# Patient Record
Sex: Female | Born: 1950
Health system: Southern US, Community
[De-identification: ages and names within clinical notes are randomized; demographics above are authoritative.]

## PROBLEM LIST (undated history)

## (undated) DIAGNOSIS — I2510 Atherosclerotic heart disease of native coronary artery without angina pectoris: Secondary | ICD-10-CM

## (undated) DIAGNOSIS — E785 Hyperlipidemia, unspecified: Secondary | ICD-10-CM

## (undated) DIAGNOSIS — Z720 Tobacco use: Secondary | ICD-10-CM

## (undated) DIAGNOSIS — I10 Essential (primary) hypertension: Secondary | ICD-10-CM

## (undated) DIAGNOSIS — I2119 ST elevation (STEMI) myocardial infarction involving other coronary artery of inferior wall: Secondary | ICD-10-CM

## (undated) DIAGNOSIS — Z951 Presence of aortocoronary bypass graft: Secondary | ICD-10-CM

## (undated) DIAGNOSIS — I5032 Chronic diastolic (congestive) heart failure: Secondary | ICD-10-CM

## (undated) DIAGNOSIS — K635 Polyp of colon: Secondary | ICD-10-CM

## (undated) DIAGNOSIS — E119 Type 2 diabetes mellitus without complications: Secondary | ICD-10-CM

## (undated) HISTORY — DX: ST elevation (STEMI) myocardial infarction involving other coronary artery of inferior wall: I21.19

## (undated) HISTORY — DX: Tobacco use: Z72.0

## (undated) HISTORY — DX: Presence of aortocoronary bypass graft: Z95.1

## (undated) HISTORY — DX: Hyperlipidemia, unspecified: E78.5

## (undated) HISTORY — DX: Chronic diastolic (congestive) heart failure: I50.32

## (undated) HISTORY — PX: ABDOMINAL HYSTERECTOMY: SHX81

## (undated) HISTORY — DX: Atherosclerotic heart disease of native coronary artery without angina pectoris: I25.10

## (undated) HISTORY — DX: Essential (primary) hypertension: I10

---

## 2002-10-24 ENCOUNTER — Other Ambulatory Visit: Admission: RE | Admit: 2002-10-24 | Discharge: 2002-10-24 | Payer: Self-pay | Admitting: Family Medicine

## 2004-09-05 ENCOUNTER — Encounter: Admission: RE | Admit: 2004-09-05 | Discharge: 2004-09-05 | Payer: Self-pay | Admitting: Occupational Medicine

## 2006-10-02 ENCOUNTER — Encounter: Admission: RE | Admit: 2006-10-02 | Discharge: 2006-10-02 | Payer: Self-pay | Admitting: Family Medicine

## 2016-11-27 ENCOUNTER — Encounter (HOSPITAL_COMMUNITY)
Admission: EM | Disposition: A | Discharge status: Home Health | Payer: Self-pay | Source: Home / Self Care | Attending: Cardiology

## 2016-11-27 ENCOUNTER — Inpatient Hospital Stay (HOSPITAL_COMMUNITY)
Admission: EM | Admit: 2016-11-27 | Discharge: 2016-12-08 | DRG: 233 | Disposition: A | Discharge status: Home Health | Payer: Medicaid Other | Attending: Thoracic Surgery (Cardiothoracic Vascular Surgery) | Admitting: Thoracic Surgery (Cardiothoracic Vascular Surgery)

## 2016-11-27 ENCOUNTER — Encounter (HOSPITAL_COMMUNITY): Payer: Self-pay

## 2016-11-27 ENCOUNTER — Emergency Department (HOSPITAL_COMMUNITY): Payer: Medicaid Other

## 2016-11-27 DIAGNOSIS — Z90710 Acquired absence of both cervix and uterus: Secondary | ICD-10-CM

## 2016-11-27 DIAGNOSIS — N951 Menopausal and female climacteric states: Secondary | ICD-10-CM | POA: Diagnosis present

## 2016-11-27 DIAGNOSIS — J9811 Atelectasis: Secondary | ICD-10-CM | POA: Diagnosis not present

## 2016-11-27 DIAGNOSIS — E785 Hyperlipidemia, unspecified: Secondary | ICD-10-CM

## 2016-11-27 DIAGNOSIS — Z8249 Family history of ischemic heart disease and other diseases of the circulatory system: Secondary | ICD-10-CM

## 2016-11-27 DIAGNOSIS — Z791 Long term (current) use of non-steroidal anti-inflammatories (NSAID): Secondary | ICD-10-CM | POA: Diagnosis not present

## 2016-11-27 DIAGNOSIS — Z86010 Personal history of colonic polyps: Secondary | ICD-10-CM

## 2016-11-27 DIAGNOSIS — K219 Gastro-esophageal reflux disease without esophagitis: Secondary | ICD-10-CM | POA: Diagnosis present

## 2016-11-27 DIAGNOSIS — I472 Ventricular tachycardia: Secondary | ICD-10-CM | POA: Diagnosis not present

## 2016-11-27 DIAGNOSIS — F17210 Nicotine dependence, cigarettes, uncomplicated: Secondary | ICD-10-CM | POA: Diagnosis not present

## 2016-11-27 DIAGNOSIS — I5032 Chronic diastolic (congestive) heart failure: Secondary | ICD-10-CM

## 2016-11-27 DIAGNOSIS — I25110 Atherosclerotic heart disease of native coronary artery with unstable angina pectoris: Secondary | ICD-10-CM | POA: Diagnosis not present

## 2016-11-27 DIAGNOSIS — E7800 Pure hypercholesterolemia, unspecified: Secondary | ICD-10-CM | POA: Diagnosis present

## 2016-11-27 DIAGNOSIS — J90 Pleural effusion, not elsewhere classified: Secondary | ICD-10-CM | POA: Diagnosis present

## 2016-11-27 DIAGNOSIS — N390 Urinary tract infection, site not specified: Secondary | ICD-10-CM | POA: Diagnosis not present

## 2016-11-27 DIAGNOSIS — Z720 Tobacco use: Secondary | ICD-10-CM

## 2016-11-27 DIAGNOSIS — E669 Obesity, unspecified: Secondary | ICD-10-CM | POA: Diagnosis present

## 2016-11-27 DIAGNOSIS — E876 Hypokalemia: Secondary | ICD-10-CM | POA: Diagnosis not present

## 2016-11-27 DIAGNOSIS — Z951 Presence of aortocoronary bypass graft: Secondary | ICD-10-CM

## 2016-11-27 DIAGNOSIS — I5031 Acute diastolic (congestive) heart failure: Secondary | ICD-10-CM | POA: Diagnosis not present

## 2016-11-27 DIAGNOSIS — Z7982 Long term (current) use of aspirin: Secondary | ICD-10-CM

## 2016-11-27 DIAGNOSIS — J449 Chronic obstructive pulmonary disease, unspecified: Secondary | ICD-10-CM | POA: Diagnosis present

## 2016-11-27 DIAGNOSIS — M25569 Pain in unspecified knee: Secondary | ICD-10-CM | POA: Diagnosis present

## 2016-11-27 DIAGNOSIS — Z716 Tobacco abuse counseling: Secondary | ICD-10-CM | POA: Diagnosis not present

## 2016-11-27 DIAGNOSIS — M810 Age-related osteoporosis without current pathological fracture: Secondary | ICD-10-CM | POA: Diagnosis present

## 2016-11-27 DIAGNOSIS — Z6832 Body mass index (BMI) 32.0-32.9, adult: Secondary | ICD-10-CM

## 2016-11-27 DIAGNOSIS — I110 Hypertensive heart disease with heart failure: Secondary | ICD-10-CM | POA: Diagnosis not present

## 2016-11-27 DIAGNOSIS — I4510 Unspecified right bundle-branch block: Secondary | ICD-10-CM | POA: Diagnosis present

## 2016-11-27 DIAGNOSIS — I2510 Atherosclerotic heart disease of native coronary artery without angina pectoris: Secondary | ICD-10-CM

## 2016-11-27 DIAGNOSIS — I213 ST elevation (STEMI) myocardial infarction of unspecified site: Secondary | ICD-10-CM

## 2016-11-27 DIAGNOSIS — I509 Heart failure, unspecified: Secondary | ICD-10-CM

## 2016-11-27 DIAGNOSIS — I2119 ST elevation (STEMI) myocardial infarction involving other coronary artery of inferior wall: Secondary | ICD-10-CM | POA: Diagnosis not present

## 2016-11-27 DIAGNOSIS — D62 Acute posthemorrhagic anemia: Secondary | ICD-10-CM | POA: Diagnosis not present

## 2016-11-27 DIAGNOSIS — I10 Essential (primary) hypertension: Secondary | ICD-10-CM | POA: Clinically undetermined

## 2016-11-27 DIAGNOSIS — I080 Rheumatic disorders of both mitral and aortic valves: Secondary | ICD-10-CM | POA: Diagnosis present

## 2016-11-27 DIAGNOSIS — I219 Acute myocardial infarction, unspecified: Secondary | ICD-10-CM

## 2016-11-27 HISTORY — DX: ST elevation (STEMI) myocardial infarction involving other coronary artery of inferior wall: I21.19

## 2016-11-27 HISTORY — DX: Atherosclerotic heart disease of native coronary artery without angina pectoris: I25.10

## 2016-11-27 HISTORY — PX: TRANSTHORACIC ECHOCARDIOGRAM: SHX275

## 2016-11-27 HISTORY — DX: Essential (primary) hypertension: I10

## 2016-11-27 HISTORY — DX: Polyp of colon: K63.5

## 2016-11-27 HISTORY — PX: LEFT HEART CATH AND CORONARY ANGIOGRAPHY: CATH118249

## 2016-11-27 HISTORY — DX: Chronic diastolic (congestive) heart failure: I50.32

## 2016-11-27 HISTORY — DX: Hyperlipidemia, unspecified: E78.5

## 2016-11-27 LAB — COMPREHENSIVE METABOLIC PANEL
ALBUMIN: 3.5 g/dL (ref 3.5–5.0)
ALT: 12 U/L — ABNORMAL LOW (ref 14–54)
AST: 23 U/L (ref 15–41)
Alkaline Phosphatase: 98 U/L (ref 38–126)
Anion gap: 6 (ref 5–15)
BUN: 12 mg/dL (ref 6–20)
CALCIUM: 8.8 mg/dL — AB (ref 8.9–10.3)
CHLORIDE: 105 mmol/L (ref 101–111)
CO2: 24 mmol/L (ref 22–32)
Creatinine, Ser: 0.73 mg/dL (ref 0.44–1.00)
GFR calc Af Amer: >60 mL/min (ref >60)
GFR calc non Af Amer: >60 mL/min (ref >60)
GLUCOSE: 175 mg/dL — AB (ref 65–99)
POTASSIUM: 3.3 mmol/L — AB (ref 3.5–5.1)
SODIUM: 135 mmol/L (ref 135–145)
TOTAL PROTEIN: 7.1 g/dL (ref 6.5–8.1)
Total Bilirubin: 0.2 mg/dL — ABNORMAL LOW (ref 0.3–1.2)

## 2016-11-27 LAB — POCT I-STAT, CHEM 8
BUN: 12 mg/dL (ref 6–20)
CALCIUM ION: 1.22 mmol/L (ref 1.15–1.40)
CHLORIDE: 103 mmol/L (ref 101–111)
Creatinine, Ser: 0.6 mg/dL (ref 0.44–1.00)
GLUCOSE: 168 mg/dL — AB (ref 65–99)
HCT: 37 % (ref 36.0–46.0)
Hemoglobin: 12.6 g/dL (ref 12.0–15.0)
Potassium: 3.3 mmol/L — ABNORMAL LOW (ref 3.5–5.1)
Sodium: 141 mmol/L (ref 135–145)
TCO2: 25 mmol/L (ref 22–32)

## 2016-11-27 LAB — LIPID PANEL
Cholesterol: 173 mg/dL (ref 0–200)
HDL: 46 mg/dL (ref >40)
LDL CALC: 107 mg/dL — AB (ref 0–99)
TRIGLYCERIDES: 100 mg/dL (ref <150)
Total CHOL/HDL Ratio: 3.8 RATIO
VLDL: 20 mg/dL (ref 0–40)

## 2016-11-27 LAB — CBC WITH DIFFERENTIAL/PLATELET
Basophils Absolute: 0 10*3/uL (ref 0.0–0.1)
Basophils Relative: 0 %
Eosinophils Absolute: 0.3 10*3/uL (ref 0.0–0.7)
Eosinophils Relative: 2 %
HEMATOCRIT: 39.5 % (ref 36.0–46.0)
HEMOGLOBIN: 13 g/dL (ref 12.0–15.0)
LYMPHS ABS: 4.9 10*3/uL — AB (ref 0.7–4.0)
Lymphocytes Relative: 33 %
MCH: 27.8 pg (ref 26.0–34.0)
MCHC: 32.9 g/dL (ref 30.0–36.0)
MCV: 84.6 fL (ref 78.0–100.0)
MONOS PCT: 6 %
Monocytes Absolute: 0.9 10*3/uL (ref 0.1–1.0)
NEUTROS ABS: 8.8 10*3/uL — AB (ref 1.7–7.7)
NEUTROS PCT: 59 %
Platelets: 295 10*3/uL (ref 150–400)
RBC: 4.67 MIL/uL (ref 3.87–5.11)
RDW: 14.6 % (ref 11.5–15.5)
WBC: 14.9 10*3/uL — ABNORMAL HIGH (ref 4.0–10.5)

## 2016-11-27 LAB — APTT: APTT: 29 s (ref 24–36)

## 2016-11-27 LAB — TROPONIN I

## 2016-11-27 LAB — PROTIME-INR
INR: 1.02
Prothrombin Time: 13.3 seconds (ref 11.4–15.2)

## 2016-11-27 SURGERY — LEFT HEART CATH AND CORONARY ANGIOGRAPHY
Anesthesia: LOCAL

## 2016-11-27 MED ORDER — NITROGLYCERIN IN D5W 200-5 MCG/ML-% IV SOLN
INTRAVENOUS | Status: AC | PRN
  Administered 2016-11-27: 5 ug/min via INTRAVENOUS

## 2016-11-27 MED ORDER — POTASSIUM CHLORIDE 20 MEQ/15ML (10%) PO SOLN
40.0000 meq | Freq: Once | ORAL | Status: AC
  Administered 2016-11-28: 40 meq via ORAL
  Filled 2016-11-27 (×2): qty 30

## 2016-11-27 MED ORDER — BIVALIRUDIN BOLUS VIA INFUSION - CUPID: INTRAVENOUS | Status: DC | PRN

## 2016-11-27 MED ORDER — SODIUM CHLORIDE 0.9 % IV SOLN: INTRAVENOUS | Status: AC

## 2016-11-27 MED ORDER — TIROFIBAN (AGGRASTAT) BOLUS VIA INFUSION
INTRAVENOUS | Status: DC | PRN
  Administered 2016-11-27: 2087.5 ug via INTRAVENOUS

## 2016-11-27 MED ORDER — SODIUM CHLORIDE 0.9 % IV SOLN
INTRAVENOUS | Status: DC
  Administered 2016-11-27: 21:00:00 via INTRAVENOUS

## 2016-11-27 MED ORDER — MIDAZOLAM HCL 2 MG/2ML IJ SOLN
INTRAMUSCULAR | Status: AC
  Filled 2016-11-27: qty 2

## 2016-11-27 MED ORDER — SODIUM CHLORIDE 0.9% FLUSH
3.0000 mL | Freq: Two times a day (BID) | INTRAVENOUS | Status: DC
  Administered 2016-11-28 – 2016-11-30 (×5): 3 mL via INTRAVENOUS

## 2016-11-27 MED ORDER — HEPARIN (PORCINE) IN NACL 100-0.45 UNIT/ML-% IJ SOLN
1350.0000 [IU]/h | INTRAMUSCULAR | Status: DC
  Administered 2016-11-28: 1200 [IU]/h via INTRAVENOUS
  Administered 2016-11-28: 1350 [IU]/h via INTRAVENOUS
  Filled 2016-11-27 (×4): qty 250

## 2016-11-27 MED ORDER — HEPARIN SODIUM (PORCINE) 5000 UNIT/ML IJ SOLN
INTRAMUSCULAR | Status: AC
  Filled 2016-11-27: qty 1

## 2016-11-27 MED ORDER — ONDANSETRON HCL 4 MG/2ML IJ SOLN
4.0000 mg | Freq: Once | INTRAMUSCULAR | Status: AC
  Administered 2016-11-27: 4 mg via INTRAVENOUS

## 2016-11-27 MED ORDER — ONDANSETRON HCL 4 MG/2ML IJ SOLN
INTRAMUSCULAR | Status: AC
  Filled 2016-11-27: qty 2

## 2016-11-27 MED ORDER — TIROFIBAN HCL IN NACL 5-0.9 MG/100ML-% IV SOLN
INTRAVENOUS | Status: AC
  Filled 2016-11-27: qty 100

## 2016-11-27 MED ORDER — LIDOCAINE HCL (PF) 1 % IJ SOLN
INTRAMUSCULAR | Status: DC | PRN
  Administered 2016-11-27: 2 mL

## 2016-11-27 MED ORDER — ASPIRIN 81 MG PO CHEW
81.0000 mg | CHEWABLE_TABLET | Freq: Every day | ORAL | Status: DC
  Administered 2016-11-28 – 2016-12-01 (×4): 81 mg via ORAL
  Filled 2016-11-27 (×4): qty 1

## 2016-11-27 MED ORDER — ATORVASTATIN CALCIUM 80 MG PO TABS
80.0000 mg | ORAL_TABLET | Freq: Every day | ORAL | Status: DC
  Administered 2016-11-28 – 2016-12-07 (×9): 80 mg via ORAL
  Filled 2016-11-27 (×9): qty 1

## 2016-11-27 MED ORDER — VERAPAMIL HCL 2.5 MG/ML IV SOLN
INTRAVENOUS | Status: DC | PRN
  Administered 2016-11-27: 10 mL via INTRA_ARTERIAL

## 2016-11-27 MED ORDER — FENTANYL CITRATE (PF) 100 MCG/2ML IJ SOLN
INTRAMUSCULAR | Status: AC
  Filled 2016-11-27: qty 2

## 2016-11-27 MED ORDER — SODIUM CHLORIDE 0.9 % IV SOLN: 250.0000 mL | INTRAVENOUS | Status: DC | PRN

## 2016-11-27 MED ORDER — SODIUM CHLORIDE 0.9% FLUSH: 3.0000 mL | INTRAVENOUS | Status: DC | PRN

## 2016-11-27 MED ORDER — IOPAMIDOL (ISOVUE-370) INJECTION 76%
INTRAVENOUS | Status: AC
  Filled 2016-11-27: qty 100

## 2016-11-27 MED ORDER — NITROGLYCERIN IN D5W 200-5 MCG/ML-% IV SOLN
0.0000 ug/min | INTRAVENOUS | Status: DC
  Administered 2016-11-28: 90 ug/min via INTRAVENOUS
  Administered 2016-11-28: 100 ug/min via INTRAVENOUS
  Administered 2016-11-29: 80 ug/min via INTRAVENOUS
  Administered 2016-11-29: 70 ug/min via INTRAVENOUS
  Administered 2016-11-30 (×2): 80 ug/min via INTRAVENOUS
  Filled 2016-11-27 (×6): qty 250

## 2016-11-27 MED ORDER — HEPARIN (PORCINE) IN NACL 2-0.9 UNIT/ML-% IJ SOLN
INTRAMUSCULAR | Status: AC
  Filled 2016-11-27: qty 1000

## 2016-11-27 MED ORDER — NITROGLYCERIN IN D5W 200-5 MCG/ML-% IV SOLN
INTRAVENOUS | Status: AC
  Filled 2016-11-27: qty 250

## 2016-11-27 MED ORDER — FENTANYL CITRATE (PF) 100 MCG/2ML IJ SOLN
INTRAMUSCULAR | Status: DC | PRN
  Administered 2016-11-27: 50 ug via INTRAVENOUS

## 2016-11-27 MED ORDER — IOPAMIDOL (ISOVUE-370) INJECTION 76%
INTRAVENOUS | Status: AC
  Filled 2016-11-27: qty 125

## 2016-11-27 MED ORDER — ACETAMINOPHEN 325 MG PO TABS
650.0000 mg | ORAL_TABLET | ORAL | Status: DC | PRN
  Administered 2016-11-29 – 2016-12-01 (×4): 650 mg via ORAL
  Filled 2016-11-27 (×4): qty 2

## 2016-11-27 MED ORDER — CARVEDILOL 3.125 MG PO TABS
3.1250 mg | ORAL_TABLET | Freq: Two times a day (BID) | ORAL | Status: DC
  Administered 2016-11-28: 3.125 mg via ORAL
  Filled 2016-11-27: qty 1

## 2016-11-27 MED ORDER — FENTANYL CITRATE (PF) 100 MCG/2ML IJ SOLN
50.0000 ug | Freq: Once | INTRAMUSCULAR | Status: AC
  Administered 2016-11-27: 50 ug via INTRAVENOUS

## 2016-11-27 MED ORDER — VERAPAMIL HCL 2.5 MG/ML IV SOLN
INTRAVENOUS | Status: AC
  Filled 2016-11-27: qty 2

## 2016-11-27 MED ORDER — IOPAMIDOL (ISOVUE-370) INJECTION 76%
INTRAVENOUS | Status: DC | PRN
  Administered 2016-11-27: 75 mL via INTRAVENOUS

## 2016-11-27 MED ORDER — HEPARIN (PORCINE) IN NACL 2-0.9 UNIT/ML-% IJ SOLN
INTRAMUSCULAR | Status: AC | PRN
Start: 2016-11-27 — End: 2016-11-27
  Administered 2016-11-27: 1500 mL

## 2016-11-27 MED ORDER — TIROFIBAN HCL IN NACL 5-0.9 MG/100ML-% IV SOLN
INTRAVENOUS | Status: AC | PRN
  Administered 2016-11-27: 0.15 ug/kg/min via INTRAVENOUS

## 2016-11-27 MED ORDER — HEPARIN SODIUM (PORCINE) 5000 UNIT/ML IJ SOLN
60.0000 [IU]/kg | Freq: Once | INTRAMUSCULAR | Status: AC
  Administered 2016-11-27: 4000 [IU] via INTRAVENOUS

## 2016-11-27 MED ORDER — TIROFIBAN HCL IN NACL 5-0.9 MG/100ML-% IV SOLN
0.1500 ug/kg/min | INTRAVENOUS | Status: AC
  Administered 2016-11-28 (×3): 0.15 ug/kg/min via INTRAVENOUS
  Filled 2016-11-27 (×3): qty 100

## 2016-11-27 MED ORDER — HEPARIN (PORCINE) IN NACL 2-0.9 UNIT/ML-% IJ SOLN
INTRAMUSCULAR | Status: AC
  Filled 2016-11-27: qty 500

## 2016-11-27 MED ORDER — FUROSEMIDE 10 MG/ML IJ SOLN
40.0000 mg | Freq: Once | INTRAMUSCULAR | Status: AC
  Administered 2016-11-28: 40 mg via INTRAVENOUS
  Filled 2016-11-27: qty 4

## 2016-11-27 MED ORDER — LIDOCAINE HCL 2 % IJ SOLN
INTRAMUSCULAR | Status: AC
  Filled 2016-11-27: qty 10

## 2016-11-27 MED ORDER — NITROGLYCERIN 1 MG/10 ML FOR IR/CATH LAB
INTRA_ARTERIAL | Status: AC
  Filled 2016-11-27: qty 10

## 2016-11-27 MED ORDER — ONDANSETRON HCL 4 MG/2ML IJ SOLN
4.0000 mg | Freq: Four times a day (QID) | INTRAMUSCULAR | Status: DC | PRN
  Administered 2016-11-28 – 2016-11-30 (×2): 4 mg via INTRAVENOUS
  Filled 2016-11-27 (×2): qty 2

## 2016-11-27 MED ORDER — MIDAZOLAM HCL 2 MG/2ML IJ SOLN
INTRAMUSCULAR | Status: DC | PRN
  Administered 2016-11-27: 1 mg via INTRAVENOUS

## 2016-11-27 SURGICAL SUPPLY — 12 items
CATH EXPO 5F FL3.5 (CATHETERS) ×2 IMPLANT
CATH INFINITI 5FR ANG PIGTAIL (CATHETERS) ×2 IMPLANT
CATH OPTITORQUE TIG 4.0 5F (CATHETERS) ×2 IMPLANT
DEVICE RAD COMP TR BAND LRG (VASCULAR PRODUCTS) ×2 IMPLANT
GLIDESHEATH SLEND A-KIT 6F 22G (SHEATH) ×2 IMPLANT
GUIDEWIRE INQWIRE 1.5J.035X260 (WIRE) ×1 IMPLANT
INQWIRE 1.5J .035X260CM (WIRE) ×2
KIT ENCORE 26 ADVANTAGE (KITS) ×2 IMPLANT
KIT HEART LEFT (KITS) ×2 IMPLANT
PACK CARDIAC CATHETERIZATION (CUSTOM PROCEDURE TRAY) ×2 IMPLANT
TRANSDUCER W/STOPCOCK (MISCELLANEOUS) ×2 IMPLANT
TUBING CIL FLEX 10 FLL-RA (TUBING) ×2 IMPLANT

## 2016-11-27 NOTE — ED Notes (Signed)
EKG in going from elevation to no elevation while cardiology speaking with pt.

## 2016-11-27 NOTE — ED Provider Notes (Signed)
[facility] DEPT Provider Note   CSN: [id number] Arrival date & time: 11/27/16  2109     History   Chief Complaint Chief Complaint  Patient presents with  . Chest Pain    HPI [name] is a 66 y.o. female.  This is a 66 year old female who denies any PMH and does not see a doctor who presents with sudden onset of precordial chest pain which began during her drive home from work today approximately 3-4 hours earlier.  EMS was called and they gave 324 aspirin and one nitroglycerin which made her more nauseous.  She endorses shortness of breath, nonradiating chest pain, diaphoresis.  Initial EKG apparently showed nonspecific repolarization abnormalities however upon arrival to the ED her EKG showed ST segment elevation in inferior leads concerning for STEMI. Patient endorses 1 pack per day of smoking, denies illicit drug use, denies recent travel, leg swelling, neurological deficits.   The history is provided by the patient and the EMS personnel. The history is limited by the condition of the patient.    History reviewed. No pertinent past medical history.  Patient Active Problem List   Diagnosis Date Noted  . Acute ST elevation myocardial infarction (STEMI) of inferior wall ([place]) 11/27/2016    History reviewed. No pertinent surgical history.  OB History    No data available       Home Medications    Prior to Admission medications   Not on File    Family History History reviewed. No pertinent family history.  Social History Social History  Substance Use Topics  . Smoking status: Not on file  . Smokeless tobacco: Not on file  . Alcohol use Not on file     Allergies   Patient has no known allergies.   Review of Systems Review of Systems  Unable to perform ROS: Acuity of condition     Physical Exam Updated Vital Signs BP 122/76   Pulse 83   Temp (!) 96.1 F (35.6 C) (Temporal)   Resp 15   Wt 83.5 kg (184 lb)   SpO2 93%   Physical Exam   Constitutional: She appears well-developed and well-nourished. She has a sickly appearance. She appears distressed. Nasal cannula in place.  HENT:  Head: Normocephalic and atraumatic.  Eyes: Conjunctivae are normal.  Neck: Neck supple.  Cardiovascular: Normal rate, regular rhythm, normal heart sounds and intact distal pulses.   No murmur heard. Pulmonary/Chest: Effort normal and breath sounds normal. No respiratory distress.  Abdominal: Soft. There is no tenderness.  Musculoskeletal: She exhibits no edema.  Neurological: She is alert. She has normal strength. No sensory deficit.  Skin: Skin is warm. She is diaphoretic.  Psychiatric: Her mood appears anxious.  Nursing note and vitals reviewed.    ED Treatments / Results  Labs (all labs ordered are listed, but only abnormal results are displayed) Labs Reviewed  CBC WITH DIFFERENTIAL/PLATELET  PROTIME-INR  APTT  COMPREHENSIVE METABOLIC PANEL  TROPONIN I  LIPID PANEL    EKG  EKG Interpretation None       Radiology No results found.  Procedures Procedures (including critical care time)  Medications Ordered in ED Medications  0.9 %  sodium chloride infusion ( Intravenous New Bag/Given 11/27/16 2116)  heparin 5000 UNIT/ML injection (not administered)  fentaNYL (SUBLIMAZE) 100 MCG/2ML injection (not administered)  ondansetron (ZOFRAN) 4 MG/2ML injection (not administered)  heparin injection 60 Units/kg (4,000 Units Intravenous Given 11/27/16 2117)  ondansetron (ZOFRAN) injection 4 mg (4 mg Intravenous Given 11/27/16  2118)  fentaNYL (SUBLIMAZE) injection 50 mcg (50 mcg Intravenous Given 11/27/16 2118)     Initial Impression / Assessment and Plan / ED Course  I have reviewed the triage vital signs and the nursing notes.  Pertinent labs & imaging results that were available during my care of the patient were reviewed by me and considered in my medical decision making (see chart for details).     This is a 66 year old  female who denies any PMH and does not see a doctor who presents with sudden onset of precordial chest pain which began during her drive home from work today approximately 3-4 hours earlier.    Repeat 12-lead EKG reviewed upon arrival in the ED which concerning for acute STEMI with elevation in leads II, 3, aVF with reciprocal depression in aVL, V2. Mild prolonging the PR interval, no evidence of third degree heart block.  Heparin bolus started, 1 L saline started.  Cardiology consulted and patient was emergently taken to Cath lab.   Spoke with son on arrival and all questions answered.  Final Clinical Impressions(s) / ED Diagnoses   Final diagnoses:  None    New Prescriptions New Prescriptions   No medications on file     Aldona Lento, MD 11/27/16 2236    Little, Wenda Overland, MD 11/28/16 [phone]

## 2016-11-27 NOTE — H&P (Signed)
Patient ID: [name] MRN: [id number], DOB/AGE: [birth year]   Admit date: 11/27/2016   Primary Physician: No primary care provider on file. Primary Cardiologist: None  Pt. Profile: 66 yo female w/ no significant medical history who presents with inferior STEMI, found to have 2 vessel disease.  Problem List  Past Medical History:  Diagnosis Date  . Colon polyps     Past Surgical History:  Procedure Laterality Date  . ABDOMINAL HYSTERECTOMY       Allergies  No Known Allergies  HPI [name] was in her usual state of health when she was returning from work in her car. She suddenly felt nauseous, weak with sweating and some chest pain. EMS was called and they gave her an ASA and SLN. The EKG strips showed inferior ST elevation, that resolved when she arrived to the hospital.  She reports being able to walk up a flight of stairs, only limited by her knee pain. No PND or orthopnea. No prior history of MI, cardiac surgery. Smokes 1ppd x 42 years   Home Medications  Prior to Admission medications   Not on File    Family History  Family History  Problem Relation Age of Onset  . Heart disease Brother     Social History  Social History   Social History  . Marital status: Unknown    Spouse name: N/A  . Number of children: N/A  . Years of education: N/A   Occupational History  . Not on file.   Social History Main Topics  . Smoking status: Current Every Day Smoker    Packs/day: 1.00    Years: 42.00    Types: Cigarettes  . Smokeless tobacco: Never Used  . Alcohol use No  . Drug use: No  . Sexual activity: Not on file   Other Topics Concern  . Not on file   Social History Narrative  . No narrative on file     Review of Systems General:  No chills, fever, night sweats or weight changes.  Cardiovascular:  No chest pain, dyspnea on exertion, edema, orthopnea, palpitations, paroxysmal nocturnal dyspnea. Dermatological: No rash,  lesions/masses Respiratory: No cough, dyspnea Urologic: No hematuria, dysuria Abdominal:   No nausea, vomiting, diarrhea, bright red blood per rectum, melena, or hematemesis Neurologic:  No visual changes, wkns, changes in mental status. All other systems reviewed and are otherwise negative except as noted above.  Physical Exam  Blood pressure (!) 153/99, pulse 85, temperature (!) 96.1 F (35.6 C), temperature source Temporal, resp. rate 19, height 5\' 7"  (1.702 m), weight 93.7 kg (206 lb 9.1 oz), SpO2 95 %.  General: Pleasant, NAD Psych: Normal affect. Neuro: Alert and oriented X 3. Moves all extremities spontaneously. HEENT: Normal  Neck: Supple without bruits or JVD. Lungs:  Resp regular and unlabored, CTA. Heart: RRR no s3, s4, II/VI systolic murmur at RUSB Abdomen: Soft, non-tender, non-distended, BS + x 4.  Extremities: No clubbing, cyanosis or edema. DP/PT/Radials 2+ and equal bilaterally.  Labs  Troponin (Point of Care Test) No results for input(s): TROPIPOC in the last 72 hours.  Recent Labs  11/27/16 2115  TROPONINI <0.03   Lab Results  Component Value Date   WBC 14.9 (H) 11/27/2016   HGB 12.6 11/27/2016   HCT 37.0 11/27/2016   MCV 84.6 11/27/2016   PLT 295 11/27/2016    Recent Labs Lab 11/27/16 2115 11/27/16 2202  NA 135 141  K 3.3* 3.3*  CL 105 103  CO2  24  --   BUN 12 12  CREATININE 0.73 0.60  CALCIUM 8.8*  --   PROT 7.1  --   BILITOT 0.2*  --   ALKPHOS 98  --   ALT 12*  --   AST 23  --   GLUCOSE 175* 168*   Lab Results  Component Value Date   CHOL 173 11/27/2016   HDL 46 11/27/2016   LDLCALC 107 (H) 11/27/2016   TRIG 100 11/27/2016   No results found for: DDIMER   Radiology/Studies  Dg Chest Port 1 View  Result Date: 11/27/2016 CLINICAL DATA:  STEMI; chest pain, N/V EXAM: PORTABLE CHEST 1 VIEW COMPARISON:  None. FINDINGS: Cardiomegaly. Lungs are clear. No pleural effusion or pneumothorax seen. Osseous structures about the chest are  unremarkable. IMPRESSION: 1. Cardiomegaly. 2. Lungs are clear.  No evidence of CHF. Electronically Signed   By: Franki Cabot M.D.   On: 11/27/2016 21:29    ECG Inferior STEMI, resolved on repeat  Echocardiogram  Pending    LHC:   Prox RCA-2 lesion, 60 %stenosed. Prox-mid RCA-1 lesion, 55 %stenosed.   Dist RCA-2 lesion, 95 %stenosed. Dist RCA-1 lesion, 90 %stenosed. - This combination is likely culprit lesion, however with very tortuous RCA in a stable patient did not feel it was a good idea to attempt PTCA at this time.   Ost LAD lesion, 65 %stenosed. Mid LAD lesion, 90 %stenosed.   Ost 1st Diag to 1st Diag lesion, 50 %stenosed. 1st Diag lesion, 60 %stenosed.   Dist LAD lesion, 50 %stenosed. - This is beyond the 2nd Diag branch.   2nd Mrg lesion, 60 %stenosed.   The left ventricular systolic function is normal. The left ventricular ejection fraction is 55-65% by visual estimate.   LV end diastolic pressure is severely elevated. 32-35 mmhg   There is moderate (3+) mitral regurgitation.  ASSESSMENT AND PLAN 66 yo female w/ history of tobacco use who presented with a inferior STEMI with resolution of ST segment on repeat  # Inferior ST: on presentation, found to have at least 2 vessel disease not immediately amendable to PCI. Risk factors include smoking and family history (reports heart disease in relatively young family members) - aggrastat x 18 hours - restart heparin gtt after 8hrs from sheath removal - lasix 40mg  IV x 1 for LVEDP >30 - Nitro gtt  - Coreg 3.125mg  - Atorva 80mg  - start ACE-I tomorrow - consult CT surgery for evaluation - f/u lipid + A1c - TTE  # Tobacco use - will need counseling   Signed, Charlies Silvers, MD

## 2016-11-27 NOTE — ED Triage Notes (Signed)
Pt on way from work and feeling bad, pulled over and called 911, sob and cp, non radiating, diaphoretic, speaking in complete sentences, initial 12 lead bundle branch, given 324 asa and 1 ntg with ems. Emesis x 1 pta by ems and 1 when pulling in to ed. 12 lead on arrival to ed shows elevation. 129/79, 78, 99% 2 l, rr16

## 2016-11-27 NOTE — Progress Notes (Signed)
   11/27/16 2200  Clinical Encounter Type  Visited With Patient  Visit Type Code  Referral From Nurse  Consult/Referral To Chaplain  Spiritual Encounters  Spiritual Needs Other (Comment) (initial visit with code Stemi)  Stress Factors  Patient Stress Factors Health changes

## 2016-11-27 NOTE — ED Notes (Signed)
1 bag of clothing and pocket book with pt, pt glasses placed in pocket book

## 2016-11-27 NOTE — Progress Notes (Signed)
ANTICOAGULATION CONSULT NOTE - Initial Consult  Pharmacy Consult for Heparin Indication: post-cath  No Known Allergies  Patient Measurements: Weight: 184 lb (83.5 kg) Heparin Dosing Weight:   Vital Signs: Temp: 96.1 F (35.6 C) (09/20 2121) Temp Source: Temporal (09/20 2121) BP: 135/70 (09/20 2239) Pulse Rate: 84 (09/20 2239)  Labs:  Recent Labs  11/27/16 2115  HGB 13.0  HCT 39.5  PLT 295  APTT 29  LABPROT 13.3  INR 1.02  CREATININE 0.73  TROPONINI <0.03    CrCl cannot be calculated (Unknown ideal weight.).   Medical History: History reviewed. No pertinent past medical history.   Assessment:  CC/HPI: CP>STEMI  PMH: tobacco,  Anticoag: STEMI. Heparin post-cath. Radial sheath removed 2232. Baseline CBC and INR WNL.  Goal of Therapy:  Heparin level 0.3-0.7 units/ml Monitor platelets by anticoagulation protocol: Yes   Plan:  Obtain height /weight Start IV heparin in AM (0630) at 1200 units/hr-no bolus post-cath Check heparin level 6 hrs after heparin starts Daily HL and CBC  Jahzaria Vary S. Alford Highland, PharmD, BCPS Clinical Staff Pharmacist Pager [phone]  Eilene Ghazi Stillinger 11/27/2016,10:54 PM

## 2016-11-28 ENCOUNTER — Inpatient Hospital Stay (HOSPITAL_COMMUNITY): Payer: Medicaid Other

## 2016-11-28 ENCOUNTER — Encounter (HOSPITAL_COMMUNITY): Payer: Self-pay | Admitting: Cardiology

## 2016-11-28 ENCOUNTER — Other Ambulatory Visit: Payer: Self-pay | Admitting: *Deleted

## 2016-11-28 DIAGNOSIS — I5031 Acute diastolic (congestive) heart failure: Secondary | ICD-10-CM

## 2016-11-28 DIAGNOSIS — I350 Nonrheumatic aortic (valve) stenosis: Secondary | ICD-10-CM

## 2016-11-28 DIAGNOSIS — E7800 Pure hypercholesterolemia, unspecified: Secondary | ICD-10-CM

## 2016-11-28 DIAGNOSIS — I25110 Atherosclerotic heart disease of native coronary artery with unstable angina pectoris: Secondary | ICD-10-CM

## 2016-11-28 DIAGNOSIS — Z720 Tobacco use: Secondary | ICD-10-CM

## 2016-11-28 DIAGNOSIS — I2510 Atherosclerotic heart disease of native coronary artery without angina pectoris: Secondary | ICD-10-CM

## 2016-11-28 DIAGNOSIS — I10 Essential (primary) hypertension: Secondary | ICD-10-CM

## 2016-11-28 DIAGNOSIS — I213 ST elevation (STEMI) myocardial infarction of unspecified site: Secondary | ICD-10-CM

## 2016-11-28 LAB — SPIROMETRY WITH GRAPH
FEF 25-75 PRE: 0.77 L/s
FEF2575-%Pred-Pre: 36 %
FEV1-%Pred-Pre: 35 %
FEV1-PRE: 0.81 L
FEV1FVC-%Pred-Pre: 101 %
FEV6-%Pred-Pre: 36 %
FEV6-Pre: 1.01 L
FEV6FVC-%PRED-PRE: 103 %
FVC-%PRED-PRE: 34 %
FVC-Pre: 1.01 L
Pre FEV1/FVC ratio: 80 %
Pre FEV6/FVC Ratio: 100 %

## 2016-11-28 LAB — CBC
HEMATOCRIT: 37.5 % (ref 36.0–46.0)
HEMOGLOBIN: 12.1 g/dL (ref 12.0–15.0)
MCH: 27.2 pg (ref 26.0–34.0)
MCHC: 32.3 g/dL (ref 30.0–36.0)
MCV: 84.3 fL (ref 78.0–100.0)
Platelets: 297 10*3/uL (ref 150–400)
RBC: 4.45 MIL/uL (ref 3.87–5.11)
RDW: 14.6 % (ref 11.5–15.5)
WBC: 16.2 10*3/uL — AB (ref 4.0–10.5)

## 2016-11-28 LAB — ECHOCARDIOGRAM COMPLETE
AVPHT: 279 ms
Ao-asc: 40 cm
E decel time: 151 msec
FS: 33 % (ref 28–44)
Height: 67 in
IVS/LV PW RATIO, ED: 0.85
LA diam end sys: 32 mm
LA diam index: 1.56 cm/m2
LA vol A4C: 42.8 ml
LA vol index: 19.1 mL/m2
LA vol: 39.1 mL
LASIZE: 32 mm
LDCA: 3.8 cm2
LV TDI E'LATERAL: 7.29
LV TDI E'MEDIAL: 7.51
LV e' LATERAL: 7.29 cm/s
LVOT SV: 85 mL
LVOT VTI: 22.4 cm
LVOT peak grad rest: 7 mmHg
LVOTD: 22 mm
LVOTPV: 131 cm/s
MV Dec: 151
MV pk E vel: 0.9 m/s
PW: 13 mm — AB (ref 0.6–1.1)
RV LATERAL S' VELOCITY: 10.9 cm/s
RV TAPSE: 20.4 mm
Weight: 3305.14 oz

## 2016-11-28 LAB — BASIC METABOLIC PANEL
ANION GAP: 6 (ref 5–15)
BUN: 11 mg/dL (ref 6–20)
CO2: 26 mmol/L (ref 22–32)
Calcium: 8.9 mg/dL (ref 8.9–10.3)
Chloride: 106 mmol/L (ref 101–111)
Creatinine, Ser: 0.66 mg/dL (ref 0.44–1.00)
GFR calc Af Amer: >60 mL/min (ref >60)
GLUCOSE: 143 mg/dL — AB (ref 65–99)
POTASSIUM: 3.5 mmol/L (ref 3.5–5.1)
Sodium: 138 mmol/L (ref 135–145)

## 2016-11-28 LAB — HEMOGLOBIN A1C
HEMOGLOBIN A1C: 6 % — AB (ref 4.8–5.6)
MEAN PLASMA GLUCOSE: 125.5 mg/dL

## 2016-11-28 LAB — MRSA PCR SCREENING: MRSA BY PCR: NEGATIVE

## 2016-11-28 LAB — HEPARIN LEVEL (UNFRACTIONATED)
HEPARIN UNFRACTIONATED: 0.34 [IU]/mL (ref 0.30–0.70)
Heparin Unfractionated: 0.25 IU/mL — ABNORMAL LOW (ref 0.30–0.70)

## 2016-11-28 LAB — TROPONIN I
TROPONIN I: 0.11 ng/mL — AB (ref <0.03)
Troponin I: 1 ng/mL (ref <0.03)

## 2016-11-28 MED ORDER — HYDRALAZINE HCL 20 MG/ML IJ SOLN
10.0000 mg | Freq: Four times a day (QID) | INTRAMUSCULAR | Status: DC | PRN
  Administered 2016-11-28 – 2016-11-30 (×2): 10 mg via INTRAVENOUS
  Filled 2016-11-28 (×2): qty 1

## 2016-11-28 MED ORDER — LOSARTAN POTASSIUM 50 MG PO TABS
50.0000 mg | ORAL_TABLET | Freq: Every day | ORAL | Status: DC
  Administered 2016-11-28 – 2016-11-29 (×2): 50 mg via ORAL
  Filled 2016-11-28 (×2): qty 1

## 2016-11-28 MED ORDER — CARVEDILOL 6.25 MG PO TABS
6.2500 mg | ORAL_TABLET | Freq: Two times a day (BID) | ORAL | Status: DC
Start: 2016-11-28 — End: 2016-11-28

## 2016-11-28 MED ORDER — HEPARIN (PORCINE) IN NACL 100-0.45 UNIT/ML-% IJ SOLN
1650.0000 [IU]/h | INTRAMUSCULAR | Status: DC
  Administered 2016-11-29 (×2): 1400 [IU]/h via INTRAVENOUS
  Filled 2016-11-28 (×2): qty 250

## 2016-11-28 MED ORDER — CARVEDILOL 12.5 MG PO TABS
12.5000 mg | ORAL_TABLET | Freq: Two times a day (BID) | ORAL | Status: DC
  Administered 2016-11-28 – 2016-11-29 (×3): 12.5 mg via ORAL
  Filled 2016-11-28 (×4): qty 1

## 2016-11-28 NOTE — Consult Note (Signed)
[address]       [place] [address]             [phone]        [name] [facility] Medical Record #[id number] Date of Birth: 05/02/1950  Referring: Dr. Ellyn Hack Primary Care: No primary care provider on file.  Chief Complaint:    Chief Complaint  Patient presents with  . Chest Pain    History of Present Illness:     [name] is a 66 year old female with a past medical history of essential hypertension and hyperlipidemia who was returning to her car after work when she suddenly felt nauseous, weak, diaphoretic, and had some chest pain. EMS was called and they gave her aspirin and sublingual nitroglycerin. They obtained an EKG which showed inferior ST elevation which resolved when she arrived at the hospital. She did not report any orthopnea or PND. She has no prior cardiac history. She does smoke 1 pack a day 42 years. She does not have any other medical issues that she is aware of. She is on heparin gtt, nitro gtt, and aggrastat gtt. Patient's history is limited due to the patient going in and out of sleep. No current chest pain. We were consulted for possible surgical revascularization.    Current Activity/ Functional Status: Patient was independent with mobility/ambulation, transfers, ADL's, IADL's.   Zubrod Score: At the time of surgery this patient's most appropriate activity status/level should be described as: []     0    Normal activity, no symptoms [x]     1    Restricted in physical strenuous activity but ambulatory, able to do out light work []     2    Ambulatory and capable of self care, unable to do work activities, up and about                 more than 50%  Of the time                            []     3    Only limited self care, in bed greater than 50% of waking hours []     4    Completely disabled, no self care, confined to bed or chair []     5    Moribund  Past Medical History:  Diagnosis Date  . Colon polyps     Past  Surgical History:  Procedure Laterality Date  . ABDOMINAL HYSTERECTOMY    . LEFT HEART CATH AND CORONARY ANGIOGRAPHY N/A 11/27/2016   Procedure: LEFT HEART CATH AND CORONARY ANGIOGRAPHY;  Surgeon: Leonie Man, MD;  Location: [facility];  Service: Cardiovascular;  Laterality: N/A;    History  Smoking Status  . Current Every Day Smoker  . Packs/day: 1.00  . Years: 42.00  . Types: Cigarettes  Smokeless Tobacco  . Never Used    History  Alcohol Use No    Social History   Social History  . Marital status: Unknown    Spouse name: N/A  . Number of children: N/A  . Years of education: N/A   Occupational History  . Not on file.   Social History Main Topics  . Smoking status: Current Every Day Smoker    Packs/day: 1.00    Years: 42.00    Types: Cigarettes  . Smokeless tobacco: Never Used  . Alcohol use No  . Drug use:  No  . Sexual activity: Not on file   Other Topics Concern  . Not on file   Social History Narrative  . No narrative on file    No Known Allergies  Current Facility-Administered Medications  Medication Dose Route Frequency Provider Last Rate Last Dose  . 0.9 %  sodium chloride infusion   Intravenous Continuous Little, Wenda Overland, MD 10 mL/hr at 11/28/16 0700    . 0.9 %  sodium chloride infusion  250 mL Intravenous PRN Leonie Man, MD      . acetaminophen (TYLENOL) tablet 650 mg  650 mg Oral Q4H PRN Leonie Man, MD      . aspirin chewable tablet 81 mg  81 mg Oral Daily Leonie Man, MD   81 mg at 11/28/16 0630  . atorvastatin (LIPITOR) tablet 80 mg  80 mg Oral q1800 Leonie Man, MD      . carvedilol (COREG) tablet 12.5 mg  12.5 mg Oral BID WC Martinique, Peter M, MD   12.5 mg at 11/28/16 1601  . heparin ADULT infusion 100 units/mL (25000 units/225mL sodium chloride 0.45%)  1,200 Units/hr Intravenous Continuous Karren Cobble, RPH 12 mL/hr at 11/28/16 1000 1,200 Units/hr at 11/28/16 1000  . hydrALAZINE (APRESOLINE)  injection 10 mg  10 mg Intravenous Q6H PRN Martinique, Peter M, MD   10 mg at 11/28/16 1250  . losartan (COZAAR) tablet 50 mg  50 mg Oral Daily Martinique, Peter M, MD   50 mg at 11/28/16 1250  . nitroGLYCERIN 50 mg in dextrose 5 % 250 mL (0.2 mg/mL) infusion  0-200 mcg/min Intravenous Titrated Leonie Man, MD 22.5 mL/hr at 11/28/16 1400 75 mcg/min at 11/28/16 1400  . ondansetron (ZOFRAN) injection 4 mg  4 mg Intravenous Q6H PRN Leonie Man, MD   4 mg at 11/28/16 1119  . sodium chloride flush (NS) 0.9 % injection 3 mL  3 mL Intravenous Q12H Leonie Man, MD   3 mL at 11/28/16 0958  . sodium chloride flush (NS) 0.9 % injection 3 mL  3 mL Intravenous PRN Leonie Man, MD      . tirofiban (AGGRASTAT) infusion 50 mcg/mL 100 mL  0.15 mcg/kg/min Intravenous Continuous Laren Everts, RPH 16.9 mL/hr at 11/28/16 1249 0.15 mcg/kg/min at 11/28/16 1249    Prescriptions Prior to Admission  Medication Sig Dispense Refill Last Dose  . aspirin EC 81 MG tablet Take 81 mg by mouth daily as needed for moderate pain.   Past Month at Unknown time  . ibuprofen (ADVIL,MOTRIN) 200 MG tablet Take 200 mg by mouth every 6 (six) hours as needed for headache or moderate pain.   11/27/2016 at Unknown time    Family History  Problem Relation Age of Onset  . Heart disease Brother      Review of Systems:  Pertinent items are noted in HPI.     Cardiac Review of Systems: Y or N  Chest Pain [  Y  ]  Resting SOB [ N  ] Exertional SOB  [ N ]  Orthopnea Aqua.Slicker  ]   Pedal Edema [ N  ]    Palpitations [  ] Syncope  [ N ]   Presyncope [   ]  General Review of Systems: [Y] = yes [  ]=no Constitional: recent weight change [  ]; anorexia [  ]; fatigue [ Y ]; nausea [ Y ]; night sweats [  ]; fever Aqua.Slicker  ]; or chills [  ]  Dental: poor dentition[  ]; Last Dentist visit:   Eye : blurred vision [  ]; diplopia [   ]; vision changes [  ];  Amaurosis fugax[  ]; Resp:  cough [ N ];  wheezing[ N ];  hemoptysis[  ]; shortness of breath[ N ]; paroxysmal nocturnal dyspnea[  ]; dyspnea on exertion[  ]; or orthopnea[  ];  GI:  gallstones[  ], vomiting[ Y ];  dysphagia[ N ]; melena[  ];  hematochezia [  ]; heartburn[ N ];   Hx of  Colonoscopy[  ]; GU: kidney stones [  ]; hematuria[ N ];   dysuria [  ];  nocturia[  ];  history of     obstruction [N  ]; urinary frequency [  ]             Skin: rash, swelling[  ];, hair loss[  ];  peripheral edema[ N ];  or itching[  ]; Musculosketetal: myalgias[  ];  joint swelling[  ];  joint erythema[  ];  joint pain[  ];  back pain[  ];  Heme/Lymph: bruising[  ];  bleeding[  ];  anemia[  ];  Neuro: TIA[  ];  headaches[  ];  stroke[ N ];  vertigo[  ];  seizures[ N ];   paresthesias[  ];  difficulty walking[  ];  Psych:depression[N  ]; anxiety[ N ];  Endocrine: diabetes[ N ];  thyroid dysfunction[N  ];  Immunizations: Flu [  ]; Pneumococcal[  ];  Other:  Physical Exam: BP (!) 151/74   Pulse 90   Temp 98.8 F (37.1 C) (Oral)   Resp (!) 21   Ht 5\' 7"  (1.702 m)   Wt 93.7 kg (206 lb 9.1 oz)   SpO2 97%   BMI 32.35 kg/m    General appearance: cooperative and no distress Resp: clear to auscultation bilaterally Cardio: regular rate and rhythm, S1, S2 normal, no murmur, click, rub or gallop GI: soft, non-tender; bowel sounds normal; no masses,  no organomegaly Extremities: extremities normal, atraumatic, no cyanosis or edema Neurologic: Grossly normal, responsive but sleepy  Diagnostic Studies & Laboratory data: Troponin: <0.03, 0.11, 1.00 Hemoglobin A1C 6.00  Echocardiography  Patient:    [name], [name] MR #:       [id number] Study Date: 11/28/2016 Gender:     F Age:        56 Height:     170.2 cm Weight:     93.7 kg BSA:        2.14 m^2 Pt. Status: Room:       2H12C   ADMITTING    Glenetta Hew, MD  ORDERING     Glenetta Hew, MD  REFERRING    Glenetta Hew, MD  SONOGRAPHER  Johny Chess, RDCS, CCT   PERFORMING   [facility], Inpatient  ATTENDING    Little, Wenda Overland  cc:  ------------------------------------------------------------------- LV EF: 60% -   65%  ------------------------------------------------------------------- Indications:      CAD of native vessels 414.01.  ------------------------------------------------------------------- History:   Risk factors:  Hypertension. Dyslipidemia.  ------------------------------------------------------------------- Study Conclusions  - Left ventricle: The cavity size was normal. Systolic function was   normal. The estimated ejection fraction was in the range of 60%   to 65%. Wall motion was normal; there were no regional wall   motion abnormalities. Doppler parameters are consistent with both   elevated ventricular end-diastolic filling pressure and elevated   left atrial filling pressure. - Aortic valve: There was mild regurgitation. -  Mitral valve: There was mild regurgitation. - Right ventricle: The cavity size was mildly dilated. Wall   thickness was normal. - Right atrium: The atrium was mildly dilated. - Atrial septum: No defect or patent foramen ovale was identified.      Recent Radiology Findings:   Dg Chest Port 1 View  Result Date: 11/27/2016 CLINICAL DATA:  STEMI; chest pain, N/V EXAM: PORTABLE CHEST 1 VIEW COMPARISON:  None. FINDINGS: Cardiomegaly. Lungs are clear. No pleural effusion or pneumothorax seen. Osseous structures about the chest are unremarkable. IMPRESSION: 1. Cardiomegaly. 2. Lungs are clear.  No evidence of CHF. Electronically Signed   By: Franki Cabot M.D.   On: 11/27/2016 21:29     I have independently reviewed the above radiologic studies.  Recent Lab Findings: Lab Results  Component Value Date   WBC 16.2 (H) 11/28/2016   HGB 12.1 11/28/2016   HCT 37.5 11/28/2016   PLT 297 11/28/2016   GLUCOSE 143 (H) 11/28/2016   CHOL 173 11/27/2016   TRIG 100 11/27/2016   HDL 46 11/27/2016    LDLCALC 107 (H) 11/27/2016   ALT 12 (L) 11/27/2016   AST 23 11/27/2016   NA 138 11/28/2016   K 3.5 11/28/2016   CL 106 11/28/2016   CREATININE 0.66 11/28/2016   BUN 11 11/28/2016   CO2 26 11/28/2016   INR 1.02 11/27/2016   HGBA1C 6.0 (H) 11/27/2016      Assessment / Plan:   The procedure was explained in great detail. All questions were answered to the patient's satisfaction. Continue medical therapy per Cardiololgy. Possible CABG with Dr. Roxan Hockey when there is OR availability.      I  spent 30 minutes counseling the patient face to face and 50% or more the  time was spent in counseling and coordination of care. The total time spent in the appointment was 60 minutes.   Nicholes Rough, PA-C 11/28/2016 2:25 PM   Patient seen and examined. 66 yo woman with a history of hypertension, not currently on meds. Presented last night with STEMI, resolved before getting to cath lab. Has 3 vessel CAD with complicated RCA. EF normal by cath and echo. Mild MR by echo. Currently pain free and has been since admission.   CABG indicated for survival benefit and relief of symptoms.   I discussed the general nature of the procedure, the need for general anesthesia, the use of cardiopulmonary bypass, and the incisions to be used with [name]. We discussed the expected hospital stay, overall recovery and short and long term outcomes. I informed her of the indications, risks, benefits and alternatives. She understands the risks include, but are not limited to death, stroke, MI, DVT/PE, bleeding, possible need for transfusion, infections, cardiac arrhythmias, as well as other organ system dysfunction including respiratory, renal, or GI complications.   She accepts the risks and agrees to proceed.  Plan CABG on Monday 9/24

## 2016-11-28 NOTE — Progress Notes (Signed)
Son arrived and chaplain was paged to the ED.  Sat with son while mother was in surgery.  Presence of the chaplain symbolized bad news to the family of PT.  After lengthy conversations son revealed his concerns about mom.  Escorted son to room with mom after procedure.

## 2016-11-28 NOTE — Care Management Note (Addendum)
Case Management Note  Patient Details  Name: [name] MRN: [id number] Date of Birth: Jan 19, 1951  Subjective/Objective:   From home alone, presents with STEMI, s/p cath shows 3 vessel dz, for CT surgery eval.    No PCP or insurance listed, NCM scheduled a hospital folluw up apt at the [facility] [place] for Oct 12 at  1 pm, she will be able to utilize the [facility] also for medications.    9/25 [place], BSN- s/p CABG yestereday, on vent , now 4 liters, chest tubes to suction, prededex, neo.   9/26 1118 Tomi Bamberger RN, BSN - patient son works, she has a cousin, Jacques Navy, who lives in [facility], [place] she is coming in on Thursday but will not be staying with patient.  NCM spoke with Constance Holster and she states for NCM to contact Rosebud Poles at [phone] to help with dispo .  Patient gave NCM permission to speak with him also . NCM left message for him to return call.  Patient will need pt/ot eval.  NCM informed floor RN, she will make sure this is ordered. NCM left [place] agency list in patient room in case recs are for [facility] services.  she             Action/Plan: NCM will follow for dc needs.   Expected Discharge Date:                  Expected Discharge Plan:     In-House Referral:     Discharge planning Services  CM Consult  Post Acute Care Choice:    Choice offered to:     DME Arranged:    DME Agency:     HH Arranged:    HH Agency:     Status of Service:  In process, will continue to follow  If discussed at Long Length of Stay Meetings, dates discussed:    Additional Comments:  Zenon Mayo, RN 11/28/2016, 2:35 PM

## 2016-11-28 NOTE — Progress Notes (Signed)
Chaplain following up with patient for Advanced Directives paperwork request.  Patient said she is really sleepy and stomach is hurting and doesn't want to fill out paperwork right this minute but would like to read it over and possibly complete it.  Son is in room with patient, his mother and she states she will be naming him as her HCPOA.  Chaplain left paperwork with patient.  Please page Chaplain or let [place] know once paperwork is ready for completion.      11/28/16 1116  Clinical Encounter Type  Visited With Patient;Family  Visit Type Initial;Psychological support;Spiritual support;Social support  Referral From Physician;Nurse  Consult/Referral To Chaplain

## 2016-11-28 NOTE — Progress Notes (Signed)
TR band removed at 04:45. Level 0. Gauze and tegaderm applied.  Will continue to monitor.

## 2016-11-28 NOTE — Progress Notes (Signed)
ANTICOAGULATION CONSULT NOTE - Follow Up Consult  Pharmacy Consult for Heparin Indication: post-cath  No Known Allergies  Patient Measurements: Height: 5\' 7"  (170.2 cm) Weight: 206 lb 9.1 oz (93.7 kg) IBW/kg (Calculated) : 61.6 Heparin Dosing Weight:   Vital Signs: Temp: 98.8 F (37.1 C) (09/21 1139) Temp Source: Oral (09/21 1139) BP: 151/74 (09/21 1300) Pulse Rate: 90 (09/21 1300)  Labs:  Recent Labs  11/27/16 2115 11/27/16 2202 11/27/16 2331 11/28/16 1241  HGB 13.0 12.6  --  12.1  HCT 39.5 37.0  --  37.5  PLT 295  --   --  297  APTT 29  --   --   --   LABPROT 13.3  --   --   --   INR 1.02  --   --   --   HEPARINUNFRC  --   --   --  0.25*  CREATININE 0.73 0.60  --  0.66  TROPONINI <0.03  --  0.11* 1.00*    Estimated Creatinine Clearance: 83.4 mL/min (by C-G formula based on SCr of 0.66 mg/dL).   Medical History: Past Medical History:  Diagnosis Date  . Colon polyps      Assessment:  CC/HPI: CP>STEMI  PMH: tobacco,  Anticoag: s/p STEMI no intervention TCTS consult. Restart Heparin post-cath drip rate 1200 uts/hr HL 0.25 < goal.  CBC and INR WNL.  No bleeding noted  Goal of Therapy:  Heparin level 0.3-0.7 units/ml Monitor platelets by anticoagulation protocol: Yes   Plan:  Increase  IV heparin 1350 units/hr Check heparin level 6 hrs after rate increase Daily HL and CBC  Bonnita Nasuti Pharm.D. CPP, BCPS Clinical Pharmacist [phone] 11/28/2016 3:06 PM

## 2016-11-28 NOTE — Progress Notes (Addendum)
ANTICOAGULATION CONSULT NOTE - FOLLOW UP    HL = 0.34 (goal 0.3 - 0.7 units/mL) Heparin dosing weight = 82 kg   Assessment: 4 YOF s/p cath to remain on IV heparin and heparin level is therapeutic.  No bleeding nor issue per RN.  Aggrastat is charted as off.   Plan: Increase heparin gtt slightly to 1400 units/hr F/U AM labs   Jakita Dutkiewicz D. Mina Marble, PharmD, BCPS 11/28/2016, 10:41 PM

## 2016-11-28 NOTE — Progress Notes (Signed)
  Echocardiogram 2D Echocardiogram has been performed.  [name] 11/28/2016, 10:02 AM

## 2016-11-28 NOTE — Progress Notes (Addendum)
Progress Note  Patient Name: [name] Date of Encounter: 11/28/2016  Primary Cardiologist: new- Dr. Ellyn Hack  Subjective   Feels much better this am. No chest pain or SOB.   Inpatient Medications    Scheduled Meds: . aspirin  81 mg Oral Daily  . atorvastatin  80 mg Oral q1800  . carvedilol  3.125 mg Oral BID WC  . sodium chloride flush  3 mL Intravenous Q12H   Continuous Infusions: . sodium chloride 10 mL/hr at 11/28/16 0700  . sodium chloride    . heparin 1,200 Units/hr (11/28/16 0704)  . nitroGLYCERIN 40 mcg/min (11/28/16 0700)  . tirofiban 0.15 mcg/kg/min (11/28/16 0703)   PRN Meds: sodium chloride, acetaminophen, ondansetron (ZOFRAN) IV, sodium chloride flush   Vital Signs    Vitals:   11/28/16 0630 11/28/16 0645 11/28/16 0700 11/28/16 0751  BP: (!) 154/86 (!) 148/87    Pulse: 83 85 90   Resp: _0 Temp:    98.6 F (37 C)  TempSrc:    Oral  SpO2: 97% 96% 96%   Weight:      Height:        Intake/Output Summary (Last 24 hours) at 11/28/16 0754 Last data filed at 11/28/16 0700  Gross per 24 hour  Intake           567.84 ml  Output              975 ml  Net          -407.16 ml   Filed Weights   11/27/16 2112 11/27/16 2315  Weight: 184 lb (83.5 kg) 206 lb 9.1 oz (93.7 kg)    Telemetry    NSR, one PVC triplet - Personally Reviewed  ECG    NSR, RBBB. ST elevation inferiorly resolved. - Personally Reviewed  Physical Exam   GEN: WDBF, No acute distress.   Neck: No JVD or bruits Cardiac: RRR, no murmurs, rubs, or gallops. Right radial cath site without hematoma. Respiratory: Clear to auscultation bilaterally. GI: Soft, nontender, non-distended  MS: No edema; No deformity. Neuro:  Nonfocal  Psych: Normal affect   Labs    Chemistry Recent Labs Lab 11/27/16 2115 11/27/16 2202  NA 135 141  K 3.3* 3.3*  CL 105 103  CO2 24  --   GLUCOSE 175* 168*  BUN 12 12  CREATININE 0.73 0.60  CALCIUM 8.8*  --   PROT 7.1  --   ALBUMIN  3.5  --   AST 23  --   ALT 12*  --   ALKPHOS 98  --   BILITOT 0.2*  --   GFRNONAA >60  --   GFRAA >60  --   ANIONGAP 6  --      Hematology Recent Labs Lab 11/27/16 2115 11/27/16 2202  WBC 14.9*  --   RBC 4.67  --   HGB 13.0 12.6  HCT 39.5 37.0  MCV 84.6  --   MCH 27.8  --   MCHC 32.9  --   RDW 14.6  --   PLT 295  --     Cardiac Enzymes Recent Labs Lab 11/27/16 2115 11/27/16 2331  TROPONINI <0.03 0.11*   No results for input(s): TROPIPOC in the last 168 hours.   BNPNo results for input(s): BNP, PROBNP in the last 168 hours.   DDimer No results for input(s): DDIMER in the last 168 hours.   Radiology    Dg Chest [facility]  Result Date: 11/27/2016 CLINICAL DATA:  STEMI; chest pain, N/V EXAM: PORTABLE CHEST 1 VIEW COMPARISON:  None. FINDINGS: Cardiomegaly. Lungs are clear. No pleural effusion or pneumothorax seen. Osseous structures about the chest are unremarkable. IMPRESSION: 1. Cardiomegaly. 2. Lungs are clear.  No evidence of CHF. Electronically Signed   By: Franki Cabot M.D.   On: 11/27/2016 21:29    Cardiac Studies   Procedures   LEFT HEART CATH AND CORONARY ANGIOGRAPHY  Conclusion     Prox RCA-2 lesion, 60 %stenosed. Prox-mid RCA-1 lesion, 55 %stenosed.  Dist RCA-2 lesion, 95 %stenosed. Dist RCA-1 lesion, 90 %stenosed. - This combination is likely culprit lesion, however with very tortuous RCA in a stable patient did not feel it was a good idea to attempt PTCA at this time.  Ost LAD lesion, 65 %stenosed. Mid LAD lesion, 90 %stenosed.  Ost 1st Diag to 1st Diag lesion, 50 %stenosed. 1st Diag lesion, 60 %stenosed.  Dist LAD lesion, 50 %stenosed. - This is beyond the 2nd Diag branch.  2nd Mrg lesion, 60 %stenosed.  The left ventricular systolic function is normal. The left ventricular ejection fraction is 55-65% by visual estimate.  LV end diastolic pressure is severely elevated. 32-35 mmhg  There is moderate (3+) mitral regurgitation.   Very  difficult situation with the patient has severe 2-3 vessel disease involving extensive disease in the distal RCA as well as ostial proximal and mid LAD. As she is now chest pain-free with resolution of her ST elevations, I did not feel like emergent attempted PCI on the RCA was a prudent choice of action. The tortuosity of the vessel and the eccentric nature of the lesions themselves make it a somewhat unfavorable vessel for PCI albeit possible if necessary.  The combination of the RCA in the extensive disease in the LAD begins a question of whether she would potentially benefit from bypass surgery. I have contacted Dr. Roxan Hockey from [facility] and asked him to see the patient. I was informed that since the patient was not having active chest pain he can the patient will be seen tomorrow. There would be no OR date for tomorrow, so therefore can consider "cooling down over the weekend "with plan for next week.   With elevated LVEDP, concern for acute diastolic heart failure, I decided against balloon pump for afterload reduction in light of her likely not going for urgent surgery anytime soon.   Plan:   Admit to CCU. TR band removal.   Afterload/preload reduction with nitroglycerin drip   Will initiate Aggrastat infusion followed by heparin -- plan Aggrastat for at least 18 hours  Reassess in the morning.   Check an echocardiogram based on concern for murmur on exam and MR on LV gram. Cannot exclude that this was catheter related however.  I will start carvedilol and high-dose atorvastatin  Check lipid panel and A1c  Provided she stays stable overnight, I think we can avoid any further invasive procedures on her today.  Pending CT consultation and review of films by interventional colleagues would determine whether we will proceed with multivessel PCI versus bypass surgery.    Glenetta Hew, M.D., M.S. Interventional Cardiologist   Pager # [phone] Phone # [phone] [place], [place] [id number]    Procedural Details/Technique   Technical Details PCP: No primary care provider on file. CARDIOLOGIST: New to [facility] Ellyn [facility])  66 year old woman without documented medical history but likely hypertension who is also a smoker. She does not recall any  family history of CAD. She was in her usual state of health until she started driving home from work at roughly 7:30 PM on 11/27/2016. While driving home she started noticing a tight pressure sensation in her chest and started feeling nauseated and short of breath. She contacted a friend and asked them to call 911. She been pulled over into a parking lot. Upon EMS arrival, her EKG was relatively benign and she was feeling better. However her chest discomfort and nausea returned shortly upon starting in the truck. Upon arrival to [facility] 6 months later, her EKG now showed extensive inferior ST elevations of roughly 4-5 mm. She was nauseated with scant emesis. Showed STEMI was called by the ER doctor upon evaluation of the EKG and the patient was brought in. She was given 4000 heparin and 324 of aspirin along with fentanyl for pain and Zofran for nausea.  Upon my evaluation emergency room, the patient was more comfortable with her tightness now in the 2-3/10 range when it hadn't previously up to 8-9/10 at worst. While I was examining her, her EKG ST elevation on the monitor resolved. She clearly became more comfortable. Based on the fact that she's had dynamic ST elevations I was concerned for possible unstable thrombotic process and in the right coronary artery and felt it prudent to take the patient directly to cardiac catheterization lab for emergent catheterization.  Time Out: Verified patient identification, verified procedure, site/side was marked, verified correct patient position, special equipment/implants available, medications/allergies/relevent history reviewed, required imaging and test  results available. Performed. Consent Signed.   Access:  * RIGHT Radial Artery: 6 Fr sheath -- Seldinger technique using Angiocath Micropuncture Kit -- 10 mL radial cocktail IA;  Left Heart Catheterization: 5 Fr Catheters advanced or exchanged over a J-wire under direct fluoroscopic guidance into the ascending aorta; TIG 4.0 catheter advanced first.  * Left Coronary Artery Cineangiography: JL3.5 Catheter  * Right Coronary Artery Cineangiography: TIG 4.0 Catheter  * LV Hemodynamics (LV Gram): TIG 4.0 Catheter -- due to extensive tortuosity in the innominate artery, I was not able to advance the pigtail catheter to the aortic valve. I therefore had to exchange the catheter back for the TIG catheter advanced across the valve.  NOTE: For future Catheterization procedures, would not recommend Right Radial approach - difficulty advancing catheters.  Initial angiography revealed that her RCA was patent however with tandem 90-95% lesions in the distal RCA preceded by 1560% proximal and mid lesions. The RCA is very tortuous in a Sigma confirmation with lesions being very distal did not appear to be a favorable option for attempted PCI this point. She was hemodynamically stable and pain-free upon sedation in the Cath Lab. This in conjunction with the fact that she had them 60 and 90% lesions in the ostial proximal and mid LAD that would require extensive intervention, I felt that is more prudent to treat the patient medically tonight to stabilize her and decide whether she would benefit from difficult RCA PCI plus LAD PCI versus bypass surgery.  - After completion of angiography, the catheter was removed completely out of the body over wire without complication.  Radial Sheath(s) removed in the Cath Lab with TR band placed for hemostasis.   TR Band: 2230 Hours; 12 mL air  MEDICATIONS * SQ Lidocaine 47m * Radial Cocktail: 3 mg Verapamil in 10 mL NS * Isovue Contrast: 75 mL * IC NTG 200 mg x 1 *  Nitroglycerin infusion started in the Cath Lab *  Aggrastat bolus and drip started and Cath Lab -plan will be to run for 18 hours  Fluoro time: 6.9 minutes. Dose Area Product: [phone] mGycm2. Cumulative Air Kerma: 459 mGy.   Estimated blood loss <50 mL.  During this procedure the patient was administered the following to achieve and maintain moderate conscious sedation: Versed 1 mg, Fentanyl 50 mcg, while the patient's heart rate, blood pressure, and oxygen saturation were continuously monitored. The period of conscious sedation was 28 minutes, of which I was present face-to-face 100% of this time.    Complications   Complications documented before study signed (11/27/2016 11:21 PM EDT)    No complications were associated with this study.  Documented by Leonie Man, MD - 11/27/2016 11:13 PM EDT    Coronary Findings   Dominance: Right  Left Main  Vessel is large.  Left Anterior Descending  Ost LAD lesion, 65% stenosed.  Mid LAD lesion, 90% stenosed. The lesion is segmental, concentric and irregular.  Dist LAD lesion, 50% stenosed. The lesion is tubular and eccentric.  First Diagonal Branch  Vessel is moderate in size.  Ost 1st Diag to 1st Diag lesion, 50% stenosed. The lesion is focal.  1st Diag lesion, 60% stenosed. The lesion is focal, tubular, eccentric and smooth.  Lateral First Diagonal Branch  Vessel is small in size.  First Septal Branch  Vessel is moderate in size.  Second Diagonal Branch  Vessel is small in size.  Second Septal Branch  Vessel is small in size.  Third Septal Branch  Vessel is small in size.  Left Circumflex  First Obtuse Marginal Branch  The vessel exhibits minimal luminal irregularities. Bifurcates distally  Lateral First Obtuse Marginal Branch  Vessel is small in size.  Second Obtuse Marginal Branch  Vessel is moderate in size.  2nd Mrg lesion, 60% stenosed. The lesion is tubular and concentric.  Lateral Second Obtuse Marginal Branch  Vessel  is small in size. Vessel is angiographically normal.  Right Coronary Artery  Vessel is large. The vessel is severely tortuous.  Prox RCA-1 lesion, 55% stenosed. The lesion is located at the bend, tubular and concentric.  Prox RCA-2 lesion, 60% stenosed. The lesion is located at the bend, focal and concentric.  Dist RCA-1 lesion, 90% stenosed. The lesion is irregular and ulcerative.  Dist RCA-2 lesion, 95% stenosed. Culprit lesion. Most likely The lesion is type C, located at the bend, eccentric, irregular and ulcerative.  Acute Marginal Branch  Vessel is small in size.  Right Posterior Descending Artery  Vessel is moderate in size.  Inferior Septal  Vessel is small in size.  Right Posterior Atrioventricular Branch  Vessel is moderate in size.  First Right Posterolateral  Vessel is small in size.  Wall Motion              Left Heart   Left Ventricle The left ventricular size is normal. The left ventricular systolic function is normal. LV end diastolic pressure is severely elevated. The left ventricular ejection fraction is 55-65% by visual estimate. No regional wall motion abnormalities. There is moderate (3+) mitral regurgitation.    Aortic Valve There is no aortic valve stenosis.    Coronary Diagrams   Diagnostic Diagram          Patient Profile     66 y.o. female with history of tobacco abuse presents with acute inferior STEMI  Assessment & Plan    1. Acute inferior STEMI. Spontaneous reperfusion of RCA with critical disease. ST elevation  resolved. Minimal troponin elevation 0.11 so far. Chest pain resolved. Feeling OK this am. Cardiac cath shows severe 3 vesssel disease. The RCA is very tortuous with multiple lesions and critical distal RCA stenosis as culprit. There is diffuse proximal LAD disease and 90% mid LAD stenosis. The LCx has moderate stenosis at the bifurcation and in OM2. LV function looked good. Recommend CABG. Dr. Ellyn Hack discussed with Dr. Roxan Hockey  last night. Plan surgical consult today. Her disease is poorly suited for PCI. Completing IV Aggrastat. IV heparin resumed. On IV Ntg and beta blocker. Echo pending. 2. Tobacco abuse. Counseled on smoking cessation 3. Hypercholesterolemia. On high dose statin 4. HTN. Previously untreated. On IV Ntg. Will titrate beta blocker.  5. Hypokalemia. Repleted.  6. Elevated LVEDP 25 mm Hg. Received IV lasix x 1 last night.  For questions or updates, please contact [place] Please consult [email or web address] for contact info under Cardiology/STEMI.      Signed, Ceylin Dreibelbis [place], MD  11/28/2016, 7:54 AM

## 2016-11-29 DIAGNOSIS — E785 Hyperlipidemia, unspecified: Secondary | ICD-10-CM

## 2016-11-29 DIAGNOSIS — I219 Acute myocardial infarction, unspecified: Secondary | ICD-10-CM

## 2016-11-29 LAB — URINALYSIS, COMPLETE (UACMP) WITH MICROSCOPIC
BILIRUBIN URINE: NEGATIVE
Glucose, UA: NEGATIVE mg/dL
KETONES UR: NEGATIVE mg/dL
Nitrite: POSITIVE — AB
PROTEIN: NEGATIVE mg/dL
Specific Gravity, Urine: 1.019 (ref 1.005–1.030)
pH: 6 (ref 5.0–8.0)

## 2016-11-29 LAB — HEPARIN LEVEL (UNFRACTIONATED): HEPARIN UNFRACTIONATED: 0.42 [IU]/mL (ref 0.30–0.70)

## 2016-11-29 LAB — CBC
HCT: 34.8 % — ABNORMAL LOW (ref 36.0–46.0)
HEMOGLOBIN: 11.5 g/dL — AB (ref 12.0–15.0)
MCH: 27.7 pg (ref 26.0–34.0)
MCHC: 33 g/dL (ref 30.0–36.0)
MCV: 83.9 fL (ref 78.0–100.0)
PLATELETS: 286 10*3/uL (ref 150–400)
RBC: 4.15 MIL/uL (ref 3.87–5.11)
RDW: 14.7 % (ref 11.5–15.5)
WBC: 16.8 10*3/uL — ABNORMAL HIGH (ref 4.0–10.5)

## 2016-11-29 LAB — BASIC METABOLIC PANEL
ANION GAP: 7 (ref 5–15)
BUN: 11 mg/dL (ref 6–20)
CALCIUM: 9 mg/dL (ref 8.9–10.3)
CO2: 26 mmol/L (ref 22–32)
Chloride: 104 mmol/L (ref 101–111)
Creatinine, Ser: 0.63 mg/dL (ref 0.44–1.00)
GFR calc Af Amer: >60 mL/min (ref >60)
GLUCOSE: 138 mg/dL — AB (ref 65–99)
POTASSIUM: 3.2 mmol/L — AB (ref 3.5–5.1)
SODIUM: 137 mmol/L (ref 135–145)

## 2016-11-29 MED ORDER — LOSARTAN POTASSIUM 50 MG PO TABS
50.0000 mg | ORAL_TABLET | Freq: Once | ORAL | Status: AC
  Administered 2016-11-29: 50 mg via ORAL
  Filled 2016-11-29: qty 1

## 2016-11-29 MED ORDER — CARVEDILOL 25 MG PO TABS
25.0000 mg | ORAL_TABLET | Freq: Two times a day (BID) | ORAL | Status: DC
  Administered 2016-11-29 – 2016-12-01 (×4): 25 mg via ORAL
  Filled 2016-11-29 (×4): qty 1

## 2016-11-29 MED ORDER — CARVEDILOL 12.5 MG PO TABS
12.5000 mg | ORAL_TABLET | Freq: Once | ORAL | Status: AC
  Administered 2016-11-29: 12.5 mg via ORAL

## 2016-11-29 MED ORDER — LOSARTAN POTASSIUM 50 MG PO TABS
100.0000 mg | ORAL_TABLET | Freq: Every day | ORAL | Status: DC
  Administered 2016-11-30 – 2016-12-01 (×2): 100 mg via ORAL
  Filled 2016-11-29 (×2): qty 2

## 2016-11-29 MED ORDER — POTASSIUM CHLORIDE CRYS ER 20 MEQ PO TBCR
40.0000 meq | EXTENDED_RELEASE_TABLET | Freq: Once | ORAL | Status: AC
  Administered 2016-11-29: 40 meq via ORAL
  Filled 2016-11-29: qty 2

## 2016-11-29 NOTE — Progress Notes (Signed)
Progress Note  Patient Name: [name] Date of Encounter: 11/29/2016  Primary Cardiologist: Dr. Ellyn Hack  Subjective   Feeling well. Denies chest pain or shortness of breath. Reports headache.  Inpatient Medications    Scheduled Meds: . aspirin  81 mg Oral Daily  . atorvastatin  80 mg Oral q1800  . carvedilol  25 mg Oral BID WC  . losartan  50 mg Oral Daily  . sodium chloride flush  3 mL Intravenous Q12H   Continuous Infusions: . sodium chloride Stopped (11/28/16 0703)  . sodium chloride    . heparin 1,400 Units/hr (11/29/16 0700)  . nitroGLYCERIN 80 mcg/min (11/29/16 0758)   PRN Meds: sodium chloride, acetaminophen, hydrALAZINE, ondansetron (ZOFRAN) IV, sodium chloride flush   Vital Signs    Vitals:   11/29/16 0700 11/29/16 0733 11/29/16 0800 11/29/16 0900  BP: (!) 175/105  (!) 163/85 (!) 143/115  Pulse: 86  85 86  Resp: 16  14 15   Temp:  98.7 F (37.1 C)    TempSrc:  Oral    SpO2: 98%  100% 97%  Weight:      Height:        Intake/Output Summary (Last 24 hours) at 11/29/16 0934 Last data filed at 11/29/16 0800  Gross per 24 hour  Intake          1189.41 ml  Output              600 ml  Net           589.41 ml   Filed Weights   11/27/16 2112 11/27/16 2315  Weight: 83.5 kg (184 lb) 93.7 kg (206 lb 9.1 oz)    Telemetry    Sinus rhythm. Repeat run of NSVT. - Personally Reviewed  ECG    Sinus rhythm.  Rate 94 bpm.  RBBB. Inferior infarct, age indeterminate - Personally Reviewed  Physical Exam   GEN: Well-appearing.  No acute distress.   Neck: No JVD Cardiac: RRR, no murmurs, rubs, or gallops.  Respiratory: Clear to auscultation bilaterally. GI: Soft, nontender, non-distended  MS: No edema; No deformity. Neuro:  Nonfocal  Psych: Normal affect   Labs    Chemistry Recent Labs Lab 11/27/16 2115 11/27/16 2202 11/28/16 1241 11/29/16 0431  NA 135 141 138 137  K 3.3* 3.3* 3.5 3.2*  CL 105 103 106 104  CO2 24  --  26 26  GLUCOSE 175*  168* 143* 138*  BUN 12 12 11 11   CREATININE 0.73 0.60 0.66 0.63  CALCIUM 8.8*  --  8.9 9.0  PROT 7.1  --   --   --   ALBUMIN 3.5  --   --   --   AST 23  --   --   --   ALT 12*  --   --   --   ALKPHOS 98  --   --   --   BILITOT 0.2*  --   --   --   GFRNONAA >60  --  >60 >60  GFRAA >60  --  >60 >60  ANIONGAP 6  --  6 7     Hematology Recent Labs Lab 11/27/16 2115 11/27/16 2202 11/28/16 1241 11/29/16 0431  WBC 14.9*  --  16.2* 16.8*  RBC 4.67  --  4.45 4.15  HGB 13.0 12.6 12.1 11.5*  HCT 39.5 37.0 37.5 34.8*  MCV 84.6  --  84.3 83.9  MCH 27.8  --  27.2 27.7  MCHC 32.9  --  32.3 33.0  RDW 14.6  --  14.6 14.7  PLT 295  --  297 286    Cardiac Enzymes Recent Labs Lab 11/27/16 2115 11/27/16 2331 11/28/16 1241  TROPONINI <0.03 0.11* 1.00*   No results for input(s): TROPIPOC in the last 168 hours.   BNPNo results for input(s): BNP, PROBNP in the last 168 hours.   DDimer No results for input(s): DDIMER in the last 168 hours.   Radiology    Dg Chest Port 1 View  Result Date: 11/27/2016 CLINICAL DATA:  STEMI; chest pain, N/V EXAM: PORTABLE CHEST 1 VIEW COMPARISON:  None. FINDINGS: Cardiomegaly. Lungs are clear. No pleural effusion or pneumothorax seen. Osseous structures about the chest are unremarkable. IMPRESSION: 1. Cardiomegaly. 2. Lungs are clear.  No evidence of CHF. Electronically Signed   By: Franki Cabot M.D.   On: 11/27/2016 21:29    Cardiac Studies   Echo 11/28/16: Study Conclusions  - Left ventricle: The cavity size was normal. Systolic function was   normal. The estimated ejection fraction was in the range of 60%   to 65%. Wall motion was normal; there were no regional wall   motion abnormalities. Doppler parameters are consistent with both   elevated ventricular end-diastolic filling pressure and elevated   left atrial filling pressure. - Aortic valve: There was mild regurgitation. - Mitral valve: There was mild regurgitation. - Right ventricle: The  cavity size was mildly dilated. Wall   thickness was normal. - Right atrium: The atrium was mildly dilated. - Atrial septum: No defect or patent foramen ovale was identified.  LHC 11/27/16:  Prox RCA-2 lesion, 60 %stenosed. Prox-mid RCA-1 lesion, 55 %stenosed.  Dist RCA-2 lesion, 95 %stenosed. Dist RCA-1 lesion, 90 %stenosed. - This combination is likely culprit lesion, however with very tortuous RCA in a stable patient did not feel it was a good idea to attempt PTCA at this time.  Ost LAD lesion, 65 %stenosed. Mid LAD lesion, 90 %stenosed.  Ost 1st Diag to 1st Diag lesion, 50 %stenosed. 1st Diag lesion, 60 %stenosed.  Dist LAD lesion, 50 %stenosed. - This is beyond the 2nd Diag branch.  2nd Mrg lesion, 60 %stenosed.  The left ventricular systolic function is normal. The left ventricular ejection fraction is 55-65% by visual estimate.  LV end diastolic pressure is severely elevated. 32-35 mmhg  There is moderate (3+) mitral regurgitation.  Patient Profile     66 y.o. female with hypertension admitted with inferior STEMI.  She was found to have severe 3 vessel CAD.  Assessment & Plan    # Inferior STEMI: # 3 Vessel CAD: Planning for CABG 9/24.  She is currently asymptomatic.  Last troponin was 1.0.  Continue aspirin and heparin. Blood pressure is poorly-controlled. Increase carvedilol to 25 mg daily.  Atorvastatin was started this admission. She will need lipids and CMP in 6 weeks.  Continue nitroglycerin.  LVEF was 60-65% on echo.    # Hypertension: BP above goal. Increase carvedilol as above.  Continue losartan 50 mg daily.  # Leukocytosis: WBC remains elevated.  U/A has been ordered.  CXR was unremarkable.  She reports "hot flashes" that are unchanged from her baseline. She is otherwise asymptomatic. Low threshold for starting antibiotics given her upcoming surgery.  # Hypokalemia: Supplement K.  For questions or updates, please contact [place] Please consult  [email or web address] for contact info under Cardiology/STEMI.      Signed, Skeet Latch, MD  11/29/2016, 9:34 AM

## 2016-11-29 NOTE — Progress Notes (Signed)
ANTICOAGULATION CONSULT NOTE - Follow Up Consult  Pharmacy Consult for Heparin Indication: post-cath  No Known Allergies  Patient Measurements: Height: 5\' 7"  (170.2 cm) Weight: 206 lb 9.1 oz (93.7 kg) IBW/kg (Calculated) : 61.6 Heparin Dosing Weight:   Vital Signs: Temp: 98.7 F (37.1 C) (09/22 0733) Temp Source: Oral (09/22 0733) BP: 175/105 (09/22 0700) Pulse Rate: 86 (09/22 0700)  Labs:  Recent Labs  11/27/16 2115 11/27/16 2202 11/27/16 2331 11/28/16 1241 11/28/16 2136 11/29/16 0431  HGB 13.0 12.6  --  12.1  --  11.5*  HCT 39.5 37.0  --  37.5  --  34.8*  PLT 295  --   --  297  --  286  APTT 29  --   --   --   --   --   LABPROT 13.3  --   --   --   --   --   INR 1.02  --   --   --   --   --   HEPARINUNFRC  --   --   --  0.25* 0.34 0.42  CREATININE 0.73 0.60  --  0.66  --  0.63  TROPONINI <0.03  --  0.11* 1.00*  --   --     Estimated Creatinine Clearance: 83.4 mL/min (by C-G formula based on SCr of 0.63 mg/dL).   Medical History: Past Medical History:  Diagnosis Date  . Colon polyps      Assessment:  CC/HPI: CP>STEMI  PMH: tobacco,  Anticoag: s/p STEMI no intervention TCTS consult plan CABG on Monday 9/24. Restarted Heparin post-cath drip rate 1400 uts/hr HL 0.4 at goal.  CBC and INR WNL.  No bleeding noted.  Tirofiban started in cath lab for clot burden currently off.    Goal of Therapy:  Heparin level 0.3-0.7 units/ml Monitor platelets by anticoagulation protocol: Yes   Plan:  Continue  IV heparin 1400 units/hr Daily HL and CBC  Bonnita Nasuti Pharm.D. CPP, BCPS Clinical Pharmacist [phone] 11/29/2016 9:12 AM

## 2016-11-29 NOTE — Progress Notes (Signed)
Pt scheduled for Heart surgery on Monday 9/24.  Pt not up to ambulating at this time due to headache from the NTG and feeling nauseated.  Pt with cold compress over her eyes and forehead. Pre op teaching completed with pt and son.  Pt able to demonstrate appropriately how to use pillow and incentive spirometer.  Pt instructed to rise from seated position using legs and avoid pressing down on arms. Pt and son given expectation timeline and reviewed key components in the preparing for heart surgery booklet.  Pt son was unaware he needed to be available on Monday for pt surgery.  Pt does not have a clear plan on where she will go after discharge.  Pt lives alone and states her son can not stay with her because he has to work to pay bills. Pt thinks maybe going to snf/rehab for short period of time may be best.  However pt is uncertain since she does not have insurance what her options will be.  Pt stated she has been seen by Development worker, community. Turned on video for pt and son to watch.  Encourged pt and son to view again on tomorrow due to the low volume on the TV. Pt and son verbalized agreement. [place], BSN Cardiac and Pulmonary Rehab Nurse Navigator

## 2016-11-30 ENCOUNTER — Inpatient Hospital Stay (HOSPITAL_COMMUNITY): Payer: Medicaid Other

## 2016-11-30 DIAGNOSIS — Z01810 Encounter for preprocedural cardiovascular examination: Secondary | ICD-10-CM

## 2016-11-30 DIAGNOSIS — N3000 Acute cystitis without hematuria: Secondary | ICD-10-CM

## 2016-11-30 LAB — CBC
HCT: 35.2 % — ABNORMAL LOW (ref 36.0–46.0)
Hemoglobin: 11.6 g/dL — ABNORMAL LOW (ref 12.0–15.0)
MCH: 27.5 pg (ref 26.0–34.0)
MCHC: 33 g/dL (ref 30.0–36.0)
MCV: 83.4 fL (ref 78.0–100.0)
Platelets: 264 10*3/uL (ref 150–400)
RBC: 4.22 MIL/uL (ref 3.87–5.11)
RDW: 14.5 % (ref 11.5–15.5)
WBC: 15.8 10*3/uL — AB (ref 4.0–10.5)

## 2016-11-30 LAB — TYPE AND SCREEN
ABO/RH(D): B POS
ANTIBODY SCREEN: NEGATIVE

## 2016-11-30 LAB — ABO/RH: ABO/RH(D): B POS

## 2016-11-30 LAB — BLOOD GAS, ARTERIAL
ACID-BASE EXCESS: 4.4 mmol/L — AB (ref 0.0–2.0)
Bicarbonate: 28.3 mmol/L — ABNORMAL HIGH (ref 20.0–28.0)
Drawn by: 418751
O2 CONTENT: 21 L/min
O2 SAT: 92.4 %
Patient temperature: 98.6
pCO2 arterial: 41.5 mmHg (ref 32.0–48.0)
pH, Arterial: 7.449 (ref 7.350–7.450)
pO2, Arterial: 66.3 mmHg — ABNORMAL LOW (ref 83.0–108.0)

## 2016-11-30 LAB — VAS US DOPPLER PRE CABG
LCCADDIAS: -12 cm/s
LCCADSYS: -96 cm/s
LCCAPDIAS: 11 cm/s
LCCAPSYS: 86 cm/s
LEFT ECA DIAS: -12 cm/s
LEFT VERTEBRAL DIAS: -12 cm/s
LICADSYS: -48 cm/s
Left ICA dist dias: -19 cm/s
Left ICA prox dias: -10 cm/s
Left ICA prox sys: -36 cm/s
RCCADSYS: -69 cm/s
RCCAPSYS: -112 cm/s
RIGHT ECA DIAS: -13 cm/s
RIGHT VERTEBRAL DIAS: -26 cm/s
Right CCA prox dias: -23 cm/s

## 2016-11-30 LAB — BASIC METABOLIC PANEL
ANION GAP: 7 (ref 5–15)
BUN: 11 mg/dL (ref 6–20)
CHLORIDE: 104 mmol/L (ref 101–111)
CO2: 27 mmol/L (ref 22–32)
Calcium: 8.8 mg/dL — ABNORMAL LOW (ref 8.9–10.3)
Creatinine, Ser: 0.68 mg/dL (ref 0.44–1.00)
GFR calc non Af Amer: >60 mL/min (ref >60)
Glucose, Bld: 124 mg/dL — ABNORMAL HIGH (ref 65–99)
Potassium: 3.3 mmol/L — ABNORMAL LOW (ref 3.5–5.1)
Sodium: 138 mmol/L (ref 135–145)

## 2016-11-30 LAB — HEPARIN LEVEL (UNFRACTIONATED): Heparin Unfractionated: 0.21 IU/mL — ABNORMAL LOW (ref 0.30–0.70)

## 2016-11-30 LAB — GLUCOSE, CAPILLARY: Glucose-Capillary: 134 mg/dL — ABNORMAL HIGH (ref 65–99)

## 2016-11-30 MED ORDER — MAGNESIUM SULFATE 50 % IJ SOLN
40.0000 meq | INTRAMUSCULAR | Status: DC
  Filled 2016-11-30: qty 10

## 2016-11-30 MED ORDER — CHLORHEXIDINE GLUCONATE 0.12 % MT SOLN: 15.0000 mL | OROMUCOSAL | Status: DC

## 2016-11-30 MED ORDER — METOPROLOL TARTRATE 12.5 MG HALF TABLET: 12.5000 mg | ORAL_TABLET | ORAL | Status: DC

## 2016-11-30 MED ORDER — DIAZEPAM 2 MG PO TABS: 2.0000 mg | ORAL_TABLET | ORAL | Status: DC

## 2016-11-30 MED ORDER — TRANEXAMIC ACID 1000 MG/10ML IV SOLN
1.5000 mg/kg/h | INTRAVENOUS | Status: AC
  Administered 2016-12-01: 1.5 mg/kg/h via INTRAVENOUS
  Filled 2016-11-30: qty 25

## 2016-11-30 MED ORDER — TRANEXAMIC ACID (OHS) PUMP PRIME SOLUTION
2.0000 mg/kg | INTRAVENOUS | Status: DC
  Filled 2016-11-30: qty 1.87

## 2016-11-30 MED ORDER — SODIUM CHLORIDE 0.9 % IV SOLN
INTRAVENOUS | Status: DC
  Filled 2016-11-30: qty 30

## 2016-11-30 MED ORDER — DOPAMINE-DEXTROSE 3.2-5 MG/ML-% IV SOLN
0.0000 ug/kg/min | INTRAVENOUS | Status: DC
  Filled 2016-11-30: qty 250

## 2016-11-30 MED ORDER — POTASSIUM CHLORIDE CRYS ER 20 MEQ PO TBCR
40.0000 meq | EXTENDED_RELEASE_TABLET | Freq: Every day | ORAL | Status: DC
  Administered 2016-11-30 – 2016-12-01 (×2): 40 meq via ORAL
  Filled 2016-11-30 (×2): qty 2

## 2016-11-30 MED ORDER — PLASMA-LYTE 148 IV SOLN
INTRAVENOUS | Status: AC
  Administered 2016-12-01: 500 mL
  Filled 2016-11-30: qty 2.5

## 2016-11-30 MED ORDER — TEMAZEPAM 15 MG PO CAPS
15.0000 mg | ORAL_CAPSULE | Freq: Once | ORAL | Status: AC | PRN
  Administered 2016-11-30: 15 mg via ORAL
  Filled 2016-11-30: qty 1

## 2016-11-30 MED ORDER — VANCOMYCIN HCL 10 G IV SOLR
1500.0000 mg | INTRAVENOUS | Status: AC
  Administered 2016-12-01: 1500 mg via INTRAVENOUS
  Filled 2016-11-30: qty 1500

## 2016-11-30 MED ORDER — CHLORHEXIDINE GLUCONATE CLOTH 2 % EX PADS
6.0000 | MEDICATED_PAD | Freq: Once | CUTANEOUS | Status: AC
  Administered 2016-11-30: 6 via TOPICAL

## 2016-11-30 MED ORDER — TRANEXAMIC ACID (OHS) BOLUS VIA INFUSION
15.0000 mg/kg | INTRAVENOUS | Status: AC
  Administered 2016-12-01: 1405.5 mg via INTRAVENOUS
  Filled 2016-11-30: qty 1406

## 2016-11-30 MED ORDER — DEXTROSE 5 % IV SOLN
1.5000 g | INTRAVENOUS | Status: AC
  Administered 2016-12-01: .75 g via INTRAVENOUS
  Administered 2016-12-01: 1.5 g via INTRAVENOUS
  Filled 2016-11-30: qty 1.5

## 2016-11-30 MED ORDER — DEXMEDETOMIDINE HCL IN NACL 400 MCG/100ML IV SOLN
0.1000 ug/kg/h | INTRAVENOUS | Status: AC
  Administered 2016-12-01: 0.7 ug/kg/h via INTRAVENOUS
  Filled 2016-11-30: qty 100

## 2016-11-30 MED ORDER — DEXTROSE 5 % IV SOLN
750.0000 mg | INTRAVENOUS | Status: DC
  Filled 2016-11-30: qty 750

## 2016-11-30 MED ORDER — BISACODYL 5 MG PO TBEC
5.0000 mg | DELAYED_RELEASE_TABLET | Freq: Once | ORAL | Status: AC
  Administered 2016-11-30: 5 mg via ORAL
  Filled 2016-11-30: qty 1

## 2016-11-30 MED ORDER — DEXTROSE 5 % IV SOLN
1.0000 g | INTRAVENOUS | Status: AC
  Administered 2016-11-30 – 2016-12-02 (×3): 1 g via INTRAVENOUS
  Filled 2016-11-30 (×3): qty 10

## 2016-11-30 MED ORDER — AMLODIPINE BESYLATE 2.5 MG PO TABS
2.5000 mg | ORAL_TABLET | Freq: Every day | ORAL | Status: DC
  Administered 2016-11-30 – 2016-12-01 (×2): 2.5 mg via ORAL
  Filled 2016-11-30 (×2): qty 1

## 2016-11-30 MED ORDER — SODIUM CHLORIDE 0.9 % IV SOLN
30.0000 ug/min | INTRAVENOUS | Status: AC
  Administered 2016-12-01: 25 ug/min via INTRAVENOUS
  Filled 2016-11-30: qty 2

## 2016-11-30 MED ORDER — POTASSIUM CHLORIDE 2 MEQ/ML IV SOLN
80.0000 meq | INTRAVENOUS | Status: DC
  Filled 2016-11-30: qty 40

## 2016-11-30 MED ORDER — EPINEPHRINE PF 1 MG/ML IJ SOLN
0.0000 ug/min | INTRAVENOUS | Status: DC
  Filled 2016-11-30: qty 4

## 2016-11-30 MED ORDER — CHLORHEXIDINE GLUCONATE CLOTH 2 % EX PADS
6.0000 | MEDICATED_PAD | Freq: Once | CUTANEOUS | Status: AC
Start: 2016-11-30 — End: 2016-11-30
  Administered 2016-11-30: 6 via TOPICAL

## 2016-11-30 MED ORDER — PROMETHAZINE HCL 25 MG PO TABS
25.0000 mg | ORAL_TABLET | Freq: Four times a day (QID) | ORAL | Status: DC | PRN
  Administered 2016-12-01: 25 mg via ORAL
  Filled 2016-11-30: qty 1

## 2016-11-30 MED ORDER — AMLODIPINE BESYLATE 5 MG PO TABS: 5.0000 mg | ORAL_TABLET | Freq: Every day | ORAL | Status: DC

## 2016-11-30 MED ORDER — SODIUM CHLORIDE 0.9 % IV SOLN
INTRAVENOUS | Status: AC
  Administered 2016-12-01: .9 [IU]/h via INTRAVENOUS
  Filled 2016-11-30: qty 1

## 2016-11-30 MED ORDER — NITROGLYCERIN IN D5W 200-5 MCG/ML-% IV SOLN
2.0000 ug/min | INTRAVENOUS | Status: AC
  Administered 2016-12-01: 140 ug/min via INTRAVENOUS
  Filled 2016-11-30 (×3): qty 250

## 2016-11-30 NOTE — Progress Notes (Signed)
Pre-op Cardiac Surgery  Carotid Findings:  1-395 ICA plaquing. Vertebral artery flow is antegrade.   Upper Extremity Right Left  Brachial Pressures 140T 130T  Radial Waveforms T T  Ulnar Waveforms T T  Palmar Arch (Allen's Test) Doppler signal remains normal with radial compression and diminishes 50% with ulnar compression WNL   Findings:      Lower  Extremity Right Left  Dorsalis Pedis    Anterior Tibial T T  Posterior Tibial T T  Ankle/Brachial Indices      Findings:

## 2016-11-30 NOTE — Progress Notes (Signed)
Progress Note  Patient Name: [name] Date of Encounter: 11/30/2016  Primary Cardiologist: Dr. Ellyn Hack  Subjective   Reports nausea.  Headache is better but still flares at times.   Inpatient Medications    Scheduled Meds: . amLODipine  5 mg Oral Daily  . aspirin  81 mg Oral Daily  . atorvastatin  80 mg Oral q1800  . carvedilol  25 mg Oral BID WC  . losartan  100 mg Oral Daily  . sodium chloride flush  3 mL Intravenous Q12H   Continuous Infusions: . sodium chloride Stopped (11/28/16 0703)  . sodium chloride    . heparin 1,400 Units/hr (11/30/16 0700)  . nitroGLYCERIN 70 mcg/min (11/30/16 0809)   PRN Meds: sodium chloride, acetaminophen, hydrALAZINE, ondansetron (ZOFRAN) IV, sodium chloride flush   Vital Signs    Vitals:   11/30/16 0555 11/30/16 0600 11/30/16 0630 11/30/16 0700  BP: (!) 155/94 (!) 146/101 (!) 167/103 (!) 145/87  Pulse: 83 69 74 95  Resp: 15 14 13  (!) 24  Temp:      TempSrc:      SpO2: 97% 97% 97% 97%  Weight:      Height:        Intake/Output Summary (Last 24 hours) at 11/30/16 0819 Last data filed at 11/30/16 0700  Gross per 24 hour  Intake           823.88 ml  Output             1900 ml  Net         -1076.12 ml   Filed Weights   11/27/16 2112 11/27/16 2315  Weight: 83.5 kg (184 lb) 93.7 kg (206 lb 9.1 oz)    Telemetry    Sinus rhythm. Repeat run of NSVT. - Personally Reviewed  ECG    Sinus rhythm.  Rate 94 bpm.  RBBB. Inferior infarct, age indeterminate - Personally Reviewed  Physical Exam  VS:  BP (!) 145/87   Pulse 95   Temp 99 F (37.2 C) (Oral)   Resp (!) 24   Ht 5\' 7"  (1.702 m)   Wt 93.7 kg (206 lb 9.1 oz)   SpO2 97%   BMI 32.35 kg/m  , BMI Body mass index is 32.35 kg/m. GENERAL:  Well appearing.  No acute distress. HEENT: Pupils equal round and reactive, fundi not visualized, oral mucosa unremarkable.  Poor dentition NECK:  No jugular venous distention, waveform within normal limits, carotid upstroke  brisk and symmetric, no bruits LUNGS:  Clear to auscultation bilaterally HEART:  RRR.  PMI not displaced or sustained,S1 and S2 within normal limits, no S3, no S4, no clicks, no rubs, no murmurs ABD:  Flat, positive bowel sounds normal in frequency in pitch, no bruits, no rebound, no guarding, no midline pulsatile mass, no hepatomegaly, no splenomegaly EXT:  2 plus pulses throughout, no edema, no cyanosis no clubbing SKIN:  No rashes no nodules NEURO:  Cranial nerves II through XII grossly intact, motor grossly intact throughout [facility]:  Cognitively intact, oriented to person place and time  Labs    Chemistry Recent Labs Lab 11/27/16 2115  11/28/16 1241 11/29/16 0431 11/30/16 0442  NA 135  < > 138 137 138  K 3.3*  < > 3.5 3.2* 3.3*  CL 105  < > 106 104 104  CO2 24  --  26 26 27   GLUCOSE 175*  < > 143* 138* 124*  BUN 12  < > 11 11 11   CREATININE  0.73  < > 0.66 0.63 0.68  CALCIUM 8.8*  --  8.9 9.0 8.8*  PROT 7.1  --   --   --   --   ALBUMIN 3.5  --   --   --   --   AST 23  --   --   --   --   ALT 12*  --   --   --   --   ALKPHOS 98  --   --   --   --   BILITOT 0.2*  --   --   --   --   GFRNONAA >60  --  >60 >60 >60  GFRAA >60  --  >60 >60 >60  ANIONGAP 6  --  6 7 7   < > = values in this interval not displayed.   Hematology  Recent Labs Lab 11/28/16 1241 11/29/16 0431 11/30/16 0442  WBC 16.2* 16.8* 15.8*  RBC 4.45 4.15 4.22  HGB 12.1 11.5* 11.6*  HCT 37.5 34.8* 35.2*  MCV 84.3 83.9 83.4  MCH 27.2 27.7 27.5  MCHC 32.3 33.0 33.0  RDW 14.6 14.7 14.5  PLT 297 286 264    Cardiac Enzymes  Recent Labs Lab 11/27/16 2115 11/27/16 2331 11/28/16 1241  TROPONINI <0.03 0.11* 1.00*   No results for input(s): TROPIPOC in the last 168 hours.   BNPNo results for input(s): BNP, PROBNP in the last 168 hours.   DDimer No results for input(s): DDIMER in the last 168 hours.   Radiology    No results found.  Cardiac Studies   Echo 11/28/16: Study Conclusions  - Left  ventricle: The cavity size was normal. Systolic function was   normal. The estimated ejection fraction was in the range of 60%   to 65%. Wall motion was normal; there were no regional wall   motion abnormalities. Doppler parameters are consistent with both   elevated ventricular end-diastolic filling pressure and elevated   left atrial filling pressure. - Aortic valve: There was mild regurgitation. - Mitral valve: There was mild regurgitation. - Right ventricle: The cavity size was mildly dilated. Wall   thickness was normal. - Right atrium: The atrium was mildly dilated. - Atrial septum: No defect or patent foramen ovale was identified.  LHC 11/27/16:  Prox RCA-2 lesion, 60 %stenosed. Prox-mid RCA-1 lesion, 55 %stenosed.  Dist RCA-2 lesion, 95 %stenosed. Dist RCA-1 lesion, 90 %stenosed. - This combination is likely culprit lesion, however with very tortuous RCA in a stable patient did not feel it was a good idea to attempt PTCA at this time.  Ost LAD lesion, 65 %stenosed. Mid LAD lesion, 90 %stenosed.  Ost 1st Diag to 1st Diag lesion, 50 %stenosed. 1st Diag lesion, 60 %stenosed.  Dist LAD lesion, 50 %stenosed. - This is beyond the 2nd Diag branch.  2nd Mrg lesion, 60 %stenosed.  The left ventricular systolic function is normal. The left ventricular ejection fraction is 55-65% by visual estimate.  LV end diastolic pressure is severely elevated. 32-35 mmhg  There is moderate (3+) mitral regurgitation.  Patient Profile     66 y.o. female with hypertension admitted with inferior STEMI.  She was found to have severe 3 vessel CAD.  Assessment & Plan    # Inferior STEMI: # 3 Vessel CAD: # Hypertension:  Going for CABG 9/24.  She is currently asymptomatic.  Last troponin was 1.0.  Continue aspirin and heparin. Blood pressure is much better controlled today.  Carvedilol was increased to 25mg   and losartan was increased to 100mg  on 9/22.  Will add amlodipine 2.5mg  daily.  Atorvastatin  was started this admission. She will need lipids and CMP in 6 weeks.  Continue nitroglycerin.  LVEF was 60-65% on echo.    # UTI: WBC remains elevated.  U/A was positive.  Will given Ceftriaxone x3 days given her upcoming surgery.  # Hypokalemia: Supplement K.  For questions or updates, please contact [place] Please consult [email or web address] for contact info under Cardiology/STEMI.      Signed, Skeet Latch, MD  11/30/2016, 8:19 AM

## 2016-11-30 NOTE — Progress Notes (Signed)
ANTICOAGULATION CONSULT NOTE - Follow Up Consult  Pharmacy Consult for Heparin Indication: post-cath  No Known Allergies  Patient Measurements: Height: 5\' 7"  (170.2 cm) Weight: 206 lb 9.1 oz (93.7 kg) IBW/kg (Calculated) : 61.6 Heparin Dosing Weight:   Vital Signs: Temp: 98.6 F (37 C) (09/23 0836) Temp Source: Oral (09/23 0836) BP: 145/87 (09/23 0700) Pulse Rate: 96 (09/23 0800)  Labs:  Recent Labs  11/27/16 2115  11/27/16 2331  11/28/16 1241 11/28/16 2136 11/29/16 0431 11/30/16 0442  HGB 13.0  < >  --   --  12.1  --  11.5* 11.6*  HCT 39.5  < >  --   --  37.5  --  34.8* 35.2*  PLT 295  --   --   --  297  --  286 264  APTT 29  --   --   --   --   --   --   --   LABPROT 13.3  --   --   --   --   --   --   --   INR 1.02  --   --   --   --   --   --   --   HEPARINUNFRC  --   --   --   < > 0.25* 0.34 0.42 0.21*  CREATININE 0.73  < >  --   --  0.66  --  0.63 0.68  TROPONINI <0.03  --  0.11*  --  1.00*  --   --   --   < > = values in this interval not displayed.  Estimated Creatinine Clearance: 83.4 mL/min (by C-G formula based on SCr of 0.68 mg/dL).   Medical History: Past Medical History:  Diagnosis Date  . Colon polyps      Assessment:  CC/HPI: CP>STEMI  PMH: tobacco,  Anticoag: s/p STEMI no intervention> TCTS consult plan CABG on Monday 9/24. Restarted Heparin post-cath drip rate 1400 uts/hr HL fell 0.2 now <  goal.  CBC and INR WNL.  No bleeding noted.  Tirofiban started in cath lab for clot burden currently off.   WBC slightly elevated, afebrile UA + > rocephin x3 days  Goal of Therapy:  Heparin level 0.3-0.7 units/ml Monitor platelets by anticoagulation protocol: Yes   Plan:  Increase  IV heparin 1500 units/hr Daily HL and CBC Rocephin 1gm IV q24h x3 doses  Bonnita Nasuti Pharm.D. CPP, BCPS Clinical Pharmacist [phone] 11/30/2016 9:31 AM

## 2016-12-01 ENCOUNTER — Inpatient Hospital Stay (HOSPITAL_COMMUNITY): Payer: Medicaid Other

## 2016-12-01 ENCOUNTER — Inpatient Hospital Stay (HOSPITAL_COMMUNITY): Payer: Medicaid Other | Admitting: Anesthesiology

## 2016-12-01 ENCOUNTER — Inpatient Hospital Stay (HOSPITAL_COMMUNITY)
Admission: EM | Disposition: A | Discharge status: Home Health | Payer: Self-pay | Source: Home / Self Care | Attending: Cardiology

## 2016-12-01 DIAGNOSIS — Z951 Presence of aortocoronary bypass graft: Secondary | ICD-10-CM

## 2016-12-01 HISTORY — DX: Presence of aortocoronary bypass graft: Z95.1

## 2016-12-01 HISTORY — PX: INTRAOPERATIVE TRANSESOPHAGEAL ECHOCARDIOGRAM: SHX5062

## 2016-12-01 HISTORY — PX: CORONARY ARTERY BYPASS GRAFT: SHX141

## 2016-12-01 LAB — CBC
HCT: 36.3 % (ref 36.0–46.0)
HCT: 31.4 % — ABNORMAL LOW (ref 36.0–46.0)
HEMOGLOBIN: 10.2 g/dL — AB (ref 12.0–15.0)
Hemoglobin: 11.8 g/dL — ABNORMAL LOW (ref 12.0–15.0)
MCH: 27.3 pg (ref 26.0–34.0)
MCH: 27.4 pg (ref 26.0–34.0)
MCHC: 32.5 g/dL (ref 30.0–36.0)
MCHC: 32.5 g/dL (ref 30.0–36.0)
MCV: 84 fL (ref 78.0–100.0)
MCV: 84.4 fL (ref 78.0–100.0)
PLATELETS: 252 10*3/uL (ref 150–400)
PLATELETS: 176 10*3/uL (ref 150–400)
RBC: 4.32 MIL/uL (ref 3.87–5.11)
RBC: 3.72 MIL/uL — AB (ref 3.87–5.11)
RDW: 14.4 % (ref 11.5–15.5)
RDW: 14.4 % (ref 11.5–15.5)
WBC: 16.7 10*3/uL — AB (ref 4.0–10.5)
WBC: 22.5 10*3/uL — AB (ref 4.0–10.5)

## 2016-12-01 LAB — POCT I-STAT, CHEM 8
BUN: 10 mg/dL (ref 6–20)
BUN: 10 mg/dL (ref 6–20)
BUN: 10 mg/dL (ref 6–20)
BUN: 10 mg/dL (ref 6–20)
BUN: 14 mg/dL (ref 6–20)
BUN: 9 mg/dL (ref 6–20)
CALCIUM ION: 1.18 mmol/L (ref 1.15–1.40)
CALCIUM ION: 1.11 mmol/L — AB (ref 1.15–1.40)
CHLORIDE: 100 mmol/L — AB (ref 101–111)
CHLORIDE: 104 mmol/L (ref 101–111)
CHLORIDE: 104 mmol/L (ref 101–111)
CHLORIDE: 106 mmol/L (ref 101–111)
CREATININE: 0.5 mg/dL (ref 0.44–1.00)
CREATININE: 0.5 mg/dL (ref 0.44–1.00)
CREATININE: 0.5 mg/dL (ref 0.44–1.00)
Calcium, Ion: 1.03 mmol/L — ABNORMAL LOW (ref 1.15–1.40)
Calcium, Ion: 1.08 mmol/L — ABNORMAL LOW (ref 1.15–1.40)
Calcium, Ion: 1.2 mmol/L (ref 1.15–1.40)
Calcium, Ion: 1.2 mmol/L (ref 1.15–1.40)
Chloride: 105 mmol/L (ref 101–111)
Chloride: 103 mmol/L (ref 101–111)
Creatinine, Ser: 0.5 mg/dL (ref 0.44–1.00)
Creatinine, Ser: 0.6 mg/dL (ref 0.44–1.00)
Creatinine, Ser: 0.7 mg/dL (ref 0.44–1.00)
GLUCOSE: 108 mg/dL — AB (ref 65–99)
GLUCOSE: 122 mg/dL — AB (ref 65–99)
GLUCOSE: 127 mg/dL — AB (ref 65–99)
GLUCOSE: 134 mg/dL — AB (ref 65–99)
Glucose, Bld: 103 mg/dL — ABNORMAL HIGH (ref 65–99)
Glucose, Bld: 112 mg/dL — ABNORMAL HIGH (ref 65–99)
HCT: 24 % — ABNORMAL LOW (ref 36.0–46.0)
HCT: 24 % — ABNORMAL LOW (ref 36.0–46.0)
HCT: 24 % — ABNORMAL LOW (ref 36.0–46.0)
HCT: 27 % — ABNORMAL LOW (ref 36.0–46.0)
HEMATOCRIT: 30 % — AB (ref 36.0–46.0)
HEMATOCRIT: 28 % — AB (ref 36.0–46.0)
HEMOGLOBIN: 8.2 g/dL — AB (ref 12.0–15.0)
Hemoglobin: 10.2 g/dL — ABNORMAL LOW (ref 12.0–15.0)
Hemoglobin: 8.2 g/dL — ABNORMAL LOW (ref 12.0–15.0)
Hemoglobin: 8.2 g/dL — ABNORMAL LOW (ref 12.0–15.0)
Hemoglobin: 9.2 g/dL — ABNORMAL LOW (ref 12.0–15.0)
Hemoglobin: 9.5 g/dL — ABNORMAL LOW (ref 12.0–15.0)
POTASSIUM: 3.2 mmol/L — AB (ref 3.5–5.1)
POTASSIUM: 3.3 mmol/L — AB (ref 3.5–5.1)
POTASSIUM: 4.4 mmol/L (ref 3.5–5.1)
POTASSIUM: 5 mmol/L (ref 3.5–5.1)
Potassium: 3.3 mmol/L — ABNORMAL LOW (ref 3.5–5.1)
Potassium: 4.3 mmol/L (ref 3.5–5.1)
SODIUM: 141 mmol/L (ref 135–145)
SODIUM: 141 mmol/L (ref 135–145)
Sodium: 141 mmol/L (ref 135–145)
Sodium: 139 mmol/L (ref 135–145)
Sodium: 140 mmol/L (ref 135–145)
Sodium: 140 mmol/L (ref 135–145)
TCO2: 26 mmol/L (ref 22–32)
TCO2: 27 mmol/L (ref 22–32)
TCO2: 29 mmol/L (ref 22–32)
TCO2: 29 mmol/L (ref 22–32)
TCO2: 30 mmol/L (ref 22–32)
TCO2: 30 mmol/L (ref 22–32)

## 2016-12-01 LAB — POCT I-STAT 3, ART BLOOD GAS (G3+)
ACID-BASE EXCESS: 5 mmol/L — AB (ref 0.0–2.0)
ACID-BASE EXCESS: 1 mmol/L (ref 0.0–2.0)
Acid-Base Excess: 5 mmol/L — ABNORMAL HIGH (ref 0.0–2.0)
BICARBONATE: 26.6 mmol/L (ref 20.0–28.0)
BICARBONATE: 29.2 mmol/L — AB (ref 20.0–28.0)
BICARBONATE: 29.8 mmol/L — AB (ref 20.0–28.0)
Bicarbonate: 27.5 mmol/L (ref 20.0–28.0)
Bicarbonate: 26.4 mmol/L (ref 20.0–28.0)
O2 SAT: 100 %
O2 SAT: 99 %
O2 Saturation: 100 %
O2 Saturation: 95 %
O2 Saturation: 96 %
PCO2 ART: 50.2 mmHg — AB (ref 32.0–48.0)
PCO2 ART: 52.5 mmHg — AB (ref 32.0–48.0)
PH ART: 7.314 — AB (ref 7.350–7.450)
PH ART: 7.332 — AB (ref 7.350–7.450)
PH ART: 7.36 (ref 7.350–7.450)
PH ART: 7.453 — AB (ref 7.350–7.450)
PH ART: 7.493 — AB (ref 7.350–7.450)
PO2 ART: 311 mmHg — AB (ref 83.0–108.0)
Patient temperature: 37.2
Patient temperature: 37.6
TCO2: 28 mmol/L (ref 22–32)
TCO2: 28 mmol/L (ref 22–32)
TCO2: 29 mmol/L (ref 22–32)
TCO2: 30 mmol/L (ref 22–32)
TCO2: 31 mmol/L (ref 22–32)
pCO2 arterial: 38 mmHg (ref 32.0–48.0)
pCO2 arterial: 42.6 mmHg (ref 32.0–48.0)
pCO2 arterial: 48.5 mmHg — ABNORMAL HIGH (ref 32.0–48.0)
pO2, Arterial: 150 mmHg — ABNORMAL HIGH (ref 83.0–108.0)
pO2, Arterial: 308 mmHg — ABNORMAL HIGH (ref 83.0–108.0)
pO2, Arterial: 82 mmHg — ABNORMAL LOW (ref 83.0–108.0)
pO2, Arterial: 94 mmHg (ref 83.0–108.0)

## 2016-12-01 LAB — BASIC METABOLIC PANEL
Anion gap: 9 (ref 5–15)
BUN: 10 mg/dL (ref 6–20)
CALCIUM: 9 mg/dL (ref 8.9–10.3)
CO2: 26 mmol/L (ref 22–32)
CREATININE: 0.7 mg/dL (ref 0.44–1.00)
Chloride: 103 mmol/L (ref 101–111)
GFR calc Af Amer: >60 mL/min (ref >60)
GLUCOSE: 116 mg/dL — AB (ref 65–99)
Potassium: 3.2 mmol/L — ABNORMAL LOW (ref 3.5–5.1)
Sodium: 138 mmol/L (ref 135–145)

## 2016-12-01 LAB — APTT: APTT: 32 s (ref 24–36)

## 2016-12-01 LAB — HEMOGLOBIN AND HEMATOCRIT, BLOOD
HCT: 25 % — ABNORMAL LOW (ref 36.0–46.0)
Hemoglobin: 8.5 g/dL — ABNORMAL LOW (ref 12.0–15.0)

## 2016-12-01 LAB — POCT I-STAT 4, (NA,K, GLUC, HGB,HCT)
GLUCOSE: 139 mg/dL — AB (ref 65–99)
HEMATOCRIT: 30 % — AB (ref 36.0–46.0)
HEMOGLOBIN: 10.2 g/dL — AB (ref 12.0–15.0)
POTASSIUM: 4.1 mmol/L (ref 3.5–5.1)
Sodium: 140 mmol/L (ref 135–145)

## 2016-12-01 LAB — GLUCOSE, CAPILLARY
GLUCOSE-CAPILLARY: 138 mg/dL — AB (ref 65–99)
GLUCOSE-CAPILLARY: 125 mg/dL — AB (ref 65–99)
Glucose-Capillary: 114 mg/dL — ABNORMAL HIGH (ref 65–99)
Glucose-Capillary: 111 mg/dL — ABNORMAL HIGH (ref 65–99)

## 2016-12-01 LAB — HEPARIN LEVEL (UNFRACTIONATED): Heparin Unfractionated: 0.26 IU/mL — ABNORMAL LOW (ref 0.30–0.70)

## 2016-12-01 LAB — PROTIME-INR
INR: 1.47
PROTHROMBIN TIME: 17.7 s — AB (ref 11.4–15.2)

## 2016-12-01 LAB — MAGNESIUM: Magnesium: 2 mg/dL (ref 1.7–2.4)

## 2016-12-01 LAB — PLATELET COUNT: Platelets: 165 10*3/uL (ref 150–400)

## 2016-12-01 SURGERY — CORONARY ARTERY BYPASS GRAFTING (CABG)
Anesthesia: General | Site: Chest

## 2016-12-01 MED ORDER — LACTATED RINGERS IV SOLN
INTRAVENOUS | Status: DC | PRN
  Administered 2016-12-01 (×4): via INTRAVENOUS

## 2016-12-01 MED ORDER — POTASSIUM CHLORIDE 10 MEQ/50ML IV SOLN: 10.0000 meq | INTRAVENOUS | Status: AC

## 2016-12-01 MED ORDER — ORAL CARE MOUTH RINSE
15.0000 mL | Freq: Four times a day (QID) | OROMUCOSAL | Status: DC
  Administered 2016-12-01: 15 mL via OROMUCOSAL

## 2016-12-01 MED ORDER — METOPROLOL TARTRATE 25 MG/10 ML ORAL SUSPENSION: 12.5000 mg | Freq: Two times a day (BID) | ORAL | Status: DC

## 2016-12-01 MED ORDER — INSULIN ASPART 100 UNIT/ML ~~LOC~~ SOLN
0.0000 [IU] | SUBCUTANEOUS | Status: DC
  Administered 2016-12-02: 2 [IU] via SUBCUTANEOUS

## 2016-12-01 MED ORDER — 0.9 % SODIUM CHLORIDE (POUR BTL) OPTIME
TOPICAL | Status: DC | PRN
  Administered 2016-12-01: 6000 mL

## 2016-12-01 MED ORDER — VANCOMYCIN HCL IN DEXTROSE 1-5 GM/200ML-% IV SOLN
1000.0000 mg | Freq: Once | INTRAVENOUS | Status: AC
  Administered 2016-12-02: 1000 mg via INTRAVENOUS
  Filled 2016-12-01: qty 200

## 2016-12-01 MED ORDER — INSULIN REGULAR BOLUS VIA INFUSION
0.0000 [IU] | Freq: Three times a day (TID) | INTRAVENOUS | Status: DC
  Filled 2016-12-01: qty 10

## 2016-12-01 MED ORDER — ALBUMIN HUMAN 5 % IV SOLN
250.0000 mL | INTRAVENOUS | Status: AC | PRN
  Administered 2016-12-01 (×2): 250 mL via INTRAVENOUS
  Filled 2016-12-01: qty 250

## 2016-12-01 MED ORDER — MORPHINE SULFATE (PF) 4 MG/ML IV SOLN
1.0000 mg | INTRAVENOUS | Status: AC | PRN
  Administered 2016-12-01 (×2): 4 mg via INTRAVENOUS
  Filled 2016-12-01: qty 1

## 2016-12-01 MED ORDER — NITROGLYCERIN IN D5W 200-5 MCG/ML-% IV SOLN
0.0000 ug/min | INTRAVENOUS | Status: DC
Start: 2016-12-01 — End: 2016-12-03
  Administered 2016-12-02: 100 ug/min via INTRAVENOUS
  Filled 2016-12-01: qty 250

## 2016-12-01 MED ORDER — MIDAZOLAM HCL 2 MG/2ML IJ SOLN
2.0000 mg | INTRAMUSCULAR | Status: DC | PRN
  Administered 2016-12-01: 2 mg via INTRAVENOUS
  Filled 2016-12-01: qty 2

## 2016-12-01 MED ORDER — ASPIRIN EC 325 MG PO TBEC
325.0000 mg | DELAYED_RELEASE_TABLET | Freq: Every day | ORAL | Status: DC
  Administered 2016-12-02 – 2016-12-08 (×7): 325 mg via ORAL
  Filled 2016-12-01 (×7): qty 1

## 2016-12-01 MED ORDER — METOPROLOL TARTRATE 12.5 MG HALF TABLET: 12.5000 mg | ORAL_TABLET | Freq: Two times a day (BID) | ORAL | Status: DC

## 2016-12-01 MED ORDER — TRAMADOL HCL 50 MG PO TABS
50.0000 mg | ORAL_TABLET | ORAL | Status: DC | PRN
  Administered 2016-12-02: 50 mg via ORAL
  Administered 2016-12-03 – 2016-12-07 (×2): 100 mg via ORAL
  Filled 2016-12-01: qty 2
  Filled 2016-12-01: qty 1
  Filled 2016-12-01: qty 2

## 2016-12-01 MED ORDER — DEXTROSE 5 % IV SOLN
1.5000 g | Freq: Two times a day (BID) | INTRAVENOUS | Status: AC
  Administered 2016-12-01 – 2016-12-03 (×4): 1.5 g via INTRAVENOUS
  Filled 2016-12-01 (×4): qty 1.5

## 2016-12-01 MED ORDER — OXYCODONE HCL 5 MG PO TABS
5.0000 mg | ORAL_TABLET | ORAL | Status: DC | PRN
  Administered 2016-12-02 (×2): 5 mg via ORAL
  Administered 2016-12-03 – 2016-12-08 (×5): 10 mg via ORAL
  Filled 2016-12-01: qty 2
  Filled 2016-12-01: qty 1
  Filled 2016-12-01 (×4): qty 2
  Filled 2016-12-01: qty 1
  Filled 2016-12-01: qty 2

## 2016-12-01 MED ORDER — PROPOFOL 10 MG/ML IV BOLUS
INTRAVENOUS | Status: AC
  Filled 2016-12-01: qty 20

## 2016-12-01 MED ORDER — SODIUM CHLORIDE 0.9 % IV SOLN: INTRAVENOUS | Status: DC

## 2016-12-01 MED ORDER — DOCUSATE SODIUM 100 MG PO CAPS
200.0000 mg | ORAL_CAPSULE | Freq: Every day | ORAL | Status: DC
  Administered 2016-12-02 – 2016-12-08 (×7): 200 mg via ORAL
  Filled 2016-12-01 (×7): qty 2

## 2016-12-01 MED ORDER — SODIUM CHLORIDE 0.9 % IV SOLN
INTRAVENOUS | Status: DC
  Filled 2016-12-01: qty 1

## 2016-12-01 MED ORDER — ACETAMINOPHEN 160 MG/5ML PO SOLN: 650.0000 mg | Freq: Once | ORAL | Status: AC

## 2016-12-01 MED ORDER — HEMOSTATIC AGENTS (NO CHARGE) OPTIME
TOPICAL | Status: DC | PRN
  Administered 2016-12-01: 1 via TOPICAL

## 2016-12-01 MED ORDER — SODIUM CHLORIDE 0.9% FLUSH: 3.0000 mL | INTRAVENOUS | Status: DC | PRN

## 2016-12-01 MED ORDER — ASPIRIN 81 MG PO CHEW
324.0000 mg | CHEWABLE_TABLET | Freq: Every day | ORAL | Status: DC
  Filled 2016-12-01 (×2): qty 4

## 2016-12-01 MED ORDER — BISACODYL 5 MG PO TBEC
10.0000 mg | DELAYED_RELEASE_TABLET | Freq: Every day | ORAL | Status: DC
  Administered 2016-12-02 – 2016-12-08 (×7): 10 mg via ORAL
  Filled 2016-12-01 (×7): qty 2

## 2016-12-01 MED ORDER — ACETAMINOPHEN 160 MG/5ML PO SOLN
1000.0000 mg | Freq: Four times a day (QID) | ORAL | Status: DC
  Filled 2016-12-01: qty 40.6

## 2016-12-01 MED ORDER — LACTATED RINGERS IV SOLN: 500.0000 mL | Freq: Once | INTRAVENOUS | Status: DC | PRN

## 2016-12-01 MED ORDER — CHLORHEXIDINE GLUCONATE 0.12% ORAL RINSE (MEDLINE KIT)
15.0000 mL | Freq: Two times a day (BID) | OROMUCOSAL | Status: DC
  Administered 2016-12-01: 15 mL via OROMUCOSAL

## 2016-12-01 MED ORDER — HEPARIN SODIUM (PORCINE) 1000 UNIT/ML IJ SOLN
INTRAMUSCULAR | Status: DC | PRN
  Administered 2016-12-01: 26000 [IU] via INTRAVENOUS
  Administered 2016-12-01: 2000 [IU] via INTRAVENOUS

## 2016-12-01 MED ORDER — ACETAMINOPHEN 650 MG RE SUPP
650.0000 mg | Freq: Once | RECTAL | Status: AC
  Administered 2016-12-01: 650 mg via RECTAL

## 2016-12-01 MED ORDER — MIDAZOLAM HCL 5 MG/5ML IJ SOLN
INTRAMUSCULAR | Status: DC | PRN
  Administered 2016-12-01: 1 mg via INTRAVENOUS
  Administered 2016-12-01 (×3): 2 mg via INTRAVENOUS
  Administered 2016-12-01: 1 mg via INTRAVENOUS
  Administered 2016-12-01: 2 mg via INTRAVENOUS

## 2016-12-01 MED ORDER — SODIUM CHLORIDE 0.9 % IV SOLN
0.0000 ug/min | INTRAVENOUS | Status: DC
  Filled 2016-12-01: qty 2

## 2016-12-01 MED ORDER — SODIUM CHLORIDE 0.9 % IV SOLN: 250.0000 mL | INTRAVENOUS | Status: DC

## 2016-12-01 MED ORDER — HEPARIN SODIUM (PORCINE) 1000 UNIT/ML IJ SOLN
INTRAMUSCULAR | Status: AC
  Filled 2016-12-01: qty 1

## 2016-12-01 MED ORDER — METOCLOPRAMIDE HCL 5 MG/ML IJ SOLN
10.0000 mg | Freq: Four times a day (QID) | INTRAMUSCULAR | Status: AC
  Administered 2016-12-01 – 2016-12-02 (×4): 10 mg via INTRAVENOUS
  Filled 2016-12-01 (×3): qty 2

## 2016-12-01 MED ORDER — SODIUM CHLORIDE 0.9 % IV SOLN
0.0000 ug/kg/h | INTRAVENOUS | Status: DC
  Filled 2016-12-01 (×2): qty 2

## 2016-12-01 MED ORDER — SODIUM CHLORIDE 0.9% FLUSH
3.0000 mL | Freq: Two times a day (BID) | INTRAVENOUS | Status: DC
  Administered 2016-12-02: 3 mL via INTRAVENOUS

## 2016-12-01 MED ORDER — FENTANYL CITRATE (PF) 250 MCG/5ML IJ SOLN
INTRAMUSCULAR | Status: AC
Start: 2016-12-01 — End: 2016-12-01
  Filled 2016-12-01: qty 20

## 2016-12-01 MED ORDER — MORPHINE SULFATE (PF) 4 MG/ML IV SOLN
2.0000 mg | INTRAVENOUS | Status: DC | PRN
  Administered 2016-12-01: 2 mg via INTRAVENOUS
  Administered 2016-12-02: 4 mg via INTRAVENOUS
  Administered 2016-12-02: 2 mg via INTRAVENOUS
  Filled 2016-12-01 (×4): qty 1

## 2016-12-01 MED ORDER — ONDANSETRON HCL 4 MG/2ML IJ SOLN
4.0000 mg | Freq: Four times a day (QID) | INTRAMUSCULAR | Status: DC | PRN
  Administered 2016-12-03 – 2016-12-07 (×2): 4 mg via INTRAVENOUS
  Filled 2016-12-01 (×2): qty 2

## 2016-12-01 MED ORDER — FENTANYL CITRATE (PF) 100 MCG/2ML IJ SOLN
INTRAMUSCULAR | Status: AC
  Filled 2016-12-01: qty 2

## 2016-12-01 MED ORDER — SODIUM CHLORIDE 0.45 % IV SOLN: INTRAVENOUS | Status: DC | PRN

## 2016-12-01 MED ORDER — METOPROLOL TARTRATE 5 MG/5ML IV SOLN
2.5000 mg | INTRAVENOUS | Status: DC | PRN
  Administered 2016-12-01 – 2016-12-02 (×2): 5 mg via INTRAVENOUS
  Filled 2016-12-01 (×3): qty 5

## 2016-12-01 MED ORDER — DOPAMINE-DEXTROSE 3.2-5 MG/ML-% IV SOLN
1.5000 ug/kg/min | INTRAVENOUS | Status: DC
  Administered 2016-12-01: 3 ug/kg/min via INTRAVENOUS

## 2016-12-01 MED ORDER — BISACODYL 10 MG RE SUPP: 10.0000 mg | Freq: Every day | RECTAL | Status: DC

## 2016-12-01 MED ORDER — MAGNESIUM SULFATE 4 GM/100ML IV SOLN
4.0000 g | Freq: Once | INTRAVENOUS | Status: AC
  Administered 2016-12-01: 4 g via INTRAVENOUS
  Filled 2016-12-01: qty 100

## 2016-12-01 MED ORDER — SODIUM CHLORIDE 0.9 % IJ SOLN
INTRAMUSCULAR | Status: DC | PRN
  Administered 2016-12-01 (×3): 4 mL via TOPICAL

## 2016-12-01 MED ORDER — LACTATED RINGERS IV SOLN: INTRAVENOUS | Status: DC

## 2016-12-01 MED ORDER — PROTAMINE SULFATE 10 MG/ML IV SOLN
INTRAVENOUS | Status: DC | PRN
  Administered 2016-12-01: 280 mg via INTRAVENOUS

## 2016-12-01 MED ORDER — PROPOFOL 10 MG/ML IV BOLUS
INTRAVENOUS | Status: DC | PRN
  Administered 2016-12-01: 50 mg via INTRAVENOUS

## 2016-12-01 MED ORDER — PROTAMINE SULFATE 10 MG/ML IV SOLN
INTRAVENOUS | Status: AC
  Filled 2016-12-01: qty 25

## 2016-12-01 MED ORDER — FAMOTIDINE IN NACL 20-0.9 MG/50ML-% IV SOLN
20.0000 mg | Freq: Two times a day (BID) | INTRAVENOUS | Status: AC
  Administered 2016-12-01: 20 mg via INTRAVENOUS

## 2016-12-01 MED ORDER — MIDAZOLAM HCL 10 MG/2ML IJ SOLN
INTRAMUSCULAR | Status: AC
  Filled 2016-12-01: qty 2

## 2016-12-01 MED ORDER — PANTOPRAZOLE SODIUM 40 MG PO TBEC
40.0000 mg | DELAYED_RELEASE_TABLET | Freq: Every day | ORAL | Status: DC
  Administered 2016-12-03 – 2016-12-08 (×6): 40 mg via ORAL
  Filled 2016-12-01 (×6): qty 1

## 2016-12-01 MED ORDER — FENTANYL CITRATE (PF) 250 MCG/5ML IJ SOLN
INTRAMUSCULAR | Status: DC | PRN
  Administered 2016-12-01: 200 ug via INTRAVENOUS
  Administered 2016-12-01: 50 ug via INTRAVENOUS
  Administered 2016-12-01: 100 ug via INTRAVENOUS
  Administered 2016-12-01: 150 ug via INTRAVENOUS
  Administered 2016-12-01: 100 ug via INTRAVENOUS
  Administered 2016-12-01: 400 ug via INTRAVENOUS

## 2016-12-01 MED ORDER — PHENYLEPHRINE HCL 10 MG/ML IJ SOLN
INTRAMUSCULAR | Status: DC | PRN
  Administered 2016-12-01: 25 ug/min via INTRAVENOUS

## 2016-12-01 MED ORDER — ROCURONIUM BROMIDE 10 MG/ML (PF) SYRINGE
PREFILLED_SYRINGE | INTRAVENOUS | Status: AC
  Filled 2016-12-01: qty 5

## 2016-12-01 MED ORDER — CHLORHEXIDINE GLUCONATE 0.12 % MT SOLN
15.0000 mL | OROMUCOSAL | Status: AC
  Administered 2016-12-01: 15 mL via OROMUCOSAL

## 2016-12-01 MED ORDER — ROCURONIUM BROMIDE 100 MG/10ML IV SOLN
INTRAVENOUS | Status: DC | PRN
  Administered 2016-12-01: 50 mg via INTRAVENOUS
  Administered 2016-12-01: 10 mg via INTRAVENOUS
  Administered 2016-12-01: 40 mg via INTRAVENOUS
  Administered 2016-12-01 (×2): 50 mg via INTRAVENOUS

## 2016-12-01 MED ORDER — MIDAZOLAM HCL 2 MG/2ML IJ SOLN
INTRAMUSCULAR | Status: AC
  Filled 2016-12-01: qty 2

## 2016-12-01 MED ORDER — ACETAMINOPHEN 500 MG PO TABS
1000.0000 mg | ORAL_TABLET | Freq: Four times a day (QID) | ORAL | Status: DC
  Administered 2016-12-02 – 2016-12-06 (×17): 1000 mg via ORAL
  Filled 2016-12-01 (×19): qty 2

## 2016-12-01 SURGICAL SUPPLY — 109 items
APPLICATOR COTTON TIP 6IN STRL (MISCELLANEOUS) ×3 IMPLANT
BAG DECANTER FOR FLEXI CONT (MISCELLANEOUS) ×3 IMPLANT
BANDAGE ACE 4X5 VEL STRL LF (GAUZE/BANDAGES/DRESSINGS) ×3 IMPLANT
BANDAGE ACE 6X5 VEL STRL LF (GAUZE/BANDAGES/DRESSINGS) ×3 IMPLANT
BASKET HEART  (ORDER IN 25'S) (MISCELLANEOUS) ×1
BASKET HEART (ORDER IN 25'S) (MISCELLANEOUS) ×1
BASKET HEART (ORDER IN 25S) (MISCELLANEOUS) ×1 IMPLANT
BLADE STERNUM SYSTEM 6 (BLADE) ×3 IMPLANT
BLADE SURG 11 STRL SS (BLADE) ×3 IMPLANT
BNDG GAUZE ELAST 4 BULKY (GAUZE/BANDAGES/DRESSINGS) ×3 IMPLANT
CANISTER SUCT 3000ML PPV (MISCELLANEOUS) ×3 IMPLANT
CANNULA EZ GLIDE AORTIC 21FR (CANNULA) ×3 IMPLANT
CATH CPB KIT HENDRICKSON (MISCELLANEOUS) ×3 IMPLANT
CATH ROBINSON RED A/P 18FR (CATHETERS) ×3 IMPLANT
CATH THORACIC 36FR (CATHETERS) ×3 IMPLANT
CATH THORACIC 36FR RT ANG (CATHETERS) ×3 IMPLANT
CLIP FOGARTY SPRING 6M (CLIP) ×3 IMPLANT
CLIP VESOCCLUDE MED 24/CT (CLIP) IMPLANT
CLIP VESOCCLUDE SM WIDE 24/CT (CLIP) ×9 IMPLANT
COVER MAYO STAND STRL (DRAPES) ×3 IMPLANT
CRADLE DONUT ADULT HEAD (MISCELLANEOUS) ×3 IMPLANT
DERMABOND ADVANCED (GAUZE/BANDAGES/DRESSINGS) ×2
DERMABOND ADVANCED .7 DNX12 (GAUZE/BANDAGES/DRESSINGS) ×1 IMPLANT
DRAPE CARDIOVASCULAR INCISE (DRAPES) ×2
DRAPE SLUSH/WARMER DISC (DRAPES) ×3 IMPLANT
DRAPE SRG 135X102X78XABS (DRAPES) ×1 IMPLANT
DRSG COVADERM 4X14 (GAUZE/BANDAGES/DRESSINGS) ×3 IMPLANT
ELECT REM PT RETURN 9FT ADLT (ELECTROSURGICAL) ×6
ELECTRODE REM PT RTRN 9FT ADLT (ELECTROSURGICAL) ×2 IMPLANT
FELT TEFLON 1X6 (MISCELLANEOUS) ×6 IMPLANT
GAUZE SPONGE 4X4 12PLY STRL (GAUZE/BANDAGES/DRESSINGS) ×3 IMPLANT
GAUZE SPONGE 4X4 12PLY STRL LF (GAUZE/BANDAGES/DRESSINGS) ×3 IMPLANT
GLOVE BIO SURGEON STRL SZ 6.5 (GLOVE) ×6 IMPLANT
GLOVE BIO SURGEON STRL SZ7 (GLOVE) ×12 IMPLANT
GLOVE BIO SURGEONS STRL SZ 6.5 (GLOVE) ×3
GLOVE BIOGEL PI IND STRL 6.5 (GLOVE) ×1 IMPLANT
GLOVE BIOGEL PI INDICATOR 6.5 (GLOVE) ×2
GLOVE SURG SIGNA 7.5 PF LTX (GLOVE) ×9 IMPLANT
GOWN STRL REUS W/ TWL LRG LVL3 (GOWN DISPOSABLE) ×8 IMPLANT
GOWN STRL REUS W/ TWL XL LVL3 (GOWN DISPOSABLE) ×2 IMPLANT
GOWN STRL REUS W/TWL LRG LVL3 (GOWN DISPOSABLE) ×16
GOWN STRL REUS W/TWL XL LVL3 (GOWN DISPOSABLE) ×4
HEMOSTAT POWDER SURGIFOAM 1G (HEMOSTASIS) ×9 IMPLANT
HEMOSTAT SURGICEL 2X14 (HEMOSTASIS) ×3 IMPLANT
INSERT FOGARTY XLG (MISCELLANEOUS) IMPLANT
KIT BASIN OR (CUSTOM PROCEDURE TRAY) ×3 IMPLANT
KIT ROOM TURNOVER OR (KITS) ×3 IMPLANT
KIT SUCTION CATH 14FR (SUCTIONS) ×6 IMPLANT
KIT VASOVIEW HEMOPRO VH 3000 (KITS) ×3 IMPLANT
MARKER GRAFT CORONARY BYPASS (MISCELLANEOUS) ×9 IMPLANT
NS IRRIG 1000ML POUR BTL (IV SOLUTION) ×18 IMPLANT
PACK OPEN HEART (CUSTOM PROCEDURE TRAY) ×3 IMPLANT
PAD ARMBOARD 7.5X6 YLW CONV (MISCELLANEOUS) ×6 IMPLANT
PAD ELECT DEFIB RADIOL ZOLL (MISCELLANEOUS) ×3 IMPLANT
PENCIL BUTTON HOLSTER BLD 10FT (ELECTRODE) ×3 IMPLANT
PUNCH AORTIC ROTATE 4.0MM (MISCELLANEOUS) IMPLANT
PUNCH AORTIC ROTATE 4.5MM 8IN (MISCELLANEOUS) ×3 IMPLANT
PUNCH AORTIC ROTATE 5MM 8IN (MISCELLANEOUS) IMPLANT
SET CARDIOPLEGIA MPS 5001102 (MISCELLANEOUS) ×3 IMPLANT
SLEEVE SURGEON STRL (DRAPES) ×3 IMPLANT
SOLUTION ANTI FOG 6CC (MISCELLANEOUS) ×3 IMPLANT
SPONGE LAP 4X18 X RAY DECT (DISPOSABLE) ×3 IMPLANT
SUT BONE WAX W31G (SUTURE) ×3 IMPLANT
SUT MNCRL AB 4-0 PS2 18 (SUTURE) ×3 IMPLANT
SUT PROLENE 3 0 SH DA (SUTURE) ×3 IMPLANT
SUT PROLENE 4 0 RB 1 (SUTURE) ×8
SUT PROLENE 4 0 SH DA (SUTURE) IMPLANT
SUT PROLENE 4-0 RB1 .5 CRCL 36 (SUTURE) ×4 IMPLANT
SUT PROLENE 5 0 C 1 36 (SUTURE) ×6 IMPLANT
SUT PROLENE 6 0 C 1 30 (SUTURE) ×18 IMPLANT
SUT PROLENE 7 0 BV 1 (SUTURE) ×3 IMPLANT
SUT PROLENE 7 0 BV1 MDA (SUTURE) ×9 IMPLANT
SUT PROLENE 8 0 BV175 6 (SUTURE) ×6 IMPLANT
SUT SILK  1 MH (SUTURE) ×6
SUT SILK 1 MH (SUTURE) ×3 IMPLANT
SUT SILK 1 TIES 10X30 (SUTURE) ×3 IMPLANT
SUT SILK 2 0 SH CR/8 (SUTURE) ×6 IMPLANT
SUT SILK 2 0 TIES 10X30 (SUTURE) ×3 IMPLANT
SUT SILK 2 0 TIES 17X18 (SUTURE) ×2
SUT SILK 2-0 18XBRD TIE BLK (SUTURE) ×1 IMPLANT
SUT SILK 3 0 SH CR/8 (SUTURE) ×3 IMPLANT
SUT SILK 4 0 TIE 10X30 (SUTURE) ×6 IMPLANT
SUT STEEL 6MS V (SUTURE) ×3 IMPLANT
SUT STEEL STERNAL CCS#1 18IN (SUTURE) IMPLANT
SUT STEEL SZ 6 DBL 3X14 BALL (SUTURE) ×3 IMPLANT
SUT TEM PAC WIRE 2 0 SH (SUTURE) ×12 IMPLANT
SUT VIC AB 1 CTX 36 (SUTURE) ×4
SUT VIC AB 1 CTX36XBRD ANBCTR (SUTURE) ×2 IMPLANT
SUT VIC AB 2-0 CT1 27 (SUTURE) ×2
SUT VIC AB 2-0 CT1 TAPERPNT 27 (SUTURE) ×1 IMPLANT
SUT VIC AB 2-0 CTX 27 (SUTURE) ×6 IMPLANT
SUT VIC AB 3-0 SH 27 (SUTURE)
SUT VIC AB 3-0 SH 27X BRD (SUTURE) IMPLANT
SUT VIC AB 3-0 X1 27 (SUTURE) ×6 IMPLANT
SUT VICRYL 4-0 PS2 18IN ABS (SUTURE) IMPLANT
SUTURE E-PAK OPEN HEART (SUTURE) ×3 IMPLANT
SYSTEM SAHARA CHEST DRAIN ATS (WOUND CARE) ×3 IMPLANT
TAPE CLOTH SURG 4X10 WHT LF (GAUZE/BANDAGES/DRESSINGS) ×3 IMPLANT
TAPE PAPER 2X10 WHT MICROPORE (GAUZE/BANDAGES/DRESSINGS) ×3 IMPLANT
TOWEL GREEN STERILE (TOWEL DISPOSABLE) ×3 IMPLANT
TOWEL GREEN STERILE FF (TOWEL DISPOSABLE) ×3 IMPLANT
TOWEL OR 17X24 6PK STRL BLUE (TOWEL DISPOSABLE) ×6 IMPLANT
TOWEL OR 17X26 10 PK STRL BLUE (TOWEL DISPOSABLE) ×6 IMPLANT
TRAY FOLEY SILVER 16FR TEMP (SET/KITS/TRAYS/PACK) ×3 IMPLANT
TUBE FEEDING 8FR 16IN STR KANG (MISCELLANEOUS) ×3 IMPLANT
TUBING INSUFFLATION (TUBING) ×3 IMPLANT
UNDERPAD 30X30 (UNDERPADS AND DIAPERS) ×3 IMPLANT
WATER STERILE IRR 1000ML POUR (IV SOLUTION) ×6 IMPLANT
YANKAUER SUCT BULB TIP NO VENT (SUCTIONS) ×3 IMPLANT

## 2016-12-01 NOTE — Procedures (Signed)
Extubation Procedure Note  Patient Details:   Name: [name] DOB: 1950/10/28 MRN: [id number]  Patient extubated to 4L [place]. Patient able to get NIF -30 and VC 900. Patient tolerated well. No stridor noted. RN at beside doing IS with patient.     Evaluation  O2 sats: stable throughout Complications: No apparent complications Patient did tolerate procedure well. Bilateral Breath Sounds: Rhonchi   Yes  Kelle Darting 12/01/2016, 2255

## 2016-12-01 NOTE — Brief Op Note (Addendum)
11/27/2016 - 12/01/2016  4:07 PM  PATIENT:  [name]  66 y.o. female  PRE-OPERATIVE DIAGNOSIS:  3 vessel CAD, s/p MI  POST-OPERATIVE DIAGNOSIS:  3 vessel CAD, s/p MI  PROCEDURE:  Procedure(s):  CORONARY ARTERY BYPASS GRAFTING x 4 -LIMA to LAD -SVG to DIAGONAL -SVG to OM2 -SVG to PDA  ENDOSCOPIC HARVEST GREATER SAPHENOUS VEIN -Right Leg  INTRAOPERATIVE TRANSESOPHAGEAL ECHOCARDIOGRAM (N/A)  SURGEON:  Surgeon(s) and Role:    * Melrose Nakayama, MD - Primary  PHYSICIAN ASSISTANT: Ellwood Handler PA-C  ANESTHESIA:   general  EBL:  Total I/O In: 192.5 [I.V.:142.5; IV Piggyback:50] Out: 900 [Urine:900]  BLOOD ADMINISTERED: CELLSAVER  DRAINS: Left Pleural Chest Tubes, Mediastinal Chest Drains   LOCAL MEDICATIONS USED:  NONE  SPECIMEN:  No Specimen  DISPOSITION OF SPECIMEN:  N/A  COUNTS:  YES  TOURNIQUET:  * No tourniquets in log *  DICTATION: .Dragon Dictation  PLAN OF CARE: Admit to inpatient   PATIENT DISPOSITION:  ICU - intubated and hemodynamically stable.   Delay start of Pharmacological VTE agent (>24hrs) due to surgical blood loss or risk of bleeding: yes  Good targets, good conduits. Preserved LV function by TEE

## 2016-12-01 NOTE — Interval H&P Note (Signed)
History and Physical Interval Note: Stable weekend. For CABG today All questions answered  12/01/2016 12:09 PM  [name]  has presented today for surgery, with the diagnosis of CAD  The various methods of treatment have been discussed with the patient and family. After consideration of risks, benefits and other options for treatment, the patient has consented to  Procedure(s): CORONARY ARTERY BYPASS GRAFTING (CABG) (N/A) INTRAOPERATIVE TRANSESOPHAGEAL ECHOCARDIOGRAM (N/A) as a surgical intervention .  The patient's history has been reviewed, patient examined, no change in status, stable for surgery.  I have reviewed the patient's chart and labs.  Questions were answered to the patient's satisfaction.     Melrose Nakayama

## 2016-12-01 NOTE — Progress Notes (Addendum)
ANTICOAGULATION CONSULT NOTE - Follow Up Consult  Pharmacy Consult for Heparin Indication: post-cath  No Known Allergies  Patient Measurements: Height: 5\' 7"  (170.2 cm) Weight: 206 lb 9.1 oz (93.7 kg) IBW/kg (Calculated) : 61.6  Vital Signs: Temp: 98.7 F (37.1 C) (09/23 2337) Temp Source: Oral (09/23 2337) BP: 161/92 (09/24 0200) Pulse Rate: 83 (09/24 0200)  Labs:  Recent Labs  11/28/16 1241  11/29/16 0431 11/30/16 0442 12/01/16 0239  HGB 12.1  --  11.5* 11.6* 11.8*  HCT 37.5  --  34.8* 35.2* 36.3  PLT 297  --  286 264 252  HEPARINUNFRC 0.25*  < > 0.42 0.21* 0.26*  CREATININE 0.66  --  0.63 0.68 0.70  TROPONINI 1.00*  --   --   --   --   < > = values in this interval not displayed.  Estimated Creatinine Clearance: 83.4 mL/min (by C-G formula based on SCr of 0.7 mg/dL).   Medical History: Past Medical History:  Diagnosis Date  . Colon polyps      Assessment: 66 yo female STEMI, s/p heart cath with no intervention. TCTS consulted and plan for CABG on Monday 9/24.   Restarted Heparin post-cath. Heparin level subtherapeutic at 0.26 and no infusion issues per RN. CBC is stable and no s/s bleed noted.   Tirofiban started in cath lab for clot burden and is currently off.    Goal of Therapy:  Heparin level 0.3-0.7 units/ml Monitor platelets by anticoagulation protocol: Yes   Plan:  Increase heparin gtt to 1650 units/hr Heparin level in 6 hrs Daily heparin level and CBC Monitor for s/s bleeding F/u surgery plans   Argie Ramming, PharmD Clinical Pharmacist 12/01/16 3:50 AM

## 2016-12-01 NOTE — Transfer of Care (Signed)
Immediate Anesthesia Transfer of Care Note  Patient: [name]  Procedure(s) Performed: Procedure(s): CORONARY ARTERY BYPASS GRAFTING (CABG) x four , using left internal mammary artery and right leg greater saphenous vein harvested endoscopically - LIMA to LAD, -SVG to DIAGONAL, SVG to OM2, SVG to PDA  (N/A) INTRAOPERATIVE TRANSESOPHAGEAL ECHOCARDIOGRAM (N/A)  Patient Location: ICU  Anesthesia Type:General  Level of Consciousness: sedated and Patient remains intubated per anesthesia plan  Airway & Oxygen Therapy: Patient remains intubated per anesthesia plan and Patient placed on Ventilator (see vital sign flow sheet for setting)  Post-op Assessment: Report given to RN and Post -op Vital signs reviewed and stable  Post vital signs: Reviewed and stable BP 110/70, apaced HR 80; spo2 100%  Last Vitals:  Vitals:   12/01/16 0900 12/01/16 1000  BP: (!) 145/94 (!) 139/118  Pulse: 77 87  Resp: 15 (!) 24  Temp:    SpO2: 98% 97%    Last Pain:  Vitals:   11/30/16 2337  TempSrc: Oral  PainSc:       Patients Stated Pain Goal: 0 (12/28/09 7356)  Complications: No apparent anesthesia complications

## 2016-12-01 NOTE — Progress Notes (Signed)
Called by Radiologist CXR indicates ETT in Right Mainstem Bronchus.  Will need to pull tube back 2-2.5 cm.  Dr. Servando Snare notified.  Order given and intiated.

## 2016-12-01 NOTE — OR Nursing (Signed)
17:20 - 20 minute call to SICU charge nurse

## 2016-12-01 NOTE — H&P (View-Only) (Signed)
[address]       [place] [address]             [id number]        Shalunda B [facility] Medical Record #[id number] Date of Birth: 1950/08/13  Referring: Dr. Ellyn Hack Primary Care: No primary care provider on file.  Chief Complaint:    Chief Complaint  Patient presents with  . Chest Pain    History of Present Illness:     Mrs. Marlowe is a 66 year old female with a past medical history of essential hypertension and hyperlipidemia who was returning to her car after work when she suddenly felt nauseous, weak, diaphoretic, and had some chest pain. EMS was called and they gave her aspirin and sublingual nitroglycerin. They obtained an EKG which showed inferior ST elevation which resolved when she arrived at the hospital. She did not report any orthopnea or PND. She has no prior cardiac history. She does smoke 1 pack a day 42 years. She does not have any other medical issues that she is aware of. She is on heparin gtt, nitro gtt, and aggrastat gtt. Patient's history is limited due to the patient going in and out of sleep. No current chest pain. We were consulted for possible surgical revascularization.    Current Activity/ Functional Status: Patient was independent with mobility/ambulation, transfers, ADL's, IADL's.   Zubrod Score: At the time of surgery this patient's most appropriate activity status/level should be described as: []     0    Normal activity, no symptoms [x]     1    Restricted in physical strenuous activity but ambulatory, able to do out light work []     2    Ambulatory and capable of self care, unable to do work activities, up and about                 more than 50%  Of the time                            []     3    Only limited self care, in bed greater than 50% of waking hours []     4    Completely disabled, no self care, confined to bed or chair []     5    Moribund  Past Medical History:  Diagnosis Date  . Colon polyps     Past  Surgical History:  Procedure Laterality Date  . ABDOMINAL HYSTERECTOMY    . LEFT HEART CATH AND CORONARY ANGIOGRAPHY N/A 11/27/2016   Procedure: LEFT HEART CATH AND CORONARY ANGIOGRAPHY;  Surgeon: Leonie Man, MD;  Location: [facility];  Service: Cardiovascular;  Laterality: N/A;    History  Smoking Status  . Current Every Day Smoker  . Packs/day: 1.00  . Years: 42.00  . Types: Cigarettes  Smokeless Tobacco  . Never Used    History  Alcohol Use No    Social History   Social History  . Marital status: Unknown    Spouse name: N/A  . Number of children: N/A  . Years of education: N/A   Occupational History  . Not on file.   Social History Main Topics  . Smoking status: Current Every Day Smoker    Packs/day: 1.00    Years: 42.00    Types: Cigarettes  . Smokeless tobacco: Never Used  . Alcohol use No  . Drug use:  No  . Sexual activity: Not on file   Other Topics Concern  . Not on file   Social History Narrative  . No narrative on file    No Known Allergies  Current Facility-Administered Medications  Medication Dose Route Frequency Provider Last Rate Last Dose  . 0.9 %  sodium chloride infusion   Intravenous Continuous Little, Wenda Overland, MD 10 mL/hr at 11/28/16 0700    . 0.9 %  sodium chloride infusion  250 mL Intravenous PRN Leonie Man, MD      . acetaminophen (TYLENOL) tablet 650 mg  650 mg Oral Q4H PRN Leonie Man, MD      . aspirin chewable tablet 81 mg  81 mg Oral Daily Leonie Man, MD   81 mg at 11/28/16 9678  . atorvastatin (LIPITOR) tablet 80 mg  80 mg Oral q1800 Leonie Man, MD      . carvedilol (COREG) tablet 12.5 mg  12.5 mg Oral BID WC Martinique, Peter M, MD   12.5 mg at 11/28/16 9381  . heparin ADULT infusion 100 units/mL (25000 units/268mL sodium chloride 0.45%)  1,200 Units/hr Intravenous Continuous Karren Cobble, RPH 12 mL/hr at 11/28/16 1000 1,200 Units/hr at 11/28/16 1000  . hydrALAZINE (APRESOLINE)  injection 10 mg  10 mg Intravenous Q6H PRN Martinique, Peter M, MD   10 mg at 11/28/16 1250  . losartan (COZAAR) tablet 50 mg  50 mg Oral Daily Martinique, Peter M, MD   50 mg at 11/28/16 1250  . nitroGLYCERIN 50 mg in dextrose 5 % 250 mL (0.2 mg/mL) infusion  0-200 mcg/min Intravenous Titrated Leonie Man, MD 22.5 mL/hr at 11/28/16 1400 75 mcg/min at 11/28/16 1400  . ondansetron (ZOFRAN) injection 4 mg  4 mg Intravenous Q6H PRN Leonie Man, MD   4 mg at 11/28/16 1119  . sodium chloride flush (NS) 0.9 % injection 3 mL  3 mL Intravenous Q12H Leonie Man, MD   3 mL at 11/28/16 0958  . sodium chloride flush (NS) 0.9 % injection 3 mL  3 mL Intravenous PRN Leonie Man, MD      . tirofiban (AGGRASTAT) infusion 50 mcg/mL 100 mL  0.15 mcg/kg/min Intravenous Continuous Laren Everts, RPH 16.9 mL/hr at 11/28/16 1249 0.15 mcg/kg/min at 11/28/16 1249    Prescriptions Prior to Admission  Medication Sig Dispense Refill Last Dose  . aspirin EC 81 MG tablet Take 81 mg by mouth daily as needed for moderate pain.   Past Month at Unknown time  . ibuprofen (ADVIL,MOTRIN) 200 MG tablet Take 200 mg by mouth every 6 (six) hours as needed for headache or moderate pain.   11/27/2016 at Unknown time    Family History  Problem Relation Age of Onset  . Heart disease Brother      Review of Systems:  Pertinent items are noted in HPI.     Cardiac Review of Systems: Y or N  Chest Pain [  Y  ]  Resting SOB [ N  ] Exertional SOB  [ N ]  Orthopnea Aqua.Slicker  ]   Pedal Edema [ N  ]    Palpitations [  ] Syncope  [ N ]   Presyncope [   ]  General Review of Systems: [Y] = yes [  ]=no Constitional: recent weight change [  ]; anorexia [  ]; fatigue [ Y ]; nausea [ Y ]; night sweats [  ]; fever Aqua.Slicker  ]; or chills [  ]  Dental: poor dentition[  ]; Last Dentist visit:   Eye : blurred vision [  ]; diplopia [   ]; vision changes [  ];  Amaurosis fugax[  ]; Resp:  cough [ N ];  wheezing[ N ];  hemoptysis[  ]; shortness of breath[ N ]; paroxysmal nocturnal dyspnea[  ]; dyspnea on exertion[  ]; or orthopnea[  ];  GI:  gallstones[  ], vomiting[ Y ];  dysphagia[ N ]; melena[  ];  hematochezia [  ]; heartburn[ N ];   Hx of  Colonoscopy[  ]; GU: kidney stones [  ]; hematuria[ N ];   dysuria [  ];  nocturia[  ];  history of     obstruction [N  ]; urinary frequency [  ]             Skin: rash, swelling[  ];, hair loss[  ];  peripheral edema[ N ];  or itching[  ]; Musculosketetal: myalgias[  ];  joint swelling[  ];  joint erythema[  ];  joint pain[  ];  back pain[  ];  Heme/Lymph: bruising[  ];  bleeding[  ];  anemia[  ];  Neuro: TIA[  ];  headaches[  ];  stroke[ N ];  vertigo[  ];  seizures[ N ];   paresthesias[  ];  difficulty walking[  ];  Psych:depression[N  ]; anxiety[ N ];  Endocrine: diabetes[ N ];  thyroid dysfunction[N  ];  Immunizations: Flu [  ]; Pneumococcal[  ];  Other:  Physical Exam: BP (!) 151/74   Pulse 90   Temp 98.8 F (37.1 C) (Oral)   Resp (!) 21   Ht 5\' 7"  (1.702 m)   Wt 93.7 kg (206 lb 9.1 oz)   SpO2 97%   BMI 32.35 kg/m    General appearance: cooperative and no distress Resp: clear to auscultation bilaterally Cardio: regular rate and rhythm, S1, S2 normal, no murmur, click, rub or gallop GI: soft, non-tender; bowel sounds normal; no masses,  no organomegaly Extremities: extremities normal, atraumatic, no cyanosis or edema Neurologic: Grossly normal, responsive but sleepy  Diagnostic Studies & Laboratory data: Troponin: <0.03, 0.11, 1.00 Hemoglobin A1C 6.00  Echocardiography  Patient:    Breslyn, Abdo MR #:       [id number] Study Date: 11/28/2016 Gender:     F Age:        54 Height:     170.2 cm Weight:     93.7 kg BSA:        2.14 m^2 Pt. Status: Room:       2H12C   ADMITTING    Glenetta Hew, MD  ORDERING     Glenetta Hew, MD  REFERRING    Glenetta Hew, MD  SONOGRAPHER  Johny Chess, RDCS, CCT   PERFORMING   [facility], Inpatient  ATTENDING    Little, Wenda Overland  cc:  ------------------------------------------------------------------- LV EF: 60% -   65%  ------------------------------------------------------------------- Indications:      CAD of native vessels 414.01.  ------------------------------------------------------------------- History:   Risk factors:  Hypertension. Dyslipidemia.  ------------------------------------------------------------------- Study Conclusions  - Left ventricle: The cavity size was normal. Systolic function was   normal. The estimated ejection fraction was in the range of 60%   to 65%. Wall motion was normal; there were no regional wall   motion abnormalities. Doppler parameters are consistent with both   elevated ventricular end-diastolic filling pressure and elevated   left atrial filling pressure. - Aortic valve: There was mild regurgitation. -  Mitral valve: There was mild regurgitation. - Right ventricle: The cavity size was mildly dilated. Wall   thickness was normal. - Right atrium: The atrium was mildly dilated. - Atrial septum: No defect or patent foramen ovale was identified.      Recent Radiology Findings:   Dg Chest Port 1 View  Result Date: 11/27/2016 CLINICAL DATA:  STEMI; chest pain, N/V EXAM: PORTABLE CHEST 1 VIEW COMPARISON:  None. FINDINGS: Cardiomegaly. Lungs are clear. No pleural effusion or pneumothorax seen. Osseous structures about the chest are unremarkable. IMPRESSION: 1. Cardiomegaly. 2. Lungs are clear.  No evidence of CHF. Electronically Signed   By: Franki Cabot M.D.   On: 11/27/2016 21:29     I have independently reviewed the above radiologic studies.  Recent Lab Findings: Lab Results  Component Value Date   WBC 16.2 (H) 11/28/2016   HGB 12.1 11/28/2016   HCT 37.5 11/28/2016   PLT 297 11/28/2016   GLUCOSE 143 (H) 11/28/2016   CHOL 173 11/27/2016   TRIG 100 11/27/2016   HDL 46 11/27/2016    LDLCALC 107 (H) 11/27/2016   ALT 12 (L) 11/27/2016   AST 23 11/27/2016   NA 138 11/28/2016   K 3.5 11/28/2016   CL 106 11/28/2016   CREATININE 0.66 11/28/2016   BUN 11 11/28/2016   CO2 26 11/28/2016   INR 1.02 11/27/2016   HGBA1C 6.0 (H) 11/27/2016      Assessment / Plan:   The procedure was explained in great detail. All questions were answered to the patient's satisfaction. Continue medical therapy per Cardiololgy. Possible CABG with Dr. Roxan Hockey when there is OR availability.      I  spent 30 minutes counseling the patient face to face and 50% or more the  time was spent in counseling and coordination of care. The total time spent in the appointment was 60 minutes.   Nicholes Rough, PA-C 11/28/2016 2:25 PM   Patient seen and examined. 66 yo woman with a history of hypertension, not currently on meds. Presented last night with STEMI, resolved before getting to cath lab. Has 3 vessel CAD with complicated RCA. EF normal by cath and echo. Mild MR by echo. Currently pain free and has been since admission.   CABG indicated for survival benefit and relief of symptoms.   I discussed the general nature of the procedure, the need for general anesthesia, the use of cardiopulmonary bypass, and the incisions to be used with Mrs. Luzader. We discussed the expected hospital stay, overall recovery and short and long term outcomes. I informed her of the indications, risks, benefits and alternatives. She understands the risks include, but are not limited to death, stroke, MI, DVT/PE, bleeding, possible need for transfusion, infections, cardiac arrhythmias, as well as other organ system dysfunction including respiratory, renal, or GI complications.   She accepts the risks and agrees to proceed.  Plan CABG on Monday 9/24

## 2016-12-01 NOTE — Anesthesia Procedure Notes (Signed)
Arterial Line Insertion Start/End9/24/2018 11:12 AM, 12/01/2016 11:19 AM Performed by: Annye Asa, anesthesiologist  Patient location: Pre-op. Preanesthetic checklist: patient identified, IV checked, risks and benefits discussed, surgical consent, monitors and equipment checked, pre-op evaluation, timeout performed and anesthesia consent Lidocaine 1% used for infiltration Left, radial was placed Catheter size: 20 G Hand hygiene performed , maximum sterile barriers used  and Seldinger technique used Allen's test indicative of satisfactory collateral circulation Attempts: 1 (after CRNA attempt) Procedure performed without using ultrasound guided technique. Following insertion, dressing applied and Biopatch. Patient tolerated the procedure well with no immediate complications. Additional procedure comments: Arterial line: Timeout, sterile prep, drape, L wrist. 1% lido local by CRNA, f#20ga AC L radial artery. Biopatch and sterile dressing on.  Patient tolerated well.  VSS.  Jenita Seashore, MD.

## 2016-12-01 NOTE — Op Note (Signed)
[name], [name]             ACCOUNT NO.:  [id number]  MEDICAL RECORD NO.:  [id number]  LOCATION:  [facility]                        FACILITY:  [facility]  PHYSICIAN:  [name]. Roxan Hockey, M.D.DATE OF BIRTH:  1950-06-07  DATE OF PROCEDURE:  12/01/2016 DATE OF DISCHARGE:                              OPERATIVE REPORT   PREOPERATIVE DIAGNOSIS:  Severe 3-vessel coronary artery disease, status post myocardial infarction.  POSTOPERATIVE DIAGNOSIS:  Severe 3-vessel coronary artery disease, status post myocardial infarction.  PROCEDURES:  Median sternotomy, extracorporeal circulation, Coronary artery bypass grafting x 4  Left internal mammary artery to left anterior descending,  Saphenous vein graft to 1st diagonal,  Saphenous vein graft to obtuse marginal 2,  Saphenous vein graft to posterior descending  Endoscopic vein harvest of right leg.  SURGEON:  [name]. Roxan Hockey, M.D.  ASSISTANTEllwood Handler, PA.  ANESTHESIA:  General.  FINDINGS:  Transesophageal echocardiography showed preserved left ventricular function, mild AI and mild MR.  Post-bypass transesophageal echocardiography unchanged.  Good quality targets, good quality conduits, insufficient length of vein for diagonal graft to reach the aorta, therefore taken as a Y-graft off the OM vein.  CLINICAL NOTE:  [name] is a 66 year old woman, who presented with an ST-elevation MI, but had resolution of ST changes and pain prior to going to the cath lab emergently.  In the catheterization laboratory, she was found to have 3-vessel coronary disease with severe involvement of the right coronary artery.  She was referred for coronary artery bypass grafting.  The indications, risks, benefits, and alternatives were discussed in detail with the patient.  She understood and accepted the risks and agreed to proceed.  OPERATIVE NOTE:  [name] was brought to the preoperative holding area on December 01, 2016.   Anesthesia placed a Swan-Ganz catheter and arterial blood pressure monitoring line.  She was taken to the operating room, anesthetized, and intubated.  A Foley catheter was placed. Intravenous antibiotics were administered.  Transesophageal echocardiography was performed.  Findings as noted above.  The chest, abdomen, and legs were prepped and draped in usual sterile fashion.  A median sternotomy was performed, and the left internal mammary artery was harvested in a standard fashion.  Of note, there was sternal osteoporosis.  2000 units of heparin was administered during the vessel harvest.  Simultaneously, with the mammary harvest, an incision was made in the medial aspect of the right leg at the level of the knee.  The greater saphenous vein was harvested from the groin to the mid calf.  It was good quality above the knee, but below the knee the vein became very small and not suitable for use as a bypass graft.  There was sufficient vein to do the needed bypasses.  The remainder of the full heparin dose was given.  The sternal retractor was placed.  The pericardium was opened.  The ascending aorta was inspected.  There was no evidence of atherosclerotic disease.  After confirming adequate anticoagulation with ACT measurement, the aorta was cannulated via concentric 2-0 Ethibond pledgeted pursestring sutures.  A dual-stage venous cannula was placed via pursestring suture in the right atrial appendage.  Cardiopulmonary bypass was initiated.  Flows were maintained per  protocol.  The patient was cooled to 32 degrees Celsius. The coronary arteries were inspected and anastomotic sites were chosen. The conduits were inspected and cut to length.  A foam pad was placed in the pericardium to insulate the heart.  A temperature probe was placed in the myocardial septum.  A cardioplegia cannula was placed in the ascending aorta.  The aorta was crossclamped.  The left ventricle was emptied via  the aortic root vent.  Cardiac arrest then was achieved with a combination of cold antegrade blood cardioplegia and topical iced saline.  After achieving a complete diastolic arrest and adequate septal cooling with 1.5 L of cardioplegia, the following distal anastomoses were performed.  A reversed saphenous vein graft was placed end-to-side to the posterior descending branch of the right coronary artery.  This was a 1.5-mm, good quality target.  The vein was of good quality.  The end-to-side anastomosis was performed with a running 7-0 Prolene suture.  All anastomoses were probed proximally and distally at their completion to ensure patency.  Cardioplegia was administered down each vein graft at its completion to assess flow and hemostasis, both were good.  A reversed saphenous vein graft then was placed end-to-side to OM-2. This vessel was difficult to access.  It was high in the lateral wall at the site of the anastomosis just before the bifurcation.  It was a 1.5- mm, good quality target.  The anastomosis was performed with a running 7- 0 Prolene suture.  The vein was good quality as well.  The probe passed distally into both branches beyond the bifurcation.  There was good flow through the graft and good hemostasis with cardioplegia administration.  Additional cardioplegia was administered down the aortic root.  Next, a reversed saphenous vein graft was placed end-to-side to the first diagonal branch of the LAD.  This vessel was heavily diseased proximally, but good quality distally.  It did accept a 1.5-mm probe. The vein was anastomosed end-to-side with a running 7-0 Prolene suture. Both flow and hemostasis were good.  The left internal mammary artery then was brought through a window in the pericardium.  The distal end was beveled and it was anastomosed end- to-side to the distal LAD.  Both the mammary and LAD were 1.5-mm, good quality vessels.  The anastomosis was performed  with a running 8-0 Prolene suture.  At the completion of the anastomosis, the bulldog clamp was removed.  Septal rewarming was noted.  The bulldog clamp was replaced.  The mammary pedicle was tacked to the epicardial surface of the heart with 6-0 Prolene sutures.  Additional cardioplegia was administered.  The vein grafts were cut to length.  The proximal anastomoses for the OM and posterior descending veins were performed to 4.5 mm punch aortotomies with running 6-0 Prolene sutures.  At the completion of the second proximal anastomosis, the patient was placed in Trendelenburg position.  Lidocaine was administered.  The bulldog clamp was again removed from the left mammary artery.  The aortic root was de-aired and the aortic crossclamp was removed.  The total crossclamp time was 63 minutes.  The patient did require 3 defibrillations with the first two with 10 joules and the third with 20 joules before resuming sinus rhythm.  Bulldog clamps were placed proximally and distally on the OM vein and a longitudinal venotomy was made.  The proximal end of the diagonal graft was beveled and anastomosed end-to-side to the OM vein with a running 7-0 Prolene suture.  The graft was  de-aired from the distal end first before removing the proximal clamp.  While rewarming was completed, all proximal and distal anastomoses were inspected for hemostasis.  Epicardial pacing wires were placed on the right ventricle and right atrium.  When the patient had rewarmed to a core temperature of 37 degrees Celsius, she was weaned from cardiopulmonary bypass on the first attempt.  She was in sinus rhythm and did not require inotropic support.  Post-bypass transesophageal echocardiography showed mild AI and MR and preserved left ventricular wall motion.  The initial cardiac index was less than 2 L/min/m2, but the patient responded well to volume administration and remained hemodynamically stable thereafter.  A test  dose of protamine was administered and it was well tolerated. The atrial and aortic cannulae were removed.  The remainder of the protamine was administered without incident.  The chest was irrigated with warm saline.  Hemostasis was achieved.  The pericardium was reapproximated over the ascending aorta and base of the heart with interrupted 3-0 silk sutures.  Left pleural and mediastinal chest tubes were placed through separate subcostal incisions.  The sternum was closed with a combination of single and double heavy gauge stainless steel wires.  The pectoralis fascia, subcutaneous tissue, and skin were closed in a standard fashion.  All sponge, needle, and instrument counts were correct at the end of the procedure.  The patient was taken from the operating room to the Surgical Intensive Care Unit intubated and in good condition.     [name] Roxan Hockey, M.D.     SCH/MEDQ  D:  12/01/2016  T:  12/01/2016  Job:  [id number]

## 2016-12-01 NOTE — Progress Notes (Signed)
Progress Note  Patient Name: [name] Date of Encounter: 12/01/2016  Primary Cardiologist: Ellyn Hack  Subjective   Denies chest discomfort. Awaiting CABG this a.m. She denies dyspnea.  Inpatient Medications    Scheduled Meds: . fentaNYL      . midazolam      . [MAR Hold] amLODipine  2.5 mg Oral [name]  . [MAR Hold] aspirin  81 mg Oral [name]  . [MAR Hold] atorvastatin  80 mg Oral q1800  . [MAR Hold] carvedilol  25 mg Oral BID WC  . chlorhexidine  15 mL Mouth/Throat To SS-Surg  . diazepam  2 mg Oral To SS-Surg  . heparin-papaverine-plasmalyte irrigation   Irrigation To OR  . [MAR Hold] losartan  100 mg Oral [name]  . magnesium sulfate  40 mEq Other To OR  . metoprolol tartrate  12.5 mg Oral To SS-Surg  . potassium chloride  80 mEq Other To OR  . [MAR Hold] potassium chloride  40 mEq Oral [name]  . [MAR Hold] sodium chloride flush  3 mL Intravenous Q12H  . tranexamic acid  15 mg/kg Intravenous To OR  . tranexamic acid  2 mg/kg Intracatheter To OR   Continuous Infusions: . sodium chloride Stopped (11/28/16 0703)  . [MAR Hold] sodium chloride    . [MAR Hold] cefTRIAXone (ROCEPHIN)  IV Stopped (12/01/16 1010)  . cefUROXime (ZINACEF)  IV    . cefUROXime (ZINACEF)  IV    . dexmedetomidine    . DOPamine    . epinephrine    . heparin 30,000 units/NS 1000 mL solution for CELLSAVER    . insulin (NOVOLIN-R) infusion    . [MAR Hold] nitroGLYCERIN 140 mcg/min (12/01/16 0800)  . phenylephrine 20mg /267mL NS (0.08mg /ml) infusion    . tranexamic acid (CYKLOKAPRON) infusion ([facility])    . vancomycin     PRN Meds: [MAR Hold] sodium chloride, [MAR Hold] acetaminophen, [MAR Hold] hydrALAZINE, [MAR Hold] ondansetron (ZOFRAN) IV, [MAR Hold] promethazine, [MAR Hold] sodium chloride flush   Vital Signs    Vitals:   12/01/16 0730 12/01/16 0800 12/01/16 0900 12/01/16 1000  BP: 133/79 126/79 (!) 145/94 (!) 139/118  Pulse: 70 87 77 87  Resp: 14 17 15  (!) 24  Temp:      TempSrc:        SpO2: 98% 98% 98% 97%  Weight:      Height:        Intake/Output Summary (Last 24 hours) at 12/01/16 1038 Last data filed at 12/01/16 1000  Gross per 24 hour  Intake           1873.1 ml  Output              700 ml  Net           1173.1 ml   Filed Weights   11/27/16 2112 11/27/16 2315  Weight: 184 lb (83.5 kg) 206 lb 9.1 oz (93.7 kg)    Telemetry    Normal sinus rhythm without ectopy. - Personally Reviewed  ECG    Sinus rhythm, right bundle branch block, leftward QRS axis, left atrial abnormality, nonspecific junctional ST depression in the lateral precordial leads. - Personally Reviewed  Physical Exam  African-American female in no distress lying comfortably and flat in her bed. GEN: No acute distress.   Neck: No JVD Cardiac: RRR, no murmurs, rubs. An S4 gallop is audible.  Respiratory: Clear to auscultation bilaterally. GI: Soft, nontender, non-distended  MS: No edema; No deformity. Neuro:  Nonfocal  Psych: Normal affect   Labs    Chemistry Recent Labs Lab 11/27/16 2115  11/29/16 0431 11/30/16 0442 12/01/16 0239  NA 135  < > 137 138 138  K 3.3*  < > 3.2* 3.3* 3.2*  CL 105  < > 104 104 103  CO2 24  < > 26 27 26   GLUCOSE 175*  < > 138* 124* 116*  BUN 12  < > 11 11 10   CREATININE 0.73  < > 0.63 0.68 0.70  CALCIUM 8.8*  < > 9.0 8.8* 9.0  PROT 7.1  --   --   --   --   ALBUMIN 3.5  --   --   --   --   AST 23  --   --   --   --   ALT 12*  --   --   --   --   ALKPHOS 98  --   --   --   --   BILITOT 0.2*  --   --   --   --   GFRNONAA >60  < > >60 >60 >60  GFRAA >60  < > >60 >60 >60  ANIONGAP 6  < > 7 7 9   < > = values in this interval not displayed.   Hematology Recent Labs Lab 11/29/16 0431 11/30/16 0442 12/01/16 0239  WBC 16.8* 15.8* 16.7*  RBC 4.15 4.22 4.32  HGB 11.5* 11.6* 11.8*  HCT 34.8* 35.2* 36.3  MCV 83.9 83.4 84.0  MCH 27.7 27.5 27.3  MCHC 33.0 33.0 32.5  RDW 14.7 14.5 14.4  PLT 286 264 252    Cardiac Enzymes Recent Labs Lab  11/27/16 2115 11/27/16 2331 11/28/16 1241  TROPONINI <0.03 0.11* 1.00*   No results for input(s): TROPIPOC in the last 168 hours.   BNPNo results for input(s): BNP, PROBNP in the last 168 hours.   DDimer No results for input(s): DDIMER in the last 168 hours.   Radiology    No results found.  Cardiac Studies   Cardiac catheterization 11/27/2016: Coronary Diagrams   Diagnostic Diagram        Left ventriculography: The left ventricular systolic function is normal. The left ventricular ejection fraction is 55-65% by visual estimate. LV end diastolic pressure is severely elevated. 32-35 mmhg. There is moderate (3+) mitral regurgitation.   Patient Profile     66 y.o. female with hypertension admitted with inferior STEMI.  She was found to have severe 3 vessel CAD with acute severe diastolic heart failure.   Assessment & Plan    1. Multivessel coronary artery disease involving the LAD, and right coronary in multiple areas. Has been seen by cardiac surgery and felt to be a surgical candidate. 2. Hyperlipidemia with target LDL less than 70 3. Hypokalemia needs repletion 4. Hypertension will need aggressive management post surgery. 5. Possible undiagnosed diabetes mellitus type 2. Will check a hemoglobin A1c.  Plan CABG today. Clinical follow-up including aggressive risk factor modification will be planned for the future.  For questions or updates, please contact [facility] Please consult [email or web address] for contact info under Cardiology/STEMI.      Signed, Sinclair Grooms, MD  12/01/2016, 10:38 AM

## 2016-12-01 NOTE — Anesthesia Procedure Notes (Signed)
Central Venous Catheter Insertion Performed by: Annye Asa, anesthesiologist Start/End9/24/2018 10:57 AM, 12/01/2016 11:09 AM Patient location: Pre-op. Preanesthetic checklist: patient identified, IV checked, site marked, risks and benefits discussed, monitors and equipment checked, pre-op evaluation, timeout performed and anesthesia consent Position: Trendelenburg Lidocaine 1% used for infiltration and patient sedated Hand hygiene performed , maximum sterile barriers used  and Seldinger technique used Catheter size: 8.5 Fr PA cath was placed.Sheath introducer Swan type:thermodilution Procedure performed using ultrasound guided technique. Ultrasound Notes:anatomy identified, needle tip was noted to be adjacent to the nerve/plexus identified, no ultrasound evidence of intravascular and/or intraneural injection and image(s) printed for medical record Attempts: 1 Following insertion, line sutured, dressing applied and Biopatch. Post procedure assessment: blood return through all ports, free fluid flow and no air  Patient tolerated the procedure well with no immediate complications. Additional procedure comments: PA catheter:  Routine monitors. Timeout, sterile prep, drape, FBP R neck.  Trendelenburg position.  1% Lido local, finder and trocar RIJ 1st pass with US guidance.  Cordis placed over J wire. PA catheter in easily.  Sterile dressing applied.  Patient tolerated well, VSS.  Jenita Seashore, MD.

## 2016-12-01 NOTE — OR Nursing (Signed)
16:50 - 45 minute call to SICU charge nurse

## 2016-12-01 NOTE — Progress Notes (Signed)
Paged MD Servando Snare regarding patients CI 1.2-1.6. PAP 45-50/28-38. BP 115/77 MAP 88. HR AAI @ 90. Patient currently on no vasoactive drips. Patient UOP 20-30 cc/hr. Orders received to start dopamine at 51mcg/kr/hr. Will continue to monitor patient.  Levon Hedger, RN

## 2016-12-01 NOTE — Anesthesia Preprocedure Evaluation (Addendum)
Anesthesia Evaluation  Patient identified by MRN, date of birth, ID band Patient awake    Reviewed: Allergy & Precautions, NPO status , Patient's Chart, lab work & pertinent test results  History of Anesthesia Complications Negative for: history of anesthetic complications  Airway Mallampati: II  TM Distance: >3 FB Neck ROM: Full    Dental  (+) Edentulous Upper, Missing, Poor Dentition, Dental Advisory Given, Chipped   Pulmonary COPD, Current Smoker,    breath sounds clear to auscultation       Cardiovascular (-) angina+ CAD and + Past MI (acute STEMI of inferior wall)   Rhythm:Regular Rate:Normal  11/28/16 ECHO: EF 60-65%, mild MR, mild AI   Neuro/Psych negative neurological ROS     GI/Hepatic negative GI ROS, Neg liver ROS,   Endo/Other  Morbid obesity  Renal/GU negative Renal ROS     Musculoskeletal   Abdominal (+) + obese,   Peds  Hematology Hb 11.8, plt 252k   Anesthesia Other Findings   Reproductive/Obstetrics                            Anesthesia Physical Anesthesia Plan  ASA: III  Anesthesia Plan: General   Post-op Pain Management:    Induction: Intravenous  PONV Risk Score and Plan: Treatment may vary due to age or medical condition  Airway Management Planned: Oral ETT  Additional Equipment: Arterial line, PA Cath, TEE and Ultrasound Guidance Line Placement  Intra-op Plan:   Post-operative Plan: Post-operative intubation/ventilation  Informed Consent: I have reviewed the patients History and Physical, chart, labs and discussed the procedure including the risks, benefits and alternatives for the proposed anesthesia with the patient or authorized representative who has indicated his/her understanding and acceptance.   Dental advisory given  Plan Discussed with: CRNA and Surgeon  Anesthesia Plan Comments: (Plan routine monitors, A line, PA catheter, GETA with TEE and  post op ventilation)       Anesthesia Quick Evaluation

## 2016-12-01 NOTE — Progress Notes (Signed)
Patient ID: [name], female   DOB: 07/13/1950, 66 y.o.   MRN: [id number] EVENING ROUNDS NOTE :     [name]       [place] [address]             [phone]                 Day of Surgery Procedure(s) (LRB): CORONARY ARTERY BYPASS GRAFTING (CABG) x four , using left internal mammary artery and right leg greater saphenous vein harvested endoscopically - LIMA to LAD, -SVG to DIAGONAL, SVG to OM2, SVG to PDA  (N/A) INTRAOPERATIVE TRANSESOPHAGEAL ECHOCARDIOGRAM (N/A)  Total Length of Stay:  LOS: 4 days  BP (!) 139/118   Pulse 87   Temp 98.7 F (37.1 C) (Oral)   Resp (!) 24   Ht 5\' 7"  (1.702 m)   Wt 206 lb 9.1 oz (93.7 kg)   SpO2 97%   BMI 32.35 kg/m   .Intake/Output      09/23 0701 - 09/24 0700 09/24 0701 - 09/25 0700   P.O. 960 0   I.V. (mL/kg) 1024.6 (10.9) 3142.5 (33.5)   Blood  300   IV Piggyback 50 50   Total Intake(mL/kg) 2034.6 (21.7) 3492.5 (37.3)   Urine (mL/kg/hr) 150 (0.1) 1400 (1.3)   Blood  600   Total Output 150 2000   Net +1884.6 +1492.5        Urine Occurrence 6 x    Stool Occurrence 1 x      . sodium chloride    . [START ON 12/02/2016] sodium chloride    . sodium chloride 10 mL/hr at 12/01/16 1800  . albumin human    . cefTRIAXone (ROCEPHIN)  IV Stopped (12/01/16 1010)  . cefUROXime (ZINACEF)  IV    . dexmedetomidine (PRECEDEX) IV infusion 0.7 mcg/kg/hr (12/01/16 1800)  . famotidine (PEPCID) IV    . insulin (NOVOLIN-R) infusion 1.6 Units/hr (12/01/16 1800)  . lactated ringers    . lactated ringers    . lactated ringers 20 mL/hr at 12/01/16 1800  . magnesium sulfate 4 g (12/01/16 1818)  . nitroGLYCERIN Stopped (12/01/16 1800)  . phenylephrine (NEO-SYNEPHRINE) Adult infusion 10 mcg/min (12/01/16 1800)  . potassium chloride    . [START ON 12/02/2016] vancomycin       Lab Results  Component Value Date   WBC 16.7 (H) 12/01/2016   HGB 8.2 (L) 12/01/2016   HCT 24.0 (L) 12/01/2016   PLT 165 12/01/2016   GLUCOSE 127  (H) 12/01/2016   CHOL 173 11/27/2016   TRIG 100 11/27/2016   HDL 46 11/27/2016   LDLCALC 107 (H) 11/27/2016   ALT 12 (L) 11/27/2016   AST 23 11/27/2016   NA 140 12/01/2016   K 4.3 12/01/2016   CL 104 12/01/2016   CREATININE 0.50 12/01/2016   BUN 10 12/01/2016   CO2 26 12/01/2016   INR 1.02 11/27/2016   HGBA1C 6.0 (H) 11/27/2016   Early postop  bp stable Not bleeding    Grace Isaac MD  Beeper [phone] Office [phone] 12/01/2016 6:30 PM

## 2016-12-02 ENCOUNTER — Inpatient Hospital Stay (HOSPITAL_COMMUNITY): Payer: Medicaid Other

## 2016-12-02 ENCOUNTER — Encounter (HOSPITAL_COMMUNITY): Payer: Self-pay | Admitting: Thoracic Surgery (Cardiothoracic Vascular Surgery)

## 2016-12-02 LAB — GLUCOSE, CAPILLARY
GLUCOSE-CAPILLARY: 141 mg/dL — AB (ref 65–99)
GLUCOSE-CAPILLARY: 113 mg/dL — AB (ref 65–99)
GLUCOSE-CAPILLARY: 134 mg/dL — AB (ref 65–99)
Glucose-Capillary: 123 mg/dL — ABNORMAL HIGH (ref 65–99)
Glucose-Capillary: 146 mg/dL — ABNORMAL HIGH (ref 65–99)
Glucose-Capillary: 98 mg/dL (ref 65–99)

## 2016-12-02 LAB — BASIC METABOLIC PANEL
ANION GAP: 4 — AB (ref 5–15)
BUN: 12 mg/dL (ref 6–20)
CHLORIDE: 106 mmol/L (ref 101–111)
CO2: 26 mmol/L (ref 22–32)
Calcium: 7.8 mg/dL — ABNORMAL LOW (ref 8.9–10.3)
Creatinine, Ser: 0.73 mg/dL (ref 0.44–1.00)
GFR calc Af Amer: >60 mL/min (ref >60)
GLUCOSE: 139 mg/dL — AB (ref 65–99)
POTASSIUM: 4.1 mmol/L (ref 3.5–5.1)
Sodium: 136 mmol/L (ref 135–145)

## 2016-12-02 LAB — CBC
HCT: 31.4 % — ABNORMAL LOW (ref 36.0–46.0)
HEMATOCRIT: 30.4 % — AB (ref 36.0–46.0)
HEMATOCRIT: 30.3 % — AB (ref 36.0–46.0)
HEMOGLOBIN: 9.8 g/dL — AB (ref 12.0–15.0)
HEMOGLOBIN: 9.9 g/dL — AB (ref 12.0–15.0)
Hemoglobin: 9.9 g/dL — ABNORMAL LOW (ref 12.0–15.0)
MCH: 26.9 pg (ref 26.0–34.0)
MCH: 27.3 pg (ref 26.0–34.0)
MCH: 27.7 pg (ref 26.0–34.0)
MCHC: 31.5 g/dL (ref 30.0–36.0)
MCHC: 32.2 g/dL (ref 30.0–36.0)
MCHC: 32.7 g/dL (ref 30.0–36.0)
MCV: 85.3 fL (ref 78.0–100.0)
MCV: 84.7 fL (ref 78.0–100.0)
MCV: 84.9 fL (ref 78.0–100.0)
PLATELETS: 204 10*3/uL (ref 150–400)
Platelets: 197 10*3/uL (ref 150–400)
Platelets: 200 10*3/uL (ref 150–400)
RBC: 3.68 MIL/uL — ABNORMAL LOW (ref 3.87–5.11)
RBC: 3.59 MIL/uL — AB (ref 3.87–5.11)
RBC: 3.57 MIL/uL — AB (ref 3.87–5.11)
RDW: 14.5 % (ref 11.5–15.5)
RDW: 14.7 % (ref 11.5–15.5)
RDW: 14.9 % (ref 11.5–15.5)
WBC: 19.4 10*3/uL — ABNORMAL HIGH (ref 4.0–10.5)
WBC: 21.9 10*3/uL — AB (ref 4.0–10.5)
WBC: 21.9 10*3/uL — AB (ref 4.0–10.5)

## 2016-12-02 LAB — POCT I-STAT 3, ART BLOOD GAS (G3+)
Acid-base deficit: 1 mmol/L (ref 0.0–2.0)
Bicarbonate: 25.1 mmol/L (ref 20.0–28.0)
O2 Saturation: 97 %
TCO2: 26 mmol/L (ref 22–32)
pCO2 arterial: 47.5 mmHg (ref 32.0–48.0)
pH, Arterial: 7.331 — ABNORMAL LOW (ref 7.350–7.450)
pO2, Arterial: 97 mmHg (ref 83.0–108.0)

## 2016-12-02 LAB — CREATININE, SERUM
Creatinine, Ser: 0.76 mg/dL (ref 0.44–1.00)
Creatinine, Ser: 1.07 mg/dL — ABNORMAL HIGH (ref 0.44–1.00)
GFR calc non Af Amer: >60 mL/min (ref >60)
GFR, EST NON AFRICAN AMERICAN: 54 mL/min — AB (ref >60)

## 2016-12-02 LAB — HEMOGLOBIN A1C
Hgb A1c MFr Bld: 6 % — ABNORMAL HIGH (ref 4.8–5.6)
Mean Plasma Glucose: 125.5 mg/dL

## 2016-12-02 LAB — POCT I-STAT, CHEM 8
BUN: 20 mg/dL (ref 6–20)
CALCIUM ION: 1.22 mmol/L (ref 1.15–1.40)
Chloride: 100 mmol/L — ABNORMAL LOW (ref 101–111)
Creatinine, Ser: 1 mg/dL (ref 0.44–1.00)
GLUCOSE: 128 mg/dL — AB (ref 65–99)
HCT: 29 % — ABNORMAL LOW (ref 36.0–46.0)
Hemoglobin: 9.9 g/dL — ABNORMAL LOW (ref 12.0–15.0)
Potassium: 4.1 mmol/L (ref 3.5–5.1)
SODIUM: 139 mmol/L (ref 135–145)
TCO2: 27 mmol/L (ref 22–32)

## 2016-12-02 LAB — MAGNESIUM
MAGNESIUM: 2.5 mg/dL — AB (ref 1.7–2.4)
MAGNESIUM: 3 mg/dL — AB (ref 1.7–2.4)
Magnesium: 2.4 mg/dL (ref 1.7–2.4)

## 2016-12-02 MED ORDER — LABETALOL HCL 5 MG/ML IV SOLN
10.0000 mg | Freq: Once | INTRAVENOUS | Status: AC | PRN
  Administered 2016-12-02: 10 mg via INTRAVENOUS
  Filled 2016-12-02: qty 4

## 2016-12-02 MED ORDER — ALBUTEROL SULFATE (2.5 MG/3ML) 0.083% IN NEBU
2.5000 mg | INHALATION_SOLUTION | Freq: Four times a day (QID) | RESPIRATORY_TRACT | Status: DC | PRN
  Administered 2016-12-02 – 2016-12-03 (×2): 2.5 mg via RESPIRATORY_TRACT
  Filled 2016-12-02 (×2): qty 3

## 2016-12-02 MED ORDER — METOPROLOL TARTRATE 25 MG/10 ML ORAL SUSPENSION
25.0000 mg | Freq: Two times a day (BID) | ORAL | Status: DC
  Administered 2016-12-03: 25 mg
  Filled 2016-12-02: qty 10

## 2016-12-02 MED ORDER — FUROSEMIDE 10 MG/ML IJ SOLN
40.0000 mg | Freq: Once | INTRAMUSCULAR | Status: AC
  Administered 2016-12-02: 40 mg via INTRAVENOUS
  Filled 2016-12-02: qty 4

## 2016-12-02 MED ORDER — METOPROLOL TARTRATE 25 MG PO TABS
25.0000 mg | ORAL_TABLET | Freq: Two times a day (BID) | ORAL | Status: DC
  Administered 2016-12-02 – 2016-12-04 (×4): 25 mg via ORAL
  Filled 2016-12-02 (×5): qty 1

## 2016-12-02 MED ORDER — LABETALOL HCL 5 MG/ML IV SOLN
10.0000 mg | INTRAVENOUS | Status: DC | PRN
Start: 2016-12-02 — End: 2016-12-08
  Administered 2016-12-03 – 2016-12-08 (×2): 10 mg via INTRAVENOUS
  Filled 2016-12-02 (×2): qty 4

## 2016-12-02 MED ORDER — POTASSIUM CHLORIDE 10 MEQ/50ML IV SOLN
10.0000 meq | INTRAVENOUS | Status: AC
  Administered 2016-12-02 (×2): 10 meq via INTRAVENOUS

## 2016-12-02 MED ORDER — FUROSEMIDE 10 MG/ML IJ SOLN
20.0000 mg | Freq: Once | INTRAMUSCULAR | Status: AC
  Administered 2016-12-02: 20 mg via INTRAVENOUS
  Filled 2016-12-02: qty 2

## 2016-12-02 MED ORDER — INSULIN ASPART 100 UNIT/ML ~~LOC~~ SOLN
0.0000 [IU] | SUBCUTANEOUS | Status: DC
  Administered 2016-12-02 – 2016-12-03 (×4): 2 [IU] via SUBCUTANEOUS

## 2016-12-02 MED ORDER — LOSARTAN POTASSIUM 50 MG PO TABS
50.0000 mg | ORAL_TABLET | Freq: Every day | ORAL | Status: DC
  Administered 2016-12-02 – 2016-12-07 (×6): 50 mg via ORAL
  Filled 2016-12-02 (×6): qty 1

## 2016-12-02 MED ORDER — ENOXAPARIN SODIUM 40 MG/0.4ML ~~LOC~~ SOLN
40.0000 mg | Freq: Every day | SUBCUTANEOUS | Status: DC
  Administered 2016-12-02 – 2016-12-07 (×6): 40 mg via SUBCUTANEOUS
  Filled 2016-12-02 (×6): qty 0.4

## 2016-12-02 NOTE — Progress Notes (Signed)
MD Servando Snare paged regarding patients BP reading 140-150s/70-80s. MAPs 90-105. Patient is on 135mcg of Nitro and PRN meds have been given. Orders received to turn dopamine down to 1.81mcg/kg/ml. Will continue to monitor.  Levon Hedger, RN

## 2016-12-02 NOTE — Anesthesia Postprocedure Evaluation (Signed)
Anesthesia Post Note  Patient: [name]  Procedure(s) Performed: Procedure(s) (LRB): CORONARY ARTERY BYPASS GRAFTING (CABG) x four , using left internal mammary artery and right leg greater saphenous vein harvested endoscopically - LIMA to LAD, -SVG to DIAGONAL, SVG to OM2, SVG to PDA  (N/A) INTRAOPERATIVE TRANSESOPHAGEAL ECHOCARDIOGRAM (N/A)     Patient location during evaluation: SICU Anesthesia Type: General Level of consciousness: awake and alert, patient cooperative and oriented (very conversant) Pain management: pain level controlled Vital Signs Assessment: post-procedure vital signs reviewed and stable Respiratory status: spontaneous breathing, nonlabored ventilation, respiratory function stable and patient connected to nasal cannula oxygen Cardiovascular status: blood pressure returned to baseline and stable Postop Assessment: no apparent nausea or vomiting and adequate PO intake Anesthetic complications: no    Last Vitals:  Vitals:   12/02/16 1900 12/02/16 2000  BP: 133/90 126/79  Pulse: 90 90  Resp: (!) 26 16  Temp:  37.3 C  SpO2: 97% 98%    Last Pain:  Vitals:   12/02/16 2000  TempSrc: Oral  PainSc:                  [name],[name]

## 2016-12-02 NOTE — Progress Notes (Signed)
1 Day Post-Op Procedure(s) (LRB): CORONARY ARTERY BYPASS GRAFTING (CABG) x four , using left internal mammary artery and right leg greater saphenous vein harvested endoscopically - LIMA to LAD, -SVG to DIAGONAL, SVG to OM2, SVG to PDA  (N/A) INTRAOPERATIVE TRANSESOPHAGEAL ECHOCARDIOGRAM (N/A) Subjective: "I had a rough night, but feel better this morning"  Objective: Vital signs in last 24 hours: Temp:  [97.7 F (36.5 C)-99.7 F (37.6 C)] 98.1 F (36.7 C) (09/25 0730) Pulse Rate:  [43-100] 88 (09/25 0730) Cardiac Rhythm: (P) Normal sinus rhythm (09/25 0752) Resp:  [12-30] 20 (09/25 0730) BP: (96-170)/(69-157) 118/83 (09/25 0700) SpO2:  [93 %-100 %] 99 % (09/25 0730) Arterial Line BP: (102-156)/(64-86) 120/70 (09/25 0730) FiO2 (%):  [40 %-50 %] 40 % (09/24 2225) Weight:  [209 lb 11.2 oz (95.1 kg)] 209 lb 11.2 oz (95.1 kg) (09/25 0434)  Hemodynamic parameters for last 24 hours: PAP: (27-65)/(13-35) 35/19 CO:  [2.6 L/min-5.4 L/min] 4.1 L/min CI:  [1.3 L/min/m2-2.6 L/min/m2] 2 L/min/m2  Intake/Output from previous day: 09/24 0701 - 09/25 0700 In: 5210.8 [I.V.:4180.8; Blood:300; NG/GT:30; IV Piggyback:700] Out: 3605 [Urine:2745; Blood:600; Chest Tube:260] Intake/Output this shift: No intake/output data recorded.  General appearance: alert, cooperative and no distress Neurologic: intact Heart: regular rate and rhythm Lungs: rhonchi bilaterally Abdomen: normal findings: soft, non-tender  Lab Results:  Recent Labs  12/01/16 2358 12/01/16 2359 12/02/16 0418  WBC 19.4*  --  21.9*  HGB 9.9* 9.2* 9.8*  HCT 30.3* 27.0* 30.4*  PLT 197  --  200   BMET:  Recent Labs  12/01/16 0239  12/01/16 2359 12/02/16 0418  NA 138  < > 141 136  K 3.2*  < > 4.4 4.1  CL 103  < > 106 106  CO2 26  --   --  26  GLUCOSE 116*  < > 122* 139*  BUN 10  < > 14 12  CREATININE 0.70  < > 0.70 0.73  CALCIUM 9.0  --   --  7.8*  < > = values in this interval not displayed.  PT/INR:  Recent Labs  12/01/16 1810  LABPROT 17.7*  INR 1.47   ABG    Component Value Date/Time   PHART 7.331 (L) 12/02/2016 0059   HCO3 25.1 12/02/2016 0059   TCO2 26 12/02/2016 0059   ACIDBASEDEF 1.0 12/02/2016 0059   O2SAT 97.0 12/02/2016 0059   CBG (last 3)   Recent Labs  12/01/16 2212 12/01/16 2310 12/02/16 0407  GLUCAP 111* 98 141*    Assessment/Plan: S/P Procedure(s) (LRB): CORONARY ARTERY BYPASS GRAFTING (CABG) x four , using left internal mammary artery and right leg greater saphenous vein harvested endoscopically - LIMA to LAD, -SVG to DIAGONAL, SVG to OM2, SVG to PDA  (N/A) INTRAOPERATIVE TRANSESOPHAGEAL ECHOCARDIOGRAM (N/A) -  CV- stable in SR, still on relatively high dose of nitroglycerin  Increase lopressor, add losartan, continue ASA and atorvastatin  DC swan and A line  RESP- IS for atelectasis, nebs PRN  RENAL- creatinine and lytes OK, diurese  ENDO_ CBG well controlled, Q4 CBG/ SSI  DVT prophylaxis- SCD + enoxaparin  Dc chest tubes  Cardiac rehab   LOS: 5 days    [name] 12/02/2016

## 2016-12-02 NOTE — Progress Notes (Signed)
   NSR, RBBB, LAA, and old IMI.  Neurologically intact and conversant. Doing well.

## 2016-12-02 NOTE — Progress Notes (Signed)
Paged MD Servando Snare regarding patients MAP ranging 90-100s. Patient maxed out on nitro gtt, given PRN BP meds, and pain is well under control. Orders received to wean dopamine off. Also received another PRN med for a one time dose if needed. Will continue to monitor patient.  Levon Hedger, RN

## 2016-12-02 NOTE — Progress Notes (Signed)
Patient examined and record reviewed.Hemodynamics stable,labs satisfactory.Patient had stable day.Continue current care. [name] 12/02/2016

## 2016-12-03 ENCOUNTER — Inpatient Hospital Stay (HOSPITAL_COMMUNITY): Payer: Medicaid Other

## 2016-12-03 LAB — CBC
HEMATOCRIT: 31.5 % — AB (ref 36.0–46.0)
Hemoglobin: 10.1 g/dL — ABNORMAL LOW (ref 12.0–15.0)
MCH: 27.4 pg (ref 26.0–34.0)
MCHC: 32.1 g/dL (ref 30.0–36.0)
MCV: 85.4 fL (ref 78.0–100.0)
PLATELETS: 208 10*3/uL (ref 150–400)
RBC: 3.69 MIL/uL — AB (ref 3.87–5.11)
RDW: 14.8 % (ref 11.5–15.5)
WBC: 22.6 10*3/uL — AB (ref 4.0–10.5)

## 2016-12-03 LAB — BASIC METABOLIC PANEL
ANION GAP: 7 (ref 5–15)
BUN: 18 mg/dL (ref 6–20)
CO2: 28 mmol/L (ref 22–32)
CREATININE: 0.85 mg/dL (ref 0.44–1.00)
Calcium: 8.6 mg/dL — ABNORMAL LOW (ref 8.9–10.3)
Chloride: 102 mmol/L (ref 101–111)
Glucose, Bld: 95 mg/dL (ref 65–99)
Potassium: 3.3 mmol/L — ABNORMAL LOW (ref 3.5–5.1)
SODIUM: 137 mmol/L (ref 135–145)

## 2016-12-03 LAB — GLUCOSE, CAPILLARY
GLUCOSE-CAPILLARY: 125 mg/dL — AB (ref 65–99)
GLUCOSE-CAPILLARY: 151 mg/dL — AB (ref 65–99)
GLUCOSE-CAPILLARY: 125 mg/dL — AB (ref 65–99)
Glucose-Capillary: 98 mg/dL (ref 65–99)
Glucose-Capillary: 135 mg/dL — ABNORMAL HIGH (ref 65–99)
Glucose-Capillary: 113 mg/dL — ABNORMAL HIGH (ref 65–99)

## 2016-12-03 MED ORDER — FUROSEMIDE 40 MG PO TABS
40.0000 mg | ORAL_TABLET | Freq: Every day | ORAL | Status: DC
  Administered 2016-12-03: 40 mg via ORAL
  Filled 2016-12-03: qty 1

## 2016-12-03 MED ORDER — POTASSIUM CHLORIDE 10 MEQ/50ML IV SOLN
10.0000 meq | INTRAVENOUS | Status: AC
  Administered 2016-12-03 (×3): 10 meq via INTRAVENOUS
  Filled 2016-12-03 (×3): qty 50

## 2016-12-03 MED ORDER — SODIUM CHLORIDE 0.9% FLUSH: 3.0000 mL | INTRAVENOUS | Status: DC | PRN

## 2016-12-03 MED ORDER — LEVOFLOXACIN 500 MG PO TABS
500.0000 mg | ORAL_TABLET | Freq: Every day | ORAL | Status: AC
  Administered 2016-12-03 – 2016-12-07 (×5): 500 mg via ORAL
  Filled 2016-12-03 (×5): qty 1

## 2016-12-03 MED ORDER — MOVING RIGHT ALONG BOOK
Freq: Once | Status: DC
  Filled 2016-12-03: qty 1

## 2016-12-03 MED ORDER — INSULIN ASPART 100 UNIT/ML ~~LOC~~ SOLN
0.0000 [IU] | Freq: Three times a day (TID) | SUBCUTANEOUS | Status: DC
  Administered 2016-12-03: 3 [IU] via SUBCUTANEOUS
  Administered 2016-12-03 (×2): 2 [IU] via SUBCUTANEOUS

## 2016-12-03 MED ORDER — SODIUM CHLORIDE 0.9% FLUSH
3.0000 mL | Freq: Two times a day (BID) | INTRAVENOUS | Status: DC
  Administered 2016-12-03 – 2016-12-08 (×9): 3 mL via INTRAVENOUS

## 2016-12-03 MED ORDER — POTASSIUM CHLORIDE 10 MEQ/50ML IV SOLN
10.0000 meq | INTRAVENOUS | Status: AC
  Administered 2016-12-03 (×2): 10 meq via INTRAVENOUS
  Filled 2016-12-03 (×2): qty 50

## 2016-12-03 MED ORDER — ALUM & MAG HYDROXIDE-SIMETH 200-200-20 MG/5ML PO SUSP
15.0000 mL | Freq: Four times a day (QID) | ORAL | Status: DC | PRN
  Filled 2016-12-03: qty 30

## 2016-12-03 MED ORDER — POTASSIUM CHLORIDE CRYS ER 20 MEQ PO TBCR
20.0000 meq | EXTENDED_RELEASE_TABLET | Freq: Two times a day (BID) | ORAL | Status: DC
  Administered 2016-12-03 – 2016-12-08 (×11): 20 meq via ORAL
  Filled 2016-12-03 (×11): qty 1

## 2016-12-03 MED ORDER — SODIUM CHLORIDE 0.9 % IV SOLN: 250.0000 mL | INTRAVENOUS | Status: DC | PRN

## 2016-12-03 MED ORDER — ZOLPIDEM TARTRATE 5 MG PO TABS: 5.0000 mg | ORAL_TABLET | Freq: Every evening | ORAL | Status: DC | PRN

## 2016-12-03 MED ORDER — MAGNESIUM HYDROXIDE 400 MG/5ML PO SUSP: 30.0000 mL | Freq: Every day | ORAL | Status: DC | PRN

## 2016-12-03 MED FILL — Magnesium Sulfate Inj 50%: INTRAMUSCULAR | Qty: 10 | Status: AC

## 2016-12-03 MED FILL — Potassium Chloride Inj 2 mEq/ML: INTRAVENOUS | Qty: 40 | Status: AC

## 2016-12-03 MED FILL — Sodium Chloride IV Soln 0.9%: INTRAVENOUS | Qty: 2000 | Status: AC

## 2016-12-03 MED FILL — Mannitol IV Soln 20%: INTRAVENOUS | Qty: 500 | Status: AC

## 2016-12-03 MED FILL — Heparin Sodium (Porcine) Inj 1000 Unit/ML: INTRAMUSCULAR | Qty: 30 | Status: AC

## 2016-12-03 MED FILL — Heparin Sodium (Porcine) Inj 1000 Unit/ML: INTRAMUSCULAR | Qty: 10 | Status: AC

## 2016-12-03 MED FILL — Sodium Bicarbonate IV Soln 8.4%: INTRAVENOUS | Qty: 50 | Status: AC

## 2016-12-03 MED FILL — Electrolyte-R (PH 7.4) Solution: INTRAVENOUS | Qty: 3000 | Status: AC

## 2016-12-03 MED FILL — Lidocaine HCl IV Inj 20 MG/ML: INTRAVENOUS | Qty: 5 | Status: AC

## 2016-12-03 NOTE — Progress Notes (Signed)
CARDIOLOGY   Patient is awake and alert. Difficulty sleeping last p.m. Has had a nap already this morning and feels better.  No arrhythmias.  Overall making great progress.

## 2016-12-03 NOTE — Progress Notes (Signed)
2 Days Post-Op Procedure(s) (LRB): CORONARY ARTERY BYPASS GRAFTING (CABG) x four , using left internal mammary artery and right leg greater saphenous vein harvested endoscopically - LIMA to LAD, -SVG to DIAGONAL, SVG to OM2, SVG to PDA  (N/A) INTRAOPERATIVE TRANSESOPHAGEAL ECHOCARDIOGRAM (N/A) Subjective: C/o flank pain, didn't sleep last night  Objective: Vital signs in last 24 hours: Temp:  [97.9 F (36.6 C)-99.4 F (37.4 C)] 98.3 F (36.8 C) (09/26 0743) Pulse Rate:  [80-98] 94 (09/26 0500) Cardiac Rhythm: Normal sinus rhythm (09/26 0600) Resp:  [15-28] 21 (09/26 0600) BP: (110-146)/(73-102) 134/91 (09/26 0600) SpO2:  [94 %-99 %] 94 % (09/26 0600) Arterial Line BP: (118-143)/(69-81) 143/81 (09/25 1015) Weight:  [209 lb (94.8 kg)] 209 lb (94.8 kg) (09/26 0500)  Hemodynamic parameters for last 24 hours: PAP: (35-54)/(15-33) 54/21  Intake/Output from previous day: 09/25 0701 - 09/26 0700 In: 1791.9 [P.O.:930; I.V.:561.9; IV Piggyback:300] Out: 1320 [Urine:1280; Chest Tube:40] Intake/Output this shift: No intake/output data recorded.  General appearance: alert, cooperative and no distress Neurologic: intact Heart: regular rate and rhythm Lungs: diminished breath sounds bibasilar and wheezes faint bilaterally Abdomen: normal findings: soft, non-tender  Lab Results:  Recent Labs  12/02/16 1529 12/02/16 1536 12/03/16 0336  WBC 21.9*  --  22.6*  HGB 9.9* 9.9* 10.1*  HCT 31.4* 29.0* 31.5*  PLT 204  --  208   BMET:  Recent Labs  12/02/16 0418  12/02/16 1536 12/03/16 0336  NA 136  --  139 137  K 4.1  --  4.1 3.3*  CL 106  --  100* 102  CO2 26  --   --  28  GLUCOSE 139*  --  128* 95  BUN 12  --  20 18  CREATININE 0.73  < > 1.00 0.85  CALCIUM 7.8*  --   --  8.6*  < > = values in this interval not displayed.  PT/INR:  Recent Labs  12/01/16 1810  LABPROT 17.7*  INR 1.47   ABG    Component Value Date/Time   PHART 7.331 (L) 12/02/2016 0059   HCO3 25.1  12/02/2016 0059   TCO2 27 12/02/2016 1536   ACIDBASEDEF 1.0 12/02/2016 0059   O2SAT 97.0 12/02/2016 0059   CBG (last 3)   Recent Labs  12/02/16 2025 12/02/16 2334 12/03/16 0320  GLUCAP 146* 123* 98    Assessment/Plan: S/P Procedure(s) (LRB): CORONARY ARTERY BYPASS GRAFTING (CABG) x four , using left internal mammary artery and right leg greater saphenous vein harvested endoscopically - LIMA to LAD, -SVG to DIAGONAL, SVG to OM2, SVG to PDA  (N/A) INTRAOPERATIVE TRANSESOPHAGEAL ECHOCARDIOGRAM (N/A) Plan for transfer to step-down: see transfer orders  POD # 2  CV- in SR, BP better  RESP- CXR improved- continue IS + flutter  Albuterol PRN  RENAL- creatinine OK  Hypokalemia- supplement  Preop UTI- was on ceftriaxone, will complete course with PO levaquin  Dc Foley  ENDO- CBG well controlled- change to [facility]  SCD + enoxaparin for DVT prophylaxis  Cardiac rehab   LOS: 6 days    [name] 12/03/2016

## 2016-12-03 NOTE — Progress Notes (Signed)
      [address]       [place] [address]             [phone]      BP (!) 142/90   Pulse 86   Temp 98.5 F (36.9 C) (Oral)   Resp (!) 26   Ht 5\' 7"  (1.702 m)   Wt 209 lb (94.8 kg)   SpO2 98%   BMI 32.73 kg/m    Intake/Output Summary (Last 24 hours) at 12/03/16 1729 Last data filed at 12/03/16 1200  Gross per 24 hour  Intake             1490 ml  Output              985 ml  Net              505 ml   Awaiting bed on 4E  No new issues  Remo Lipps C. Roxan Hockey, MD [facility] [phone]

## 2016-12-04 ENCOUNTER — Inpatient Hospital Stay (HOSPITAL_COMMUNITY): Payer: Medicaid Other

## 2016-12-04 DIAGNOSIS — Z951 Presence of aortocoronary bypass graft: Secondary | ICD-10-CM

## 2016-12-04 LAB — GLUCOSE, CAPILLARY
GLUCOSE-CAPILLARY: 126 mg/dL — AB (ref 65–99)
Glucose-Capillary: 108 mg/dL — ABNORMAL HIGH (ref 65–99)

## 2016-12-04 LAB — CBC
HCT: 30.8 % — ABNORMAL LOW (ref 36.0–46.0)
HEMOGLOBIN: 10 g/dL — AB (ref 12.0–15.0)
MCH: 28 pg (ref 26.0–34.0)
MCHC: 32.5 g/dL (ref 30.0–36.0)
MCV: 86.3 fL (ref 78.0–100.0)
Platelets: 211 10*3/uL (ref 150–400)
RBC: 3.57 MIL/uL — AB (ref 3.87–5.11)
RDW: 15.1 % (ref 11.5–15.5)
WBC: 18.5 10*3/uL — ABNORMAL HIGH (ref 4.0–10.5)

## 2016-12-04 LAB — BASIC METABOLIC PANEL
Anion gap: 9 (ref 5–15)
BUN: 16 mg/dL (ref 6–20)
CHLORIDE: 103 mmol/L (ref 101–111)
CO2: 25 mmol/L (ref 22–32)
CREATININE: 0.71 mg/dL (ref 0.44–1.00)
Calcium: 8.5 mg/dL — ABNORMAL LOW (ref 8.9–10.3)
GFR calc Af Amer: >60 mL/min (ref >60)
GFR calc non Af Amer: >60 mL/min (ref >60)
GLUCOSE: 98 mg/dL (ref 65–99)
POTASSIUM: 3.9 mmol/L (ref 3.5–5.1)
SODIUM: 137 mmol/L (ref 135–145)

## 2016-12-04 MED ORDER — FUROSEMIDE 10 MG/ML IJ SOLN
40.0000 mg | Freq: Once | INTRAMUSCULAR | Status: AC
  Administered 2016-12-04: 40 mg via INTRAVENOUS
  Filled 2016-12-04: qty 4

## 2016-12-04 MED ORDER — METOPROLOL TARTRATE 25 MG/10 ML ORAL SUSPENSION: 50.0000 mg | Freq: Two times a day (BID) | ORAL | Status: DC

## 2016-12-04 MED ORDER — GUAIFENESIN ER 600 MG PO TB12
1200.0000 mg | ORAL_TABLET | Freq: Two times a day (BID) | ORAL | Status: DC
  Administered 2016-12-04 – 2016-12-08 (×9): 1200 mg via ORAL
  Filled 2016-12-04 (×9): qty 2

## 2016-12-04 MED ORDER — LEVALBUTEROL HCL 0.63 MG/3ML IN NEBU
0.6300 mg | INHALATION_SOLUTION | Freq: Three times a day (TID) | RESPIRATORY_TRACT | Status: DC
  Administered 2016-12-04 – 2016-12-06 (×9): 0.63 mg via RESPIRATORY_TRACT
  Filled 2016-12-04 (×12): qty 3

## 2016-12-04 MED ORDER — FUROSEMIDE 40 MG PO TABS: 40.0000 mg | ORAL_TABLET | Freq: Every day | ORAL | Status: DC

## 2016-12-04 MED ORDER — METOPROLOL TARTRATE 25 MG PO TABS
25.0000 mg | ORAL_TABLET | Freq: Once | ORAL | Status: AC
  Administered 2016-12-04: 25 mg via ORAL

## 2016-12-04 MED ORDER — METOPROLOL TARTRATE 50 MG PO TABS
50.0000 mg | ORAL_TABLET | Freq: Two times a day (BID) | ORAL | Status: DC
  Administered 2016-12-04 – 2016-12-05 (×3): 50 mg via ORAL
  Filled 2016-12-04 (×3): qty 1

## 2016-12-04 NOTE — Progress Notes (Signed)
   Sinus tachycardia. Ambulating.   Uptitrate beta blocker therapy as tolerated.

## 2016-12-04 NOTE — Evaluation (Signed)
Occupational Therapy Evaluation Patient Details Name: [name] MRN: [id number] DOB: 1950-05-31 Today's Date: 12/04/2016    History of Present Illness [name] is a 66 year old female with a past medical history of essential hypertension and hyperlipidemia who was returning to her car after work when she suddenly felt nauseous, weak, diaphoretic, and had some chest pain. EMS was called and they gave her aspirin and sublingual nitroglycerin. They obtained an EKG which showed inferior ST elevation which resolved when she arrived at the hospital. Found to have severe 3 vessel CAD with acute severe diastolic heart failure. s/p CABGx4 12/01/16   Clinical Impression   Pt currently completes LB selfcare and toilet transfers with min assist using the RW for support.  She is able to state and follow sternal precautions for transitional movements as well.  Based on discharge situation pt does not have 24 hour supervision.  Recommend short term SNF for further rehab to reach modified independent level for home.  Will continue to follow while in acute care.     Follow Up Recommendations  SNF    Equipment Recommendations  None recommended by OT       Precautions / Restrictions Precautions Precautions: Sternal      Mobility Bed Mobility Overal bed mobility: Needs Assistance Bed Mobility: Supine to Sit     Supine to sit: Min assist        Transfers Overall transfer level: Needs assistance Equipment used: 1 person hand held assist Transfers: Sit to/from Stand Sit to Stand: Min assist              Balance Overall balance assessment: Needs assistance   Sitting balance-Leahy Scale: Good     Standing balance support: During functional activity Standing balance-Leahy Scale: Fair                             ADL either performed or assessed with clinical judgement   ADL Overall ADL's : Needs assistance/impaired Eating/Feeding: Independent   Grooming:  Wash/dry hands;Wash/dry face;Supervision/safety   Upper Body Bathing: Supervision/ safety   Lower Body Bathing: Minimal assistance;Sit to/from stand   Upper Body Dressing : Set up;Sitting   Lower Body Dressing: Minimal assistance;Sit to/from stand   Toilet Transfer: Minimal assistance;RW   Toileting- Clothing Manipulation and Hygiene: Minimal assistance;Sit to/from stand       Functional mobility during ADLs: Minimal assistance General ADL Comments: Pt needing only min assist for sit to stand during LB selfcare.  Min assist for transition of UB to sitting from supine with HOB elevated slightly.       Vision Baseline Vision/History: No visual deficits Patient Visual Report: No change from baseline Vision Assessment?: No apparent visual deficits            Pertinent Vitals/Pain Pain Assessment: Faces Faces Pain Scale: Hurts a little bit Pain Location: chest Pain Descriptors / Indicators: Discomfort Pain Intervention(s): Limited activity within patient's tolerance;Repositioned     Hand Dominance Right   Extremity/Trunk Assessment Upper Extremity Assessment Upper Extremity Assessment: Generalized weakness (Did not test UEs formally secondary to sternal precautions.  )   Lower Extremity Assessment Lower Extremity Assessment: Defer to PT evaluation   Cervical / Trunk Assessment Cervical / Trunk Assessment: Normal   Communication Communication Communication: No difficulties   Cognition Arousal/Alertness: Awake/alert Behavior During Therapy: WFL for tasks assessed/performed Overall Cognitive Status: Within Functional Limits for tasks assessed  Home Living Family/patient expects to be discharged to:: Private residence Living Arrangements: Children (She lives with her son who works during the day)   Type of Home: Mart Access: Stairs to enter Technical brewer of Steps: [place]: One level     Bathroom Shower/Tub: Teacher, early years/pre: Standard Bathroom Accessibility: Yes              Prior Functioning/Environment Level of Independence: Independent        Comments: working 3 days a week, Hydrographic surveyor,        OT Problem List: Decreased strength;Decreased activity tolerance;Impaired balance (sitting and/or standing);Pain      OT Treatment/Interventions: Self-care/ADL training;Patient/family education;Balance training;Therapeutic activities    OT Goals(Current goals can be found in the care plan section) Acute Rehab OT Goals Patient Stated Goal: To get back to work Time For Goal Achievement: 12/18/16 Potential to Achieve Goals: Good  OT Frequency: Min 2X/week   Barriers to D/C: Decreased caregiver support             AM-PAC PT "6 Clicks" Daily Activity     Outcome Measure Help from another person eating meals?: None Help from another person taking care of personal grooming?: A Little Help from another person toileting, which includes using toliet, bedpan, or urinal?: A Little Help from another person bathing (including washing, rinsing, drying)?: A Little Help from another person to put on and taking off regular upper body clothing?: A Little Help from another person to put on and taking off regular lower body clothing?: A Little 6 Click Score: 19   End of Session Equipment Utilized During Treatment: Rolling walker Nurse Communication: Mobility status  Activity Tolerance: Patient tolerated treatment well Patient left: in bed;with call bell/phone within reach  OT Visit Diagnosis: Unsteadiness on feet (R26.81)                Time: 1194-1740 OT Time Calculation (min): 19 min Charges:  OT General Charges $OT Visit: 1 Visit OT Evaluation $OT Eval Low Complexity: 1 Low    [name]  OTR/L 12/04/2016, 5:07 PM

## 2016-12-04 NOTE — Progress Notes (Addendum)
[address]       [place] [address]             [phone]      3 Days Post-Op Procedure(s) (LRB): CORONARY ARTERY BYPASS GRAFTING (CABG) x four , using left internal mammary artery and right leg greater saphenous vein harvested endoscopically - LIMA to LAD, -SVG to DIAGONAL, SVG to OM2, SVG to PDA  (N/A) INTRAOPERATIVE TRANSESOPHAGEAL ECHOCARDIOGRAM (N/A)   Subjective:  Ms. Limbert states she is doing okay.  She doesn't feel as good as yesterday.  She states she has a lot of mucous that she has been unable to cough up, stating its been worse since she got up here.  No BM, + flatus  Objective: Vital signs in last 24 hours: Temp:  [97.7 F (36.5 C)-98.7 F (37.1 C)] 98.7 F (37.1 C) (09/27 0635) Pulse Rate:  [86-122] 122 (09/27 0343) Cardiac Rhythm: Normal sinus rhythm (09/26 2238) Resp:  [17-26] 23 (09/27 0343) BP: (108-153)/(78-112) 108/80 (09/27 0635) SpO2:  [76 %-100 %] 76 % (09/27 0343) Weight:  [209 lb 14.4 oz (95.2 kg)] 209 lb 14.4 oz (95.2 kg) (09/27 0343)  Intake/Output from previous day: 09/26 0701 - 09/27 0700 In: 1020 [P.O.:960; I.V.:60] Out: 400 [Urine:400]  General appearance: alert, cooperative and no distress Heart: regular rate and rhythm and tachy Lungs: coarse throughout Abdomen: soft, non-tender; bowel sounds normal; no masses,  no organomegaly Extremities: edema trace Wound: clean and dry  Lab Results:  Recent Labs  12/03/16 0336 12/04/16 0503  WBC 22.6* 18.5*  HGB 10.1* 10.0*  HCT 31.5* 30.8*  PLT 208 211   BMET:  Recent Labs  12/03/16 0336 12/04/16 0503  NA 137 137  K 3.3* 3.9  CL 102 103  CO2 28 25  GLUCOSE 95 98  BUN 18 16  CREATININE 0.85 0.71  CALCIUM 8.6* 8.5*    PT/INR:  Recent Labs  12/01/16 1810  LABPROT 17.7*  INR 1.47   ABG    Component Value Date/Time   PHART 7.331 (L) 12/02/2016 0059   HCO3 25.1 12/02/2016 0059   TCO2 27 12/02/2016 1536   ACIDBASEDEF 1.0 12/02/2016 0059   O2SAT  97.0 12/02/2016 0059   CBG (last 3)   Recent Labs  12/03/16 1606 12/03/16 1943 12/03/16 2223  GLUCAP 135* 113* 125*    Assessment/Plan: S/P Procedure(s) (LRB): CORONARY ARTERY BYPASS GRAFTING (CABG) x four , using left internal mammary artery and right leg greater saphenous vein harvested endoscopically - LIMA to LAD, -SVG to DIAGONAL, SVG to OM2, SVG to PDA  (N/A) INTRAOPERATIVE TRANSESOPHAGEAL ECHOCARDIOGRAM (N/A)  1. CV- Sinus Tach BP control improved- continue Lopressor, Cozaar 2. Pulm- weaning oxygen as tolerated, coarse sounds, increased mucous production... Will change nebs to Xopenex, add Mucinex.. 2V CXR is pending 3. Renal- creatinine WNL, lytes okay.. Weight is 24 lbs above preop, not much edema on exam... Will give IV Lasix today, may help with respiratory issues 4. Expected post operative blood loss anemia- stable 5. ID- preop UTI, completed IV Rocephin, will continue Levaquin for 5 days, remains afebrile 6. CBGs- controlled, not a diabetic, will d/c SSIP 7. Deconditioning- PT/OT [place]utls, CSW for SNF placement 8. Dispo- patient stable, need to optimize respiratory status, diurese, continue current care   Addendum: CXR reviewed, evidence of CHF present, small left pleural effusion, will give IV Lasix BID today.   LOS: 7 days    [place] Handler 12/04/2016 Patient seen and examined, agree with  above Needs aggressive pulmonary toilet. Iv diuresis  Remo Lipps C. Roxan Hockey, MD [facility] [phone]

## 2016-12-04 NOTE — Progress Notes (Signed)
CARDIAC REHAB PHASE I   PRE:  Rate/Rhythm: 122-126 ST with PVCs    BP: sitting 149/90    SaO2: 93 RA  Pt just got to recliner from BSC 5 min ago. 120s ST sitting. Has rattling congestion. Had her practice IS (250 mL) and flutter. Encouraged walking later after she gets more meds to control HR. Her HR was 150s at sink with PT and very fatigued. Will f/u tomorrow. [place], ACSM 12/04/2016 2:43 PM

## 2016-12-04 NOTE — Discharge Summary (Signed)
Physician Discharge Summary  Patient ID: NYESHA CLIFF MRN: [id number] DOB/AGE: July 05, 1950 66 y.o.  Admit date: 11/27/2016 Discharge date: 12/08/2016  Admission Diagnoses:  Patient Active Problem List   Diagnosis Date Noted  . Acute myocardial infarction ([place])   . Acute ST elevation myocardial infarction (STEMI) of inferior wall ([place]) 11/27/2016  . Acute diastolic heart failure ([place]) 11/27/2016  . Coronary artery disease involving native coronary artery of native heart with unstable angina pectoris ([place]) 11/27/2016  . Essential hypertension 11/27/2016  . Hyperlipidemia with target low density lipoprotein (LDL) cholesterol less than 70 mg/dL 11/27/2016   Discharge Diagnoses:   Patient Active Problem List   Diagnosis Date Noted  . S/P CABG x 4 12/04/2016  . Acute myocardial infarction ([place])   . Acute ST elevation myocardial infarction (STEMI) of inferior wall ([place]) 11/27/2016  . Acute diastolic heart failure ([place]) 11/27/2016  . Coronary artery disease involving native coronary artery of native heart with unstable angina pectoris ([place]) 11/27/2016  . Essential hypertension 11/27/2016  . Hyperlipidemia with target low density lipoprotein (LDL) cholesterol less than 70 mg/dL 11/27/2016   Discharged Condition: good  History of Present Illness:  Mrs. Friesenhahn is a 66 yo AA female with PMH of HTN, Nicotine abuse (1 ppd x 63yrs) and Hyperlipidemia.  After work she was walking to her car when she developed complaints of nausea, weakness, diaphoresis, and chest pain.  EMS was contacted and on arrival gave patient ASA and SL NTG.  EKG was obtained which showed inferior ST elevation which resolved on arrival to the hospital.  She was admitted for further cardiac workup.  Hospital Course:   She was placed on Heparin, Nitroglycerin and aggrastat drips.  She had no chest pain during admission.  She was found to have a UTI and was placed on Rocephin.  She was taken for cardiac catheterization  which showed multivessel CAD with a preserved EF.  It was felt coronary bypass grating would be indicated and TCTS consult was obtained.  She was evaluated by Dr. Roxan Hockey who was in agreement the patient would benefit from coronary bypass grafting procedure.  The risks and benefits of the procedure were explained to the patient and she was agreeable to proceed.  She was taken to the operating room on 12/01/2016.  She underwent CABG x 4 utilizing LIMA to LAD, SVG to Diagonal, SVG to OM2, and SVG to PDA.  She also underwent endoscopic harvest of greater saphenous vein from her right leg.  She tolerated the procedure without difficulty and was taken to the SICU in stable condition.  The patient was extubated the evening of surgery.  During her stay in the SICU she required Dopamine drip to be started for low cardiac and urinary output.  This was weaned off as tolerated.  Her NTG was weaned off as her BP improved.  Her chest tubes and arterial lines were removed without difficulty.  She was hypokalemic and supplemented accordingly.  She complained of flank pain, she was treated with IV Rocephin preoperatively for UTI and she was started on 5 days of Levaquin to complete therapy.  She was volume overloaded and started on Lasix.  She was felt medically stable for transfer to the step down unit on POD #2.  The patient complained of worsening sputum production.  She states that she is struggling to get sputum up.  She was placed on scheduled Xopenex for this and started on Mucinex BID.  CXR showed evidence of early CHF.  She  remained volume overloaded she was given IV Lasix with good urinary output.  She is maintaining NSR and her pacing wires were removed without difficulty.  Repeat CXR showed improvement in appearance of CHF and atelectasis.  She is deconditioned and lives alone.  PT/OT consults were obtained and recommended SNF placement.  CSW was consulted and arrangements were unable to be made d/t not having any  insurance. Home health and equipment have been arranged. made.  The patient is ambulating with assistance.  Her pain is well controlled.  She is tolerating a diet.  She is felt medically stable for discharge home today.  Significant Diagnostic Studies: angiography:    Prox RCA-2 lesion, 60 %stenosed. Prox-mid RCA-1 lesion, 55 %stenosed.  Dist RCA-2 lesion, 95 %stenosed. Dist RCA-1 lesion, 90 %stenosed. - This combination is likely culprit lesion, however with very tortuous RCA in a stable patient did not feel it was a good idea to attempt PTCA at this time.  Ost LAD lesion, 65 %stenosed. Mid LAD lesion, 90 %stenosed.  Ost 1st Diag to 1st Diag lesion, 50 %stenosed. 1st Diag lesion, 60 %stenosed.  Dist LAD lesion, 50 %stenosed. - This is beyond the 2nd Diag branch.  2nd Mrg lesion, 60 %stenosed.  The left ventricular systolic function is normal. The left ventricular ejection fraction is 55-65% by visual estimate.  LV end diastolic pressure is severely elevated. 32-35 mmhg  There is moderate (3+) mitral regurgitation.  Treatments: surgery:    Median sternotomy, extracorporeal circulation, coronary artery bypass grafting x4 (left internal mammary artery to left anterior descending, saphenous vein graft to 1st diagonal, saphenous vein graft to obtuse marginal 2, saphenous vein graft to posterior descending), and endoscopic vein harvest of right leg.  Disposition: SNF  Discharge Medications:  The patient has been discharged on:   1.Beta Blocker:  Yes [ x  ]                              No   [   ]                              If No, reason:  2.Ace Inhibitor/ARB: Yes [ x  ]                                     No  [    ]                                     If No, reason:  3.Statin:   Yes [ x  ]                  No  [   ]                  If No, reason:  4.Ecasa:  Yes  [x   ]                  No   [   ]                  If No, reason:     Discharge Instructions    Amb  Referral to Cardiac Rehabilitation    Complete  by:  As directed    Diagnosis:   STEMI CABG     CABG X ___:  4     Allergies as of 12/08/2016   No Known Allergies     Medication List    STOP taking these medications   ibuprofen 200 MG tablet Commonly known as:  ADVIL,MOTRIN     TAKE these medications   aspirin 325 MG EC tablet Take 1 tablet (325 mg total) by mouth daily. What changed:  medication strength  how much to take  when to take this  reasons to take this   atorvastatin 80 MG tablet Commonly known as:  LIPITOR Take 1 tablet (80 mg total) by mouth daily at 6 PM.   furosemide 40 MG tablet Commonly known as:  LASIX Take 1 tablet (40 mg total) by mouth daily.   losartan 25 MG tablet Commonly known as:  COZAAR Take 3 tablets (75 mg total) by mouth daily.   Metoprolol Tartrate 75 MG Tabs Take 75 mg by mouth 2 (two) times daily.   oxyCODONE 5 MG immediate release tablet Commonly known as:  Oxy IR/ROXICODONE Take 1-2 tablets (5-10 mg total) by mouth every 6 (six) hours as needed for severe pain.   potassium chloride SA 20 MEQ tablet Commonly known as:  K-DUR,KLOR-CON Take 1 tablet (20 mEq total) by mouth daily.            Durable Medical Equipment        Start     Ordered   12/08/16 1453  For home use only DME 3 n 1  Once     12/08/16 1452   12/08/16 1452  For home use only DME Walker rolling  Once    Comments:  Post op CABG  Question:  Patient needs a walker to treat with the following condition  Answer:  Weakness   12/08/16 1452     Follow-up [facility] Follow up on 12/19/2016.   Why:  1 pm for hospital follow up Contact information: [place] [id number] [place]       Melrose Nakayama, MD Follow up on 01/06/2017.   Specialty:  Cardiothoracic Surgery Why:  Appointment is at 11:30, please get CXR at 11:00 at [facility] located on first  floor of office building Contact information: [place] [address] [phone]        Burtis Junes, NP Follow up on 12/29/2016.   Specialties:  Nurse Practitioner, Interventional Cardiology, Cardiology, Radiology Why:  Appointment is at 10:00 Contact information: [place] [place] [address] [phone]           Signed: John Giovanni 12/08/2016, 2:56 PM

## 2016-12-04 NOTE — Evaluation (Signed)
Physical Therapy Evaluation Patient Details Name: [name] MRN: [id number] DOB: Oct 29, 1950 Today's Date: 12/04/2016   History of Present Illness  [name] is a 66 year old female with a past medical history of essential hypertension and hyperlipidemia who was returning to her car after work when she suddenly felt nauseous, weak, diaphoretic, and had some chest pain. EMS was called and they gave her aspirin and sublingual nitroglycerin. They obtained an EKG which showed inferior ST elevation which resolved when she arrived at the hospital. Found to have severe 3 vessel CAD with acute severe diastolic heart failure. s/p CABGx4 12/01/16  Clinical Impression  Patient is s/p above surgery resulting in functional limitations due to the deficits listed below (see PT Problem List). Pt currently minA for bed mobility, and transfers and min guard for ambulation of 20 feet with RW. Pt limited in session by tachycardia and increase in respiration with limited gait. Patient will benefit from skilled PT to increase their independence and safety with mobility to allow discharge to the venue listed below.       Follow Up Recommendations SNF    Equipment Recommendations  Other (comment) (to be determined at next venue)    Recommendations for Other Services       Precautions / Restrictions Precautions Precautions: Sternal      Mobility  Bed Mobility Overal bed mobility: Needs Assistance Bed Mobility: Supine to Sit     Supine to sit: Min assist     General bed mobility comments: minA for trunk to upright and pad scoot to EoB to maintain sternal precautions  Transfers Overall transfer level: Needs assistance Equipment used: 1 person hand held assist Transfers: Sit to/from Stand Sit to Stand: Min assist         General transfer comment: minA for powerup maintaining sternal precautions, vc for scooting hips further forward before power up  Ambulation/Gait Ambulation/Gait  assistance: Min guard Ambulation Distance (Feet): 20 Feet Assistive device: Rolling walker (2 wheeled) Gait Pattern/deviations: Step-through pattern;Decreased step length - right;Decreased step length - left;Shuffle Gait velocity: slowed Gait velocity interpretation: Below normal speed for age/gender General Gait Details: min guard for safety with ambulation in room, strong steady gait however ambulation limited by rise in HR and RR      Balance Overall balance assessment: Needs assistance Sitting-balance support: Feet unsupported;No upper extremity supported Sitting balance-Leahy Scale: Good     Standing balance support: Single extremity supported;During functional activity Standing balance-Leahy Scale: Poor Standing balance comment: requires at single UE support for balance                             Pertinent Vitals/Pain Pain Assessment: No/denies pain    Home Living Family/patient expects to be discharged to:: Private residence Living Arrangements: Alone   Type of Home: Apartment Home Access: Stairs to enter   [employer] of Steps: 3 Home Layout: One level        Prior Function Level of Independence: Independent         Comments: working 3 days a week, community ambulator,     Journalist, newspaper   Dominant Hand: Right    Extremity/Trunk Assessment   Upper Extremity Assessment Upper Extremity Assessment: Generalized weakness    Lower Extremity Assessment Lower Extremity Assessment: Generalized weakness       Communication   Communication: No difficulties  Cognition Arousal/Alertness: Awake/alert Behavior During Therapy: WFL for tasks assessed/performed Overall Cognitive Status: Within Functional Limits  for tasks assessed                                        General Comments General comments (skin integrity, edema, etc.): SaO2 on 3 L via nasal cannula 98%O2, SaO2 on RA 93%O2, prior to ambulation BP 103/61, HR  116 bpm, RR 16, with ambulation to the sink on RA HR rose to max of 156 bpm of sinus rhythm and RR of 33bpm, SaO2 maintained at 96%O2 pt ambulated back to recliner BP 125/86, HR 118bpm, RR 14 and SaO2 on RA 96%O2, supplemental O2 left off and nursing notified of tachycardia and pt being left on RA          Assessment/Plan    PT Assessment Patient needs continued PT services  PT Problem List Decreased strength;Decreased activity tolerance;Decreased mobility;Cardiopulmonary status limiting activity       PT Treatment Interventions DME instruction;Gait training;Functional mobility training;Therapeutic activities;Therapeutic exercise;Balance training;Patient/family education    PT Goals (Current goals can be found in the Care Plan section)  Acute Rehab PT Goals Patient Stated Goal: go home as soon as possible PT Goal Formulation: With patient Time For Goal Achievement: 12/11/16 Potential to Achieve Goals: Good    Frequency Min 3X/week   Barriers to discharge Decreased caregiver support         AM-PAC PT "6 Clicks" Daily Activity  Outcome Measure Difficulty turning over in bed (including adjusting bedclothes, sheets and blankets)?: A Lot Difficulty moving from lying on back to sitting on the side of the bed? : Unable Difficulty sitting down on and standing up from a chair with arms (e.g., wheelchair, bedside commode, etc,.)?: Unable Help needed moving to and from a bed to chair (including a wheelchair)?: A Little Help needed walking in hospital room?: A Little Help needed climbing 3-5 steps with a railing? : A Lot 6 Click Score: 12    End of Session Equipment Utilized During Treatment: Gait belt Activity Tolerance: Treatment limited secondary to medical complications (Comment) (tachycardia) Patient left: in chair;with call bell/phone within reach Nurse Communication: Mobility status PT Visit Diagnosis: Other abnormalities of gait and mobility (R26.89);Muscle weakness  (generalized) (M62.81);Difficulty in walking, not elsewhere classified (R26.2)    Time: 1517-6160 PT Time Calculation (min) (ACUTE ONLY): 31 min   Charges:   PT Evaluation $PT Eval Moderate Complexity: 1 Mod PT Treatments $Therapeutic Activity: 8-22 mins   PT G Codes:        Neng Albee B. Migdalia Dk PT, DPT Acute Rehabilitation  [phone] Pager [phone]    [place] 12/04/2016, 12:14 PM

## 2016-12-05 LAB — CBC
HEMATOCRIT: 31.5 % — AB (ref 36.0–46.0)
Hemoglobin: 10 g/dL — ABNORMAL LOW (ref 12.0–15.0)
MCH: 27.2 pg (ref 26.0–34.0)
MCHC: 31.7 g/dL (ref 30.0–36.0)
MCV: 85.8 fL (ref 78.0–100.0)
PLATELETS: 258 10*3/uL (ref 150–400)
RBC: 3.67 MIL/uL — ABNORMAL LOW (ref 3.87–5.11)
RDW: 14.8 % (ref 11.5–15.5)
WBC: 18.7 10*3/uL — AB (ref 4.0–10.5)

## 2016-12-05 LAB — GLUCOSE, CAPILLARY
GLUCOSE-CAPILLARY: 114 mg/dL — AB (ref 65–99)
Glucose-Capillary: 97 mg/dL (ref 65–99)

## 2016-12-05 LAB — BASIC METABOLIC PANEL
Anion gap: 7 (ref 5–15)
BUN: 15 mg/dL (ref 6–20)
CO2: 31 mmol/L (ref 22–32)
CREATININE: 0.73 mg/dL (ref 0.44–1.00)
Calcium: 8.6 mg/dL — ABNORMAL LOW (ref 8.9–10.3)
Chloride: 101 mmol/L (ref 101–111)
GFR calc Af Amer: >60 mL/min (ref >60)
GLUCOSE: 128 mg/dL — AB (ref 65–99)
Potassium: 3.9 mmol/L (ref 3.5–5.1)
SODIUM: 139 mmol/L (ref 135–145)

## 2016-12-05 MED ORDER — FUROSEMIDE 10 MG/ML IJ SOLN
40.0000 mg | Freq: Once | INTRAMUSCULAR | Status: AC
  Administered 2016-12-05: 40 mg via INTRAVENOUS
  Filled 2016-12-05: qty 4

## 2016-12-05 NOTE — Progress Notes (Signed)
CARDIAC REHAB PHASE I   PRE:  Rate/Rhythm: 103 ST    BP: sitting 128/89    SaO2: 100 3L, 89-92 RA  MODE:  Ambulation: 300 ft   POST:  Rate/Rhythm: 122 ST    BP: sitting 144/94     SaO2: 98 2L  Pt much improved today. HR more controlled, less congested. She is on 3L in room, attempted RA sitting but SaO2 low. Used 2L to walk and SaO2 remained high. Slow pace with RW, rest x2 briefly. Return to recliner. Encouraged x2 more walks, IS, and flutter valve.  [place], ACSM 12/05/2016 10:55 AM

## 2016-12-05 NOTE — Progress Notes (Signed)
Removed wires without resistance at 1350. Wires intact and patient tolerated procedure well.Bedrest started for 1 hr, and vital set for q15 BP.

## 2016-12-05 NOTE — Progress Notes (Addendum)
      [address]       [place] [address]             [phone]      4 Days Post-Op Procedure(s) (LRB): CORONARY ARTERY BYPASS GRAFTING (CABG) x four , using left internal mammary artery and right leg greater saphenous vein harvested endoscopically - LIMA to LAD, -SVG to DIAGONAL, SVG to OM2, SVG to PDA  (N/A) INTRAOPERATIVE TRANSESOPHAGEAL ECHOCARDIOGRAM (N/A)   Subjective:  [name] is feeling better this morning.  She states it has been easier for her to expectorate mucous after using her incentive spirometer.  I encouraged her to continue rigorous use.  Objective: Vital signs in last 24 hours: Temp:  [98.4 F (36.9 C)-99.2 F (37.3 C)] 98.8 F (37.1 C) (09/28 0745) Pulse Rate:  [89-125] 99 (09/28 0745) Cardiac Rhythm: Sinus tachycardia (09/27 1900) Resp:  [19-25] 22 (09/28 0745) BP: (111-149)/(88-98) 132/88 (09/28 0745) SpO2:  [90 %-100 %] 91 % (09/28 0745) Weight:  [205 lb 11.2 oz (93.3 kg)] 205 lb 11.2 oz (93.3 kg) (09/28 0520)  Intake/Output from previous day: 09/27 0701 - 09/28 0700 In: 240 [P.O.:240] Out: 700 [Urine:700]  General appearance: alert, cooperative and no distress Heart: regular rate and rhythm Lungs: coarse throughout Abdomen: soft, non-tender; bowel sounds normal; no masses,  no organomegaly Extremities: edema 1+ Wound: clean and dry  Lab Results:  Recent Labs  12/04/16 0503 12/05/16 0319  WBC 18.5* 18.7*  HGB 10.0* 10.0*  HCT 30.8* 31.5*  PLT 211 258   BMET:  Recent Labs  12/04/16 0503 12/05/16 0319  NA 137 139  K 3.9 3.9  CL 103 101  CO2 25 31  GLUCOSE 98 128*  BUN 16 15  CREATININE 0.71 0.73  CALCIUM 8.5* 8.6*    PT/INR: No results for input(s): LABPROT, INR in the last 72 hours. ABG    Component Value Date/Time   PHART 7.331 (L) 12/02/2016 0059   HCO3 25.1 12/02/2016 0059   TCO2 27 12/02/2016 1536   ACIDBASEDEF 1.0 12/02/2016 0059   O2SAT 97.0 12/02/2016 0059   CBG (last 3)   Recent  Labs  12/03/16 2223 12/04/16 1114 12/04/16 1634  GLUCAP 125* 126* 108*    Assessment/Plan: S/P Procedure(s) (LRB): CORONARY ARTERY BYPASS GRAFTING (CABG) x four , using left internal mammary artery and right leg greater saphenous vein harvested endoscopically - LIMA to LAD, -SVG to DIAGONAL, SVG to OM2, SVG to PDA  (N/A) INTRAOPERATIVE TRANSESOPHAGEAL ECHOCARDIOGRAM (N/A)  1. CV- Sinus Tach, BP improved- continue Lopressor, Cozaar 2. Pulm- continue aggressive pulm toilet, continues to have coarse BS, overall she appears to be less congested than yesterday 3. Renal- creatinine stable, weight trended down 4 lbs, continue IV diuretics, supplement K 4. ID- continue Levaquin for preop UTI, low grade temp today, will monitor 5. Deconditioning- SNF placement 6. Dispo- will d/c EPW today, continue IV diuretics, continue aggressive pulm toilet, maybe for SNF Monday if can optimize respiratory status   LOS: 8 days    BARRETT, ERIN 12/05/2016 Patient seen and examined agree with above Still only getting 500 ml with incentive Needs to ambulate more, but overall looks better  Remo Lipps C. Roxan Hockey, MD [facility] [phone]

## 2016-12-05 NOTE — Clinical Social Work Note (Signed)
Clinical Social Work Assessment  Patient Details  Name: [name] MRN: [id number] Date of Birth: Aug 27, 1950  Date of referral:  12/05/16               Reason for consult:  Discharge Planning, Facility Placement                Permission sought to share information with:  Family Supports Permission granted to share information::  Yes, Verbal Permission Granted  Name::     [place]::     Relationship::  son  Contact Information:  [phone]  Housing/Transportation Living arrangements for the past 2 months:  Single Family Home Source of Information:  Patient Patient Interpreter Needed:  None Criminal Activity/Legal Involvement Pertinent to Current Situation/Hospitalization:  No - Comment as needed Significant Relationships:  Adult Children, Other Family Members Lives with:  Self Do you feel safe going back to the place where you live?  Yes Need for family participation in patient care:  No (Coment)  Care giving concerns:  Patients cousin is at bedside. Patient stated she lives alone but has family in the area unfortunately they all work    Facilities manager / plan: Holiday representative met patient at bedside to offer support and discuss discharge needs. Patient stated she is agreeable to discharge to SNF. CSW and patient discussed an LOG since patient does not have insurance. Patient stated she was able to go over medicaid application with [facility] Smalling counselors. Patient stated they made her aware of the different policy and what would be needed in the future for medicaid application. CSW to follow up with Assistant director Nathaniel Man LCSW, to see if LOG would be approved.  Employment status:  Systems developer information:  Self Pay (Medicaid Pending) PT Recommendations:  [place] / Referral to community resources:  [place]  Patient/Family's Response to care:  Family appreciative of CSW role in  patients care. Patient also stated she is thankful for what everyone has done for her  Patient/Family's Understanding of and Emotional Response to Diagnosis, Current Treatment, and Prognosis:  Family/patient has good understanding of recent hospitalization and limitations   Emotional Assessment Appearance:  Appears stated age Attitude/Demeanor/Rapport:  Other Affect (typically observed):  Appropriate Orientation:  Oriented to Self, Oriented to Situation, Oriented to Place, Oriented to  Time Alcohol / Substance use:  Not Applicable Psych involvement (Current and /or in the community):  No (Comment)  Discharge Needs  Concerns to be addressed:  Financial / Insurance Concerns Readmission within the last 30 days:  No Current discharge risk:  Lives alone Barriers to Discharge:  No Barriers Identified   Wende Neighbors, LCSW 12/05/2016, 12:49 PM

## 2016-12-05 NOTE — Progress Notes (Signed)
CARDIOLOGY   Doing well. No atrial fibrillation.  The patient wants to be followed in our [place] office after discharge ([place]).

## 2016-12-06 LAB — GLUCOSE, CAPILLARY
GLUCOSE-CAPILLARY: 112 mg/dL — AB (ref 65–99)
GLUCOSE-CAPILLARY: 122 mg/dL — AB (ref 65–99)
Glucose-Capillary: 127 mg/dL — ABNORMAL HIGH (ref 65–99)
Glucose-Capillary: 109 mg/dL — ABNORMAL HIGH (ref 65–99)

## 2016-12-06 LAB — BASIC METABOLIC PANEL
Anion gap: 8 (ref 5–15)
BUN: 15 mg/dL (ref 6–20)
CHLORIDE: 100 mmol/L — AB (ref 101–111)
CO2: 30 mmol/L (ref 22–32)
CREATININE: 0.75 mg/dL (ref 0.44–1.00)
Calcium: 8.6 mg/dL — ABNORMAL LOW (ref 8.9–10.3)
GFR calc Af Amer: >60 mL/min (ref >60)
GFR calc non Af Amer: >60 mL/min (ref >60)
GLUCOSE: 124 mg/dL — AB (ref 65–99)
POTASSIUM: 3.6 mmol/L (ref 3.5–5.1)
Sodium: 138 mmol/L (ref 135–145)

## 2016-12-06 MED ORDER — METOPROLOL TARTRATE 50 MG PO TABS
75.0000 mg | ORAL_TABLET | Freq: Two times a day (BID) | ORAL | Status: DC
  Administered 2016-12-06 – 2016-12-08 (×5): 75 mg via ORAL
  Filled 2016-12-06 (×5): qty 1

## 2016-12-06 MED ORDER — FUROSEMIDE 40 MG PO TABS
40.0000 mg | ORAL_TABLET | Freq: Every day | ORAL | Status: DC
  Administered 2016-12-06 – 2016-12-08 (×3): 40 mg via ORAL
  Filled 2016-12-06 (×3): qty 1

## 2016-12-06 MED ORDER — FUROSEMIDE 40 MG PO TABS: 40.0000 mg | ORAL_TABLET | Freq: Two times a day (BID) | ORAL | Status: DC

## 2016-12-06 MED ORDER — METOPROLOL TARTRATE 25 MG/10 ML ORAL SUSPENSION: 50.0000 mg | Freq: Two times a day (BID) | ORAL | Status: DC

## 2016-12-06 MED ORDER — ACETAMINOPHEN 500 MG PO TABS
1000.0000 mg | ORAL_TABLET | Freq: Once | ORAL | Status: AC
  Administered 2016-12-06: 1000 mg via ORAL

## 2016-12-06 NOTE — Progress Notes (Signed)
Pt with new acute onset of chest discomfort. Barrett, PA, notified. EKG obtained. Pt repositioned back into bed and reports relief with repositioning. VSS. Will continue to monitor.

## 2016-12-06 NOTE — Progress Notes (Addendum)
Patient diaphoric. CBG done 127.

## 2016-12-06 NOTE — Progress Notes (Signed)
CARDIAC REHAB PHASE I   PRE:  Rate/Rhythm: 99 SR  BP:  Supine:   Sitting: 140/97  Standing:    SaO2: 96%RA  MODE:  Ambulation: 500 ft   POST:  Rate/Rhythm: 119 ST  BP:  Supine:   Sitting: 151/99  Standing:    SaO2: 95%RA 1056-1120 Pt walked 500 ft on RA with rolling walker with fairly steady gait. Tolerated well. To recliner after walk. Sounds congested. Encouraged her to cough. Second walk today.   Graylon Good, RN BSN  12/06/2016 11:16 AM

## 2016-12-06 NOTE — Progress Notes (Addendum)
      [address]       [place] [address]             [phone]      5 Days Post-Op Procedure(s) (LRB): CORONARY ARTERY BYPASS GRAFTING (CABG) x four , using left internal mammary artery and right leg greater saphenous vein harvested endoscopically - LIMA to LAD, -SVG to DIAGONAL, SVG to OM2, SVG to PDA  (N/A) INTRAOPERATIVE TRANSESOPHAGEAL ECHOCARDIOGRAM (N/A)   Subjective:  [name] continues to feel better.  She continues to have a cough, which she states is likely a smokers cough.  Using incentive   + BM  Objective: Vital signs in last 24 hours: Temp:  [97.7 F (36.5 C)-98.4 F (36.9 C)] 97.7 F (36.5 C) (09/29 0534) Pulse Rate:  [91-110] 106 (09/28 2138) Cardiac Rhythm: Normal sinus rhythm (09/29 0700) Resp:  [20-27] 23 (09/29 0013) BP: (122-144)/(86-103) 141/101 (09/29 0534) SpO2:  [95 %-100 %] 100 % (09/29 0534) FiO2 (%):  [24 %-32 %] 24 % (09/28 1522) Weight:  [203 lb 3.2 oz (92.2 kg)] 203 lb 3.2 oz (92.2 kg) (09/29 0534)  Intake/Output from previous day: 09/28 0701 - 09/29 0700 In: 850 [P.O.:840] Out: 2000 [Urine:2000]  General appearance: alert, cooperative and no distress Heart: regular rate and rhythm Lungs: coarse throughout Abdomen: soft, non-tender; bowel sounds normal; no masses,  no organomegaly Extremities: edema trace Wound: clean and dry  Lab Results:  Recent Labs  12/04/16 0503 12/05/16 0319  WBC 18.5* 18.7*  HGB 10.0* 10.0*  HCT 30.8* 31.5*  PLT 211 258   BMET:  Recent Labs  12/05/16 0319 12/06/16 0438  NA 139 138  K 3.9 3.6  CL 101 100*  CO2 31 30  GLUCOSE 128* 124*  BUN 15 15  CREATININE 0.73 0.75  CALCIUM 8.6* 8.6*    PT/INR: No results for input(s): LABPROT, INR in the last 72 hours. ABG    Component Value Date/Time   PHART 7.331 (L) 12/02/2016 0059   HCO3 25.1 12/02/2016 0059   TCO2 27 12/02/2016 1536   ACIDBASEDEF 1.0 12/02/2016 0059   O2SAT 97.0 12/02/2016 0059   CBG (last 3)   Recent  Labs  12/05/16 2141 12/06/16 0156 12/06/16 0652  GLUCAP 114* 127* 112*    Assessment/Plan: S/P Procedure(s) (LRB): CORONARY ARTERY BYPASS GRAFTING (CABG) x four , using left internal mammary artery and right leg greater saphenous vein harvested endoscopically - LIMA to LAD, -SVG to DIAGONAL, SVG to OM2, SVG to PDA  (N/A) INTRAOPERATIVE TRANSESOPHAGEAL ECHOCARDIOGRAM (N/A)  1. CV- Sinus Tach, + HTN- increase Lopressor to 75 mg BID, continue Cozaar 2. Pulm- + COPD, continues to use incentive, continue nebs, mucinex will repeat CXR in AM.. Patient not requiring oxygen 3. Renal- creatinine has been stable, weight has trended down nicely with IV diuretics, will transition to oral reigmen 4. ID- afebrile, continue Levaquin to complete treatment for preoperative UTI 5. Deconditioning- SNF placement 6. DIspo- patient stable, titrate BB for HR and better BP control, continue aggressive pulmonary toilet, diurese, likely for SNF Monday   LOS: 9 days    BARRETT, ERIN 12/06/2016  I have seen and examined [name] and agree with the above assessment  and plan.  Grace Isaac MD Beeper [phone] Office [phone] 12/06/2016 7:31 PM

## 2016-12-07 ENCOUNTER — Inpatient Hospital Stay (HOSPITAL_COMMUNITY): Payer: Medicaid Other

## 2016-12-07 LAB — GLUCOSE, CAPILLARY
GLUCOSE-CAPILLARY: 100 mg/dL — AB (ref 65–99)
GLUCOSE-CAPILLARY: 128 mg/dL — AB (ref 65–99)
Glucose-Capillary: 126 mg/dL — ABNORMAL HIGH (ref 65–99)
Glucose-Capillary: 105 mg/dL — ABNORMAL HIGH (ref 65–99)

## 2016-12-07 LAB — BASIC METABOLIC PANEL
Anion gap: 8 (ref 5–15)
BUN: 14 mg/dL (ref 6–20)
CHLORIDE: 102 mmol/L (ref 101–111)
CO2: 30 mmol/L (ref 22–32)
CREATININE: 0.82 mg/dL (ref 0.44–1.00)
Calcium: 9 mg/dL (ref 8.9–10.3)
GFR calc Af Amer: >60 mL/min (ref >60)
GFR calc non Af Amer: >60 mL/min (ref >60)
Glucose, Bld: 124 mg/dL — ABNORMAL HIGH (ref 65–99)
POTASSIUM: 4.1 mmol/L (ref 3.5–5.1)
SODIUM: 140 mmol/L (ref 135–145)

## 2016-12-07 NOTE — Progress Notes (Addendum)
      [address]       [place] [address]             [phone]      6 Days Post-Op Procedure(s) (LRB): CORONARY ARTERY BYPASS GRAFTING (CABG) x four , using left internal mammary artery and right leg greater saphenous vein harvested endoscopically - LIMA to LAD, -SVG to DIAGONAL, SVG to OM2, SVG to PDA  (N/A) INTRAOPERATIVE TRANSESOPHAGEAL ECHOCARDIOGRAM (N/A)   Subjective:  Ms. Maule states she is doing okay.  She states her acid reflux is acting up this morning.  There is documentation the patient had chest pain yesterday.  However, I was not paged in regards to this.  EKG was obtained and was WNL  Objective: Vital signs in last 24 hours: Temp:  [98 F (36.7 C)-98.5 F (36.9 C)] 98.5 F (36.9 C) (09/29 2005) Pulse Rate:  [90-109] 96 (09/30 0400) Cardiac Rhythm: Sinus tachycardia (09/30 0400) Resp:  [16-25] 23 (09/30 0400) BP: (118-140)/(84-97) 137/93 (09/30 0400) SpO2:  [94 %-100 %] 97 % (09/30 0400)  Intake/Output from previous day: 09/29 0701 - 09/30 0700 In: 23 [P.O.:780] Out: 590 [Urine:300; Emesis/NG output:50; Stool:240]  General appearance: alert, cooperative and no distress Heart: regular rate and rhythm Lungs: clear to auscultation bilaterally Abdomen: soft, non-tender; bowel sounds normal; no masses,  no organomegaly Extremities: edema trace Wound: clean and dry  Lab Results:  Recent Labs  12/05/16 0319  WBC 18.7*  HGB 10.0*  HCT 31.5*  PLT 258   BMET:  Recent Labs  12/06/16 0438 12/07/16 0355  NA 138 140  K 3.6 4.1  CL 100* 102  CO2 30 30  GLUCOSE 124* 124*  BUN 15 14  CREATININE 0.75 0.82  CALCIUM 8.6* 9.0    PT/INR: No results for input(s): LABPROT, INR in the last 72 hours. ABG    Component Value Date/Time   PHART 7.331 (L) 12/02/2016 0059   HCO3 25.1 12/02/2016 0059   TCO2 27 12/02/2016 1536   ACIDBASEDEF 1.0 12/02/2016 0059   O2SAT 97.0 12/02/2016 0059   CBG (last 3)   Recent Labs  12/06/16 0652  12/06/16 1121 12/06/16 2245  GLUCAP 112* 122* 109*    Assessment/Plan: S/P Procedure(s) (LRB): CORONARY ARTERY BYPASS GRAFTING (CABG) x four , using left internal mammary artery and right leg greater saphenous vein harvested endoscopically - LIMA to LAD, -SVG to DIAGONAL, SVG to OM2, SVG to PDA  (N/A) INTRAOPERATIVE TRANSESOPHAGEAL ECHOCARDIOGRAM (N/A)  1. CV- NSR, tachycardia improved, BP improved- continue Lopressor, Cozaar 2. Pulm- + COPD, not on oxygen, continue aggressive pulmonary toilet for relief of sputum production.. CXR has improved, no significant pleural effusions, some bilateral atelectasis 3. Renal- creatinine has been stable, weight is improved, continued LE edema, continue Lasix, will order TED hose 4. ID- remains afebrile on Levaquin ( Day 5) to complete therapy for preoperative UTI, will get CBC to check white count tomorrow 5. Deconditioning- SNF placement 6. Dispo- patient stable, respiratory status continues to improve, continue aggressive pulm toilet, levaquin to complete today.. Will check CXR once completed, possibly ready for SNF in AM   LOS: 10 days    BARRETT, ERIN 12/07/2016  Wbc 18,000 on 9/28 Remains afebrile  Check labs in am I have seen and examined Sherren Kerns and agree with the above assessment  and plan.  Grace Isaac MD Beeper [phone] Office [phone] 12/07/2016 7:45 PM

## 2016-12-08 LAB — CBC
HCT: 32.7 % — ABNORMAL LOW (ref 36.0–46.0)
HEMOGLOBIN: 10.2 g/dL — AB (ref 12.0–15.0)
MCH: 27.1 pg (ref 26.0–34.0)
MCHC: 31.2 g/dL (ref 30.0–36.0)
MCV: 86.7 fL (ref 78.0–100.0)
Platelets: 382 10*3/uL (ref 150–400)
RBC: 3.77 MIL/uL — AB (ref 3.87–5.11)
RDW: 14.9 % (ref 11.5–15.5)
WBC: 15.8 10*3/uL — ABNORMAL HIGH (ref 4.0–10.5)

## 2016-12-08 LAB — GLUCOSE, CAPILLARY
GLUCOSE-CAPILLARY: 110 mg/dL — AB (ref 65–99)
GLUCOSE-CAPILLARY: 91 mg/dL (ref 65–99)

## 2016-12-08 MED ORDER — METOPROLOL TARTRATE 75 MG PO TABS
75.0000 mg | ORAL_TABLET | Freq: Two times a day (BID) | ORAL | 1 refills | Status: DC
Start: 2016-12-08 — End: 2016-12-29

## 2016-12-08 MED ORDER — FUROSEMIDE 40 MG PO TABS: 40.0000 mg | ORAL_TABLET | Freq: Every day | ORAL | 0 refills | Status: DC

## 2016-12-08 MED ORDER — LOSARTAN POTASSIUM 50 MG PO TABS
75.0000 mg | ORAL_TABLET | Freq: Every day | ORAL | Status: DC
  Administered 2016-12-08: 11:00:00 75 mg via ORAL
  Filled 2016-12-08: qty 1

## 2016-12-08 MED ORDER — OXYCODONE HCL 5 MG PO TABS: 5.0000 mg | ORAL_TABLET | Freq: Four times a day (QID) | ORAL | 0 refills | Status: DC | PRN

## 2016-12-08 MED ORDER — ATORVASTATIN CALCIUM 80 MG PO TABS: 80.0000 mg | ORAL_TABLET | Freq: Every day | ORAL | 1 refills | Status: DC

## 2016-12-08 MED ORDER — ASPIRIN 325 MG PO TBEC: 325.0000 mg | DELAYED_RELEASE_TABLET | Freq: Every day | ORAL | Status: DC

## 2016-12-08 MED ORDER — POTASSIUM CHLORIDE CRYS ER 20 MEQ PO TBCR: 20.0000 meq | EXTENDED_RELEASE_TABLET | Freq: Every day | ORAL | 0 refills | Status: DC

## 2016-12-08 MED ORDER — LOSARTAN POTASSIUM 25 MG PO TABS: 75.0000 mg | ORAL_TABLET | Freq: Every day | ORAL | 1 refills | Status: DC

## 2016-12-08 NOTE — Progress Notes (Signed)
Physical Therapy Treatment Patient Details Name: [name] MRN: [id number] DOB: 14-Mar-1950 Today's Date: 12/08/2016    History of Present Illness [name] is a 66 year old female with a past medical history of essential hypertension and hyperlipidemia who was returning to her car after work when she suddenly felt nauseous, weak, diaphoretic, and had some chest pain. EMS was called and they gave her aspirin and sublingual nitroglycerin. They obtained an EKG which showed inferior ST elevation which resolved when she arrived at the hospital. Found to have severe 3 vessel CAD with acute severe diastolic heart failure. s/p CABGx4 12/01/16    PT Comments    Patient required min A for sit to stand transfers to maintain sternal precautions and supervision for ambulation. Pt able to recall precautions beginning of session. Pt will continue to benefit from further skilled PT services.    Follow Up Recommendations  Home health PT;Supervision for mobility/OOB     Equipment Recommendations  Other (comment) (to be determined at next venue)    Recommendations for Other Services       Precautions / Restrictions Precautions Precautions: Sternal Precaution Comments: pt able to recall precautions Restrictions Weight Bearing Restrictions: No Other Position/Activity Restrictions: sternal precautions.    Mobility  Bed Mobility               General bed mobility comments: pt OOB in chair upon arrival  Transfers Overall transfer level: Needs assistance Equipment used: Rolling walker (2 wheeled) Transfers: Sit to/from Stand Sit to Stand: Min assist         General transfer comment: assist to power up into standing to maintain precautions; cues for technique; pt hugging cardiac pillow and used momentum to stand  Ambulation/Gait Ambulation/Gait assistance: Supervision Ambulation Distance (Feet): 24 Feet Assistive device: Rolling walker (2 wheeled) Gait Pattern/deviations:  Step-through pattern;Decreased stride length Gait velocity: decreased   General Gait Details: supervision for safety   Stairs            Wheelchair Mobility    Modified Rankin (Stroke Patients Only)       Balance Overall balance assessment: Needs assistance Sitting-balance support: Feet unsupported;No upper extremity supported Sitting balance-Leahy Scale: Good     Standing balance support: During functional activity Standing balance-Leahy Scale: Fair                              Cognition Arousal/Alertness: Awake/alert Behavior During Therapy: WFL for tasks assessed/performed Overall Cognitive Status: Within Functional Limits for tasks assessed                                        Exercises      General Comments        Pertinent Vitals/Pain Pain Assessment: Faces Faces Pain Scale: Hurts a little bit Pain Location: chest when coughing Pain Descriptors / Indicators: Discomfort Pain Intervention(s): Monitored during session    Home Living                      Prior Function            PT Goals (current goals can now be found in the care plan section) Acute Rehab PT Goals PT Goal Formulation: With patient Time For Goal Achievement: 12/11/16 Potential to Achieve Goals: Good Progress towards PT goals: Progressing toward goals    Frequency  Min 3X/week      PT Plan Discharge plan needs to be updated    Co-evaluation              AM-PAC PT "6 Clicks" Daily Activity  Outcome Measure  Difficulty turning over in bed (including adjusting bedclothes, sheets and blankets)?: A Lot Difficulty moving from lying on back to sitting on the side of the bed? : A Lot Difficulty sitting down on and standing up from a chair with arms (e.g., wheelchair, bedside commode, etc,.)?: Unable Help needed moving to and from a bed to chair (including a wheelchair)?: A Little Help needed walking in hospital room?:  None Help needed climbing 3-5 steps with a railing? : A Lot 6 Click Score: 14    End of Session Equipment Utilized During Treatment: Gait belt Activity Tolerance: Patient tolerated treatment well Patient left: in chair;with call bell/phone within reach;Other (comment) (MD present and discussing d/c plan) Nurse Communication: Mobility status PT Visit Diagnosis: Other abnormalities of gait and mobility (R26.89);Muscle weakness (generalized) (M62.81);Difficulty in walking, not elsewhere classified (R26.2)     Time: 4103-0131 PT Time Calculation (min) (ACUTE ONLY): 23 min  Charges:  $Gait Training: 8-22 mins $Therapeutic Activity: 8-22 mins                    G Codes:       Earney Navy, PTA Pager: [phone]     Darliss Cheney 12/08/2016, 4:28 PM

## 2016-12-08 NOTE — Progress Notes (Signed)
Occupational Therapy Treatment Patient Details Name: [name] MRN: [id number] DOB: Oct 28, 1950 Today's Date: 12/08/2016    History of present illness [name] is a 66 year old female with a past medical history of essential hypertension and hyperlipidemia who was returning to her car after work when she suddenly felt nauseous, weak, diaphoretic, and had some chest pain. EMS was called and they gave her aspirin and sublingual nitroglycerin. They obtained an EKG which showed inferior ST elevation which resolved when she arrived at the hospital. Found to have severe 3 vessel CAD with acute severe diastolic heart failure. s/p CABGx4 12/01/16   OT comments  Pt making great progress with adls and adl transfers. Pt continues to need minimal assist to power up from a normal height chair due to sternal precautions. Pt doing very well with all adls in sitting and once in standing.  Pt following sternal precautions and has people to look in on her at home but not 24 hour S, therefore short term SNF recommended.   Follow Up Recommendations  SNF    Equipment Recommendations  None recommended by OT    Recommendations for Other Services      Precautions / Restrictions Precautions Precautions: Sternal Restrictions Weight Bearing Restrictions: No Other Position/Activity Restrictions: sternal precautions.       Mobility Bed Mobility               General bed mobility comments: Pt in chair on arrival.  Transfers Overall transfer level: Needs assistance Equipment used: 1 person hand held assist Transfers: Sit to/from Stand Sit to Stand: Min assist         General transfer comment: minA for powerup maintaining sternal precautions, vc for scooting hips further forward before power up    Balance Overall balance assessment: Needs assistance Sitting-balance support: Feet unsupported;No upper extremity supported Sitting balance-Leahy Scale: Good     Standing balance support:  During functional activity Standing balance-Leahy Scale: Fair Standing balance comment: requires at single UE support for balance                           ADL either performed or assessed with clinical judgement   ADL Overall ADL's : Needs assistance/impaired Eating/Feeding: Independent   Grooming: Wash/dry hands;Wash/dry face;Oral care;Standing;Min guard   Upper Body Bathing: Supervision/ safety   Lower Body Bathing: Minimal assistance;Sit to/from stand Lower Body Bathing Details (indicate cue type and reason): Pt only needs assist to power up to stand b/c of sternal precautions.  Once up pt bathes herself. Upper Body Dressing : Set up;Sitting Upper Body Dressing Details (indicate cue type and reason): if wearing button up clothes.  Talked to pt about wearing button up or very loose fitting pull over. Instructed pt to flex neck downward to pull loose pullover over head. Lower Body Dressing: Minimal assistance;Sit to/from stand Lower Body Dressing Details (indicate cue type and reason): Pt can donn socks/underwear/shoes etc.  Only needs assist to come sit to stand. Toilet Transfer: Minimal assistance;RW   Toileting- Clothing Manipulation and Hygiene: Minimal assistance;Sit to/from stand       Functional mobility during ADLs: Minimal assistance General ADL Comments: Pt needing only min assist for sit to stand during LB selfcare.  Min assist for transition of UB to sitting from supine with HOB elevated slightly.       Vision   Vision Assessment?: No apparent visual deficits   Perception     Praxis  Cognition Arousal/Alertness: Awake/alert Behavior During Therapy: WFL for tasks assessed/performed Overall Cognitive Status: Within Functional Limits for tasks assessed                                 General Comments: Pt motivated and doing well.        Exercises     Shoulder Instructions       General Comments Pt contiues to need min  assist from lower surfaces to power up to stand during adls. Otherwise, pt is completing adls with very little assist. Pt remains on O2.    Pertinent Vitals/ Pain       Pain Assessment: Faces Faces Pain Scale: Hurts a little bit Pain Location: chest Pain Descriptors / Indicators: Discomfort Pain Intervention(s): Limited activity within patient's tolerance;Monitored during session;Repositioned  Home Living                                          Prior Functioning/Environment              Frequency  Min 2X/week        Progress Toward Goals  OT Goals(current goals can now be found in the care plan section)  Progress towards OT goals: Progressing toward goals  Acute Rehab OT Goals Patient Stated Goal: To get back to work Time For Goal Achievement: 12/18/16 Potential to Achieve Goals: Good ADL Goals Pt Will Perform Grooming: with modified independence;standing Pt Will Perform Lower Body Bathing: with modified independence;sit to/from stand Pt Will Perform Lower Body Dressing: with modified independence;sit to/from stand Pt Will Transfer to Toilet: with modified independence Pt Will Perform Toileting - Clothing Manipulation and hygiene: with modified independence;sit to/from stand Pt Will Perform Tub/Shower Transfer: with modified independence;shower seat;ambulating;rolling walker  Plan Discharge plan remains appropriate    Co-evaluation                 AM-PAC PT "6 Clicks" Daily Activity     Outcome Measure   Help from another person eating meals?: None Help from another person taking care of personal grooming?: A Little Help from another person toileting, which includes using toliet, bedpan, or urinal?: A Little Help from another person bathing (including washing, rinsing, drying)?: A Little Help from another person to put on and taking off regular upper body clothing?: A Little Help from another person to put on and taking off regular lower  body clothing?: A Little 6 Click Score: 19    End of Session Equipment Utilized During Treatment: Rolling walker  OT Visit Diagnosis: Unsteadiness on feet (R26.81)   Activity Tolerance Patient tolerated treatment well   Patient Left in chair;with call bell/phone within reach   Nurse Communication Mobility status        Time: 0940-1003 OT Time Calculation (min): 23 min  Charges: OT General Charges $OT Visit: 1 Visit OT Treatments $Self Care/Home Management : 23-37 mins  Jinger Neighbors, OTR/L   Glenford Peers 12/08/2016, 10:09 AM

## 2016-12-08 NOTE — Progress Notes (Signed)
Late entry: assumed patient from off going RN; patient alert & oriented; patient ambulated twice throughout shift; patient educated about home care; patient had concerns and was addressed by MD @ 1650; patient was offered to stay the night for family to have time to make adjustments; patient refused and wanted to go home; discharge instructions given to son and explained; patient discharge; sutures removed as protocol

## 2016-12-08 NOTE — Progress Notes (Signed)
CARDIAC REHAB PHASE I  Pt ambulated on RA with nurse tech this morning, just finished working with OT, PT to see as well. Pt declines additional ambulation at this time. Cardiac surgery discharge education completed with pt at bedside. Reviewed risk factors, tobacco cessation (pt declined fake cigarette), IS, sternal precautions, activity progression, exercise, heart healthy and diabetes diet handouts, daily weights and phase 2 cardiac rehab. Pt verbalized understanding, receptive to education. Pt agrees to phase 2 cardiac rehab referral, will send to [facility] per pt request. Pt watching cardiac surgery discharge video, in recliner, call light within reach. Will follow if pt does not discharge today.   [id number] Lenna Sciara, RN, BSN 12/08/2016 10:38 AM

## 2016-12-08 NOTE — Progress Notes (Signed)
Clinical Social Worker spoke with patient at bedside and stated that she would not be eligible for an LOG. CSW made CMRN aware that patient will be discharging home with some home health to follow. Patient stated her son will transport her home once he gets out of work. CSW signing off as patient no longer has social work needs. Please consult social work again if any new needs arise. \  Rhea Pink, MSW,  [place]

## 2016-12-08 NOTE — Progress Notes (Addendum)
[address]       [place] [address]             [phone]      7 Days Post-Op Procedure(s) (LRB): CORONARY ARTERY BYPASS GRAFTING (CABG) x four , using left internal mammary artery and right leg greater saphenous vein harvested endoscopically - LIMA to LAD, -SVG to DIAGONAL, SVG to OM2, SVG to PDA  (N/A) INTRAOPERATIVE TRANSESOPHAGEAL ECHOCARDIOGRAM (N/A) Subjective: Feels pretty well, some productive cough  Objective: Vital signs in last 24 hours: Temp:  [97.6 F (36.4 C)-98 F (36.7 C)] 97.8 F (36.6 C) (10/01 0235) Pulse Rate:  [89] 89 (09/30 1227) Cardiac Rhythm: Normal sinus rhythm;Bundle branch block (10/01 0701) Resp:  [17] 17 (10/01 0235) BP: (134-151)/(91-98) 151/93 (10/01 0235) SpO2:  [95 %-98 %] 98 % (09/30 2027) Weight:  [203 lb 9.6 oz (92.4 kg)] 203 lb 9.6 oz (92.4 kg) (10/01 0235)  Hemodynamic parameters for last 24 hours:    Intake/Output from previous day: 09/30 0701 - 10/01 0700 In: 960 [P.O.:960] Out: 250 [Urine:250] Intake/Output this shift: No intake/output data recorded.  General appearance: alert, cooperative and no distress Heart: regular rate and rhythm Lungs: scattered ronchi Abdomen: benign Extremities: + edema R>L LE Wound: incis healing well  Lab Results:  Recent Labs  12/08/16 0238  WBC 15.8*  HGB 10.2*  HCT 32.7*  PLT 382   BMET:  Recent Labs  12/06/16 0438 12/07/16 0355  NA 138 140  K 3.6 4.1  CL 100* 102  CO2 30 30  GLUCOSE 124* 124*  BUN 15 14  CREATININE 0.75 0.82  CALCIUM 8.6* 9.0    PT/INR: No results for input(s): LABPROT, INR in the last 72 hours. ABG    Component Value Date/Time   PHART 7.331 (L) 12/02/2016 0059   HCO3 25.1 12/02/2016 0059   TCO2 27 12/02/2016 1536   ACIDBASEDEF 1.0 12/02/2016 0059   O2SAT 97.0 12/02/2016 0059   CBG (last 3)   Recent Labs  12/07/16 1622 12/07/16 2021 12/08/16 0612  GLUCAP 126* 105* 110*    Meds Scheduled Meds: . aspirin EC  325  mg Oral Daily   Or  . aspirin  324 mg Per Tube Daily  . atorvastatin  80 mg Oral q1800  . bisacodyl  10 mg Oral Daily   Or  . bisacodyl  10 mg Rectal Daily  . docusate sodium  200 mg Oral Daily  . enoxaparin (LOVENOX) injection  40 mg Subcutaneous QHS  . furosemide  40 mg Oral Daily  . guaiFENesin  1,200 mg Oral BID  . losartan  50 mg Oral Daily  . metoprolol tartrate  75 mg Oral BID   Or  . metoprolol tartrate  50 mg Per Tube BID  . moving right along book   Does not apply Once  . pantoprazole  40 mg Oral Daily  . potassium chloride  20 mEq Oral BID  . sodium chloride flush  3 mL Intravenous Q12H   Continuous Infusions: . sodium chloride     PRN Meds:.sodium chloride, alum & mag hydroxide-simeth, labetalol, magnesium hydroxide, ondansetron (ZOFRAN) IV, oxyCODONE, sodium chloride flush, traMADol, zolpidem  Xrays Dg Chest 2 View  Result Date: 12/07/2016 CLINICAL DATA:  Followup CHF. EXAM: CHEST  2 VIEW COMPARISON:  12/04/2016. FINDINGS: Stable enlarged cardiac silhouette and post CABG changes. Decreased prominence of the pulmonary vasculature. Stable mild prominence of the interstitial markings. Small right pleural effusion. Minimal bibasilar linear atelectasis.  Diffuse osteopenia. IMPRESSION: 1. Resolved mild pulmonary vascular congestion. 2. Stable mild chronic interstitial lung disease and possible mild interstitial pulmonary edema. 3. Small right pleural effusion. 4. Minimal bibasilar atelectasis. 5. Stable cardiomegaly. Electronically Signed   By: Claudie Revering M.D.   On: 12/07/2016 11:34    Assessment/Plan: S/P Procedure(s) (LRB): CORONARY ARTERY BYPASS GRAFTING (CABG) x four , using left internal mammary artery and right leg greater saphenous vein harvested endoscopically - LIMA to LAD, -SVG to DIAGONAL, SVG to OM2, SVG to PDA  (N/A) INTRAOPERATIVE TRANSESOPHAGEAL ECHOCARDIOGRAM (N/A)  1 doing well 2 some tachycardia at times, cont current metoprolol dose 3 BP  control on  current ARB fair, will increase dose , cont gentle diuresis 4 CBG result is ok, A1C is 6 so needs good dietary management 5 leukocytosis conts to improve, no fevers 6 awaits placement   LOS: 11 days    GOLD,WAYNE E 12/08/2016 Patient seen and examined, agree with above Awaiting SNF  Remo Lipps C. Roxan Hockey, MD [facility] [phone]

## 2016-12-08 NOTE — Care Management Note (Addendum)
Case Management Note Original Note Created Zenon Mayo, RN 11/28/2016, 2:35 PM   Patient Details  Name: [name] MRN: [id number] Date of Birth: [birth year]  Subjective/Objective:   From home alone, presents with STEMI, s/p cath shows 3 vessel dz, for CT surgery eval.    No PCP or insurance listed, NCM scheduled a hospital folluw up apt at the [facility] for Oct 12 at  1 pm, she will be able to utilize the [facility] clinic also for medications.    9/25 [place], BSN- s/p CABG yestereday, on vent , now 4 liters, chest tubes to suction, prededex, neo.   9/26 1118 Tomi Bamberger RN, BSN - patient son works, she has a cousin, Jacques Navy, who lives in [facility], [place] she is coming in on Thursday but will not be staying with patient.  NCM spoke with Constance Holster and she states for NCM to contact Rosebud Poles at [phone] to help with dispo .  Patient gave NCM permission to speak with him also . NCM left message for him to return call.  Patient will need pt/ot eval.  NCM informed floor RN, she will make sure this is ordered. NCM left [place] agency list in patient room in case recs are for [facility] services.  she             Action/Plan: NCM will follow for dc needs.   Expected Discharge Date:  12/08/16               Expected Discharge Plan:  [place]  In-House Referral:  Clinical Social Work  Discharge planning Services  CM Consult  Post Acute Care Choice:  Home Health, Durable Medical Equipment Choice offered to:  Patient  DME Arranged:  3-N-1, Walker rolling DME Agency:  [place]:  RN [place] Agency:  [place]  Status of Service:  Completed, signed off  If discussed at [place] of Stay Meetings, dates discussed:    Discharge Disposition: home/home health   Additional Comments:  12/08/16- 1530- Marvetta Gibbons RN,, CM- pt ready for d/c today- CSW had been following for SNF placement-  however pt now ambulating 500 ft and no longer qualifies for STSNF with LOG- pt has been informed along with attending team- plan will be for pt to return home- son will come pick pt up this evening- HHRN ordered along with DME- RW and 3n1- spoke with pt at bedside- regarding charity care with [facility] and DME needs- referral called to [facility] for [facility] and DME needs RW and 3n1 to be delivered to room prior to discharge- explained to pt that family would need to check in on pt through out day- pt asked about pads for bed- HH does not provide these-pt can purchase at [facility] or other stores. Also asked about "help" at home- explained to pt that even if she had insurance this kind of help would be covered for cooking, cleaning and such. Pt has been set up with [facility]- info provided on clinic- also went over d/c meds- and CM will assist pt with MATCH including pain meds- MATCH letter given to pt along with list of pharmacies- explained one time use and copay cost $3 per script- pt states she can afford this copay for discharge.  Pt asking about "emergency medicaid" have placed call to [place] regarding this and msg left for her to f/u with pt  and her questions.   Dahlia Client [place], RN 12/08/2016, 3:53 PM [phone]

## 2016-12-08 NOTE — Progress Notes (Signed)
      [address]       ,[place] [address]             [phone]      Received call from [name] cousin who was concerned about her going home.   [name] is unquestionably ready for hospital dc. Unfortunately due to financial issues she cannot go to a SNF which would the best option.  I spoke with [name] and offered to keep her overnight so that any lingering concerns could be addressed but she is set on going home this evening.  She has a small amount of serosanguinous drainage from the lower part of her sternal incision. She will change the dressing as needed.  [name] Roxan Hockey, MD [facility] [phone]

## 2016-12-16 ENCOUNTER — Telehealth: Payer: Self-pay | Admitting: *Deleted

## 2016-12-16 ENCOUNTER — Other Ambulatory Visit: Payer: Self-pay | Admitting: *Deleted

## 2016-12-16 ENCOUNTER — Telehealth: Payer: Self-pay | Admitting: Cardiology

## 2016-12-16 DIAGNOSIS — R601 Generalized edema: Secondary | ICD-10-CM

## 2016-12-16 MED ORDER — FUROSEMIDE 40 MG PO TABS: 40.0000 mg | ORAL_TABLET | Freq: Every day | ORAL | 0 refills | Status: DC

## 2016-12-16 MED ORDER — POTASSIUM CHLORIDE CRYS ER 20 MEQ PO TBCR: 20.0000 meq | EXTENDED_RELEASE_TABLET | Freq: Every day | ORAL | 0 refills | Status: DC

## 2016-12-16 NOTE — Telephone Encounter (Addendum)
[name] [facility] has called to relate her findings when visiting her today. She states that she continues with LE edema and [name] feels as if she has fullness in her upper torso. She has finished her Lasix/KCL 7 day course. She will see the cardiologist soon. Lasix/KCL refilled until seen.

## 2016-12-16 NOTE — Telephone Encounter (Signed)
Lm2cb 

## 2016-12-16 NOTE — Telephone Encounter (Signed)
New message    Annisa from [facility] is calling. She states that pt has been home about a week after having heart surgery. She wanted an appt to come in sooner. Scheduled for Thursday 12/18/16 at 8 am with Dr. Ellyn Hack  Pt c/o swelling: STAT is pt has developed SOB within 24 hours  1) How much weight have you gained and in what time span? no  2) If swelling, where is the swelling located? Legs and chest. She said pt states she feels tight  3) Are you currently taking a fluid pill? Yes, lasix.  4) Are you currently SOB? Yes   5) Do you have a log of your daily weights (if so, list)? No   6) Have you gained 3 pounds in a day or 5 pounds in a week? no  7) Have you traveled recently? no

## 2016-12-17 NOTE — Telephone Encounter (Signed)
SPOKE TO PATIENT . SHE RECEIVED RX FROM SURGEON 'S OFFICE LASIX 40 MG  14 DAY SUPPLY KEEP APPOINTMENT FOR TOMORROW. PATIENT VERBALIZED UNDERSTANDING.

## 2016-12-18 ENCOUNTER — Encounter: Payer: Self-pay | Admitting: Cardiology

## 2016-12-18 ENCOUNTER — Ambulatory Visit: Payer: Self-pay | Admitting: Cardiology

## 2016-12-18 DIAGNOSIS — Z720 Tobacco use: Secondary | ICD-10-CM | POA: Insufficient documentation

## 2016-12-18 DIAGNOSIS — I080 Rheumatic disorders of both mitral and aortic valves: Secondary | ICD-10-CM | POA: Insufficient documentation

## 2016-12-18 NOTE — Progress Notes (Deleted)
PCP: No primary care provider on file.  Clinic Note: No chief complaint on file.   HPI: [name] is a 66 y.o. female with a PMH below who presents today for hospital follow-up after presenting with an aborted inferior STEMI, found to have multivessel disease for CABG. She was supposed to be discharged to a skilled nursing facility for short-term rehabilitation- but ended up going home b/c $ concerns.  [name] was seen on early morning September 20 when she presented with substernal chest pain. Her EKG upon arrival to the ER showed severe inferior ST elevations. However after she was treated with aspirin and heparin in the emergency room, her symptoms abated and her EKG improved back to baseline. Despite this, we decided to take her to the Cath Lab where she was found to have multivessel disease with severe distal RCA disease not very amenable to PCI. She underwent CABG on May 24.  Recent Hospitalizations: D/c 12/08/2016 post-CABG; she was noted to have a UTI on admission was treated with antibiotics.  Studies Personally Reviewed - (if available, images/films reviewed: From Epic Chart or Care Everywhere)  Cardiac Cath 11/27/16 (initially presented as aborted Inf STEMI): Very difficult situation with the patient has severe 2-3 vessel disease involving extensive disease in the distal RCA as well as ostial proximal and mid LAD. --> CABG Prox RCA-2 lesion, 60 %stenosed. Prox-mid RCA-1 lesion, 55 %stenosed.  Dist RCA-2 lesion, 95 %stenosed. Dist RCA-1 lesion, 90 %stenosed. - This combination is likely culprit lesion, however with very tortuous RCA in a stable patient did not feel it was a good idea to attempt PTCA at this time.  Ost LAD lesion, 65 %stenosed. Mid LAD lesion, 90 %stenosed. Dist LAD lesion, 50 %stenosed. - This is beyond the 2nd Diag branch.  Ost 1st Diag to 1st Diag lesion, 50 %stenosed. 1st Diag lesion, 60 %stenosed.  2nd Mrg lesion, 60 %stenosed.  The left  ventricular systolic function is normal. The left ventricular ejection fraction is 55-65% by visual estimate.  LV end diastolic pressure is severely elevated. 32-35 mmhg  There is moderate (3+) mitral regurgitation. Diagnostic Diagram    2-D Echo 9/20: EF 60-65% but no RWMA. Diastolic dysfunction noted but not calculated  CABG X 4: LIMA-LAD, SVG-Diag1, SVG-OM2, SVG-rPDA.  Intra-OP TEE: EF 55-65%. No RWMA. MIld AI-- (post CABG, EF ~50%, Mod AI. MIld MR  Interval History: ***   No chest pain or shortness of breath with rest or exertion. No PND, orthopnea or edema. No palpitations, lightheadedness, dizziness, weakness or syncope/near syncope. No TIA/amaurosis fugax symptoms. No melena, hematochezia, hematuria, or epstaxis. No claudication.  ROS: A comprehensive was performed. ROS   I have reviewed and (if needed) personally updated the patient's problem list, medications, allergies, past medical and surgical history, social and family history.   Past Medical History:  Diagnosis Date  . Colon polyps     Past Surgical History:  Procedure Laterality Date  . ABDOMINAL HYSTERECTOMY    . CORONARY ARTERY BYPASS GRAFT N/A 12/01/2016   Procedure: CORONARY ARTERY BYPASS GRAFTING (CABG) x four , using left internal mammary artery and right leg greater saphenous vein harvested endoscopically - LIMA to LAD, -SVG to DIAGONAL, SVG to OM2, SVG to PDA ;  Surgeon: Melrose Nakayama, MD;  Location: [facility];  Service: Open Heart Surgery;  Laterality: N/A;  . INTRAOPERATIVE TRANSESOPHAGEAL ECHOCARDIOGRAM N/A 12/01/2016   Procedure: INTRAOPERATIVE TRANSESOPHAGEAL ECHOCARDIOGRAM;  Surgeon: Melrose Nakayama, MD;  Location: [facility];  Service: Open Heart Surgery;  Laterality: N/A;  . LEFT HEART CATH AND CORONARY ANGIOGRAPHY N/A 11/27/2016   Procedure: LEFT HEART CATH AND CORONARY ANGIOGRAPHY;  Surgeon: Leonie Man, MD;  Location: [facility];  Service: Cardiovascular;  Laterality: N/A;     No outpatient prescriptions have been marked as taking for the 12/18/16 encounter (Appointment) with Leonie Man, MD.    No Known Allergies  Social History   Social History  . Marital status: Unknown    Spouse name: N/A  . Number of children: N/A  . Years of education: N/A   Social History Main Topics  . Smoking status: Current Every Day Smoker    Packs/day: 1.00    Years: 42.00    Types: Cigarettes  . Smokeless tobacco: Never Used  . Alcohol use No  . Drug use: No  . Sexual activity: Not on file   Other Topics Concern  . Not on file   Social History Narrative  . No narrative on file    family history includes Heart disease in her brother.  Wt Readings from Last 3 Encounters:  12/08/16 203 lb 9.6 oz (92.4 kg)    PHYSICAL EXAM There were no vitals taken for this visit. Physical Exam   Adult ECG Report  Rate: *** ;  Rhythm: {rhythm:17366};   Narrative Interpretation: ***   Other studies Reviewed: Additional studies/ records that were reviewed today include:  Recent Labs:  ***     ASSESSMENT / PLAN: Problem List Items Addressed This Visit    None      Current medicines are reviewed at length with the patient today. (+/- concerns) *** The following changes have been made: ***  There are no Patient Instructions on file for this visit.  Studies Ordered:   No orders of the defined types were placed in this encounter.     Glenetta Hew, M.D., M.S. Interventional Cardiologist   Pager # [phone] Phone # [phone] [address] [place],  [address]

## 2016-12-18 NOTE — Assessment & Plan Note (Deleted)
Recent Hospitalizations: D/c 12/08/2016 post-CABG; she was noted to have a UTI on admission was treated with antibiotics.  Studies Personally Reviewed -   Cardiac Cath 11/27/16 (initially presented as aborted Inf STEMI): Very difficult situation with the patient has severe 2-3 vessel disease involving extensive disease in the distal RCA as well as ostial proximal and mid LAD. --> CABG Prox RCA-2 lesion, 60 %stenosed. Prox-mid RCA-1 lesion, 55 %stenosed.  Dist RCA-2 lesion, 95 %stenosed. Dist RCA-1 lesion, 90 %stenosed. - This combination is likely culprit lesion, however with very tortuous RCA in a stable patient did not feel it was a good idea to attempt PTCA at this time.  Ost LAD lesion, 65 %stenosed. Mid LAD lesion, 90 %stenosed. Dist LAD lesion, 50 %stenosed. - This is beyond the 2nd Diag branch.  Ost 1st Diag to 1st Diag lesion, 50 %stenosed. 1st Diag lesion, 60 %stenosed.  2nd Mrg lesion, 60 %stenosed.  The left ventricular systolic function is normal. The left ventricular ejection fraction is 55-65% by visual estimate.  LV end diastolic pressure is severely elevated. 32-35 mmhg  There is moderate (3+) mitral regurgitation. Diagnostic Diagram    2-D Echo 9/20: EF 60-65% but no RWMA. Diastolic dysfunction noted but not calculated  CABG X 4: LIMA-LAD, SVG-Diag1, SVG-OM2, SVG-rPDA.  Intra-OP TEE: EF 55-65%. No RWMA. MIld AI-- (post CABG, EF ~50%, Mod AI. MIld MR

## 2016-12-19 ENCOUNTER — Ambulatory Visit (INDEPENDENT_AMBULATORY_CARE_PROVIDER_SITE_OTHER): Payer: Self-pay | Admitting: Family Medicine

## 2016-12-19 ENCOUNTER — Encounter: Payer: Self-pay | Admitting: Family Medicine

## 2016-12-19 ENCOUNTER — Encounter: Payer: Self-pay | Admitting: *Deleted

## 2016-12-19 VITALS — BP 120/74 | HR 86 | Temp 98.9°F | Resp 16 | Ht 67.0 in | Wt 196.0 lb

## 2016-12-19 DIAGNOSIS — I2584 Coronary atherosclerosis due to calcified coronary lesion: Secondary | ICD-10-CM

## 2016-12-19 DIAGNOSIS — E785 Hyperlipidemia, unspecified: Secondary | ICD-10-CM

## 2016-12-19 DIAGNOSIS — I509 Heart failure, unspecified: Secondary | ICD-10-CM

## 2016-12-19 DIAGNOSIS — Z951 Presence of aortocoronary bypass graft: Secondary | ICD-10-CM

## 2016-12-19 DIAGNOSIS — Z720 Tobacco use: Secondary | ICD-10-CM

## 2016-12-19 DIAGNOSIS — I2510 Atherosclerotic heart disease of native coronary artery without angina pectoris: Secondary | ICD-10-CM

## 2016-12-19 DIAGNOSIS — Z23 Encounter for immunization: Secondary | ICD-10-CM

## 2016-12-19 DIAGNOSIS — R7303 Prediabetes: Secondary | ICD-10-CM

## 2016-12-19 LAB — COMPLETE METABOLIC PANEL WITH GFR
AG Ratio: 1 (calc) (ref 1.0–2.5)
ALKALINE PHOSPHATASE (APISO): 105 U/L (ref 33–130)
ALT: 13 U/L (ref 6–29)
AST: 15 U/L (ref 10–35)
Albumin: 3.8 g/dL (ref 3.6–5.1)
BILIRUBIN TOTAL: 0.5 mg/dL (ref 0.2–1.2)
BUN: 15 mg/dL (ref 7–25)
CHLORIDE: 102 mmol/L (ref 98–110)
CO2: 30 mmol/L (ref 20–32)
Calcium: 10.1 mg/dL (ref 8.6–10.4)
Creat: 0.89 mg/dL (ref 0.50–0.99)
GFR, EST AFRICAN AMERICAN: 79 mL/min/{1.73_m2} (ref >=60)
GFR, Est Non African American: 68 mL/min/{1.73_m2} (ref >=60)
GLUCOSE: 101 mg/dL — AB (ref 65–99)
Globulin: 3.7 g/dL (calc) (ref 1.9–3.7)
Potassium: 4.7 mmol/L (ref 3.5–5.3)
Sodium: 140 mmol/L (ref 135–146)
TOTAL PROTEIN: 7.5 g/dL (ref 6.1–8.1)

## 2016-12-19 LAB — CBC WITH DIFFERENTIAL/PLATELET
BASOS PCT: 0.5 %
Basophils Absolute: 52 cells/uL (ref 0–200)
EOS ABS: 268 {cells}/uL (ref 15–500)
Eosinophils Relative: 2.6 %
HCT: 33.4 % — ABNORMAL LOW (ref 35.0–45.0)
Hemoglobin: 10.6 g/dL — ABNORMAL LOW (ref 11.7–15.5)
Lymphs Abs: 3100 cells/uL (ref 850–3900)
MCH: 27 pg (ref 27.0–33.0)
MCHC: 31.7 g/dL — ABNORMAL LOW (ref 32.0–36.0)
MCV: 85 fL (ref 80.0–100.0)
MPV: 9.7 fL (ref 7.5–12.5)
Monocytes Relative: 7.7 %
Neutro Abs: 6087 cells/uL (ref 1500–7800)
Neutrophils Relative %: 59.1 %
Platelets: 498 10*3/uL — ABNORMAL HIGH (ref 140–400)
RBC: 3.93 10*6/uL (ref 3.80–5.10)
RDW: 13.9 % (ref 11.0–15.0)
TOTAL LYMPHOCYTE: 30.1 %
WBC mixed population: 793 cells/uL (ref 200–950)
WBC: 10.3 10*3/uL (ref 3.8–10.8)

## 2016-12-19 NOTE — Patient Instructions (Addendum)
When you need refills on your medication feel free to call here to the office and we can have [facility]   Complete [place] Patient Assistance Application.   Prediabetes Prediabetes is the condition of having a blood sugar (blood glucose) level that is higher than it should be, but not high enough for you to be diagnosed with type 2 diabetes. Having prediabetes puts you at risk for developing type 2 diabetes (type 2 diabetes mellitus). Prediabetes may be called impaired glucose tolerance or impaired fasting glucose. Prediabetes usually does not cause symptoms. Your health care provider can diagnose this condition with blood tests. You may be tested for prediabetes if you are overweight and if you have at least one other risk factor for prediabetes. Risk factors for prediabetes include:  Having a family member with type 2 diabetes.  Being overweight or obese.  Being older than age 5.  Being of American-Indian, African-American, Hispanic/Latino, or Asian/Pacific Islander descent.  Having an inactive (sedentary) lifestyle.  Having a history of gestational diabetes or polycystic ovarian syndrome (PCOS).  Having low levels of good cholesterol (HDL-C) or high levels of blood fats (triglycerides).  Having high blood pressure.  What is blood glucose and how is blood glucose measured?  Blood glucose refers to the amount of glucose in your bloodstream. Glucose comes from eating foods that contain sugars and starches (carbohydrates) that the body breaks down into glucose. Your blood glucose level may be measured in mg/dL (milligrams per deciliter) or mmol/L (millimoles per liter).Your blood glucose may be checked with one or more of the following blood tests:  A fasting blood glucose (FBG) test. You will not be allowed to eat (you will fast) for at least 8 hours before a blood sample is taken. ? A normal range for FBG is 70-100  mg/dl (3.9-5.6 mmol/L).  An A1c (hemoglobin A1c) blood test. This test provides information about blood glucose control over the previous 2?13months.  An oral glucose tolerance test (OGTT). This test measures your blood glucose twice: ? After fasting. This is your baseline level. ? Two hours after you drink a beverage that contains glucose.  You may be diagnosed with prediabetes:  If your FBG is 100?125 mg/dL (5.6-6.9 mmol/L).  If your A1c level is 5.7?6.4%.  If your OGGT result is 140?199 mg/dL (7.8-11 mmol/L).  These blood tests may be repeated to confirm your diagnosis. What happens if blood glucose is too high? The pancreas produces a hormone (insulin) that helps move glucose from the bloodstream into cells. When cells in the body do not respond properly to insulin that the body makes (insulin resistance), excess glucose builds up in the blood instead of going into cells. As a result, high blood glucose (hyperglycemia) can develop, which can cause many complications. This is a symptom of prediabetes. What can happen if blood glucose stays higher than normal for a long time? Having high blood glucose for a long time is dangerous. Too much glucose in your blood can damage your nerves and blood vessels. Long-term damage can lead to complications from diabetes, which may include:  Heart disease.  Stroke.  Blindness.  Kidney disease.  Depression.  Poor circulation in the feet and legs, which could lead to surgical removal (amputation) in severe cases.  How can prediabetes be prevented from turning into type 2 diabetes?  To help prevent type 2 diabetes, take the following actions:  Be physically active. ? Do moderate-intensity physical activity for  at least 30 minutes on at least 5 days of the week, or as much as told by your health care provider. This could be brisk walking, biking, or water aerobics. ? Ask your health care provider what activities are safe for you. A mix of  physical activities may be best, such as walking, swimming, cycling, and strength training.  Lose weight as told by your health care provider. ? Losing 5-7% of your body weight can reverse insulin resistance. ? Your health care provider can determine how much weight loss is best for you and can help you lose weight safely.  Follow a healthy meal plan. This includes eating lean proteins, complex carbohydrates, fresh fruits and vegetables, low-fat dairy products, and healthy fats. ? Follow instructions from your health care provider about eating or drinking restrictions. ? Make an appointment to see a diet and nutrition specialist (registered dietitian) to help you create a healthy eating plan that is right for you.  Do not smoke or use any tobacco products, such as cigarettes, chewing tobacco, and e-cigarettes. If you need help quitting, ask your health care provider.  Take over-the-counter and prescription medicines as told by your health care provider. You may be prescribed medicines that help lower the risk of type 2 diabetes.  This information is not intended to replace advice given to you by your health care provider. Make sure you discuss any questions you have with your health care provider. Document Released: 06/18/2015 Document Revised: 08/02/2015 Document Reviewed: 04/17/2015 Elsevier Interactive Patient Education  2018 [place].   Coronary Artery Bypass Grafting, Care After These instructions give you information on caring for yourself after your procedure. Your doctor may also give you more specific instructions. Call your doctor if you have any problems or questions after your procedure. Follow these instructions at home:  Only take medicine as told by your doctor. Take medicines exactly as told. Do not stop taking medicines or start any new medicines without talking to your doctor first.  Take your pulse as told by your doctor.  Do deep breathing as told by your doctor.  Use your breathing device (incentive spirometer), if given, to practice deep breathing several times a day. Support your chest with a pillow or your arms when you take deep breaths or cough.  Keep the area clean, dry, and protected where the surgery cuts (incisions) were made. Remove bandages (dressings) only as told by your doctor. If strips were applied to surgical area, do not take them off. They fall off on their own.  Check the surgery area daily for puffiness (swelling), redness, or leaking fluid.  If surgery cuts were made in your legs: ? Avoid crossing your legs. ? Avoid sitting for long periods of time. Change positions every 30 minutes. ? Raise your legs when you are sitting. Place them on pillows.  Wear stockings that help keep blood clots from forming in your legs (compression stockings).  Only take sponge baths until your doctor says it is okay to take showers. Pat the surgery area dry. Do not rub the surgery area with a washcloth or towel. Do not bathe, swim, or use a hot tub until your doctor says it is okay.  Eat foods that are high in fiber. These include raw fruits and vegetables, whole grains, beans, and nuts. Choose lean meats. Avoid canned, processed, and fried foods.  Drink enough fluids to keep your pee (urine) clear or pale yellow.  Weigh yourself every day.  Rest and limit activity  as told by your doctor. You may be told to: ? Stop any activity if you have chest pain, shortness of breath, changes in heartbeat, or dizziness. Get help right away if this happens. ? Move around often for short amounts of time or take short walks as told by your doctor. Gradually become more active. You may need help to strengthen your muscles and build endurance. ? Avoid lifting, pushing, or pulling anything heavier than 10 pounds (4.5 kg) for at least 6 weeks after surgery.  Do not drive until your doctor says it is okay.  Ask your doctor when you can go back to work.  Ask your  doctor when you can begin sexual activity again.  Follow up with your doctor as told. Contact a doctor if:  You have puffiness, redness, more pain, or fluid draining from the incision site.  You have a fever.  You have puffiness in your ankles or legs.  You have pain in your legs.  You gain 2 or more pounds (0.9 kg) a day.  You feel sick to your stomach (nauseous) or throw up (vomit).  You have watery poop (diarrhea). Get help right away if:  You have chest pain that goes to your jaw or arms.  You have shortness of breath.  You have a fast or irregular heartbeat.  You notice a "clicking" in your breastbone when you move.  You have numbness or weakness in your arms or legs.  You feel dizzy or light-headed. This information is not intended to replace advice given to you by your health care provider. Make sure you discuss any questions you have with your health care provider. Document Released: 03/01/2013 Document Revised: 08/02/2015 Document Reviewed: 08/03/2012 Elsevier Interactive Patient Education  2017 [employer].

## 2016-12-19 NOTE — Progress Notes (Signed)
Patient ID: DYMPHNA WADLEY, female    DOB: September 10, 1950, 66 y.o.   MRN: [id number]  PCP: Scot Jun, FNP  Chief Complaint  Patient presents with  . Establish Care  . Hospitalization Follow-up   Subjective:  HPI [name] is a 66 y.o. female presents to establish care and hospital follow-up. On 11/27/2016 [name] was admitted to [facility] with an acute ST elevation myocardial infarction. She underwent a CABG and for stent placement native grafts. Stents were placed is followed:  LIMA to LAD, SVG to Diagonal, SVG to OM2, and SVG to PDA.  She also suffered heart failure due to volume overload and was diuresed with Lasix . TEE echo showed a EF of 55-65% with normal systolic function and normal left ventricle function with moderate aortic insufficiency and mitral regurgitation. She was also found to be prediabetic with a hemoglobin A1c of 6.0. She is a current everyday smoker and has smoked over total 42 years, although she reports not smoking since being discharged from the hospital. She denies any persistent squeezing or short chest pain although notes that she is still having significant incisional pain. She reports still experiencing some shortness of breath and mostly sleeps in a recliner since being home. She has home health coming in at least twice per week to evaluate her surgical wounds. [name] reports consistently taking medications. She denies any side effects to her medication. She has no complaints today. She has a follow-up on 12/29/2016 with cardiology and on 01/06/2017 with cardiothoracic surgery. Social History   Social History  . Marital status: Unknown    Spouse name: N/A  . Number of children: N/A  . Years of education: N/A   Occupational History  . Not on file.   Social History Main Topics  . Smoking status: Current Every Day Smoker    Packs/day: 1.00    Years: 42.00    Types: Cigarettes  . Smokeless tobacco: Never Used  . Alcohol use No  .  Drug use: No  . Sexual activity: Not on file   Other Topics Concern  . Not on file   Social History Narrative  . No narrative on file    Family History  Problem Relation Age of Onset  . Heart disease Brother    Review of Systems  See history of present illness  Patient Active Problem List   Diagnosis Date Noted  . Mitral and aortic insufficiency: post-op TEE with Mod AI & Mild-Mod MR 12/18/2016  . Long-time heavy smoker 12/18/2016  . S/P CABG x 4 12/04/2016  . Acute myocardial infarction ([place])   . Acute ST elevation myocardial infarction (STEMI) of inferior wall ([place]) 11/27/2016  . Chronic diastolic heart failure ([place]) 11/27/2016  . Coronary artery disease involving native coronary artery of native heart with unstable angina pectoris ([place]) 11/27/2016  . Essential hypertension 11/27/2016  . Hyperlipidemia with target low density lipoprotein (LDL) cholesterol less than 70 mg/dL 11/27/2016    No Known Allergies  Prior to Admission medications   Medication Sig Start Date End Date Taking? Authorizing Provider  aspirin EC 325 MG EC tablet Take 1 tablet (325 mg total) by mouth daily. 12/09/16   Gold, Wayne E, PA-C  atorvastatin (LIPITOR) 80 MG tablet Take 1 tablet (80 mg total) by mouth daily at 6 PM. 12/08/16   Gold, Wilder Glade, PA-C  furosemide (LASIX) 40 MG tablet Take 1 tablet (40 mg total) by mouth daily. 12/16/16   Ivin Poot, MD  losartan (COZAAR) 25 MG tablet Take 3 tablets (75 mg total) by mouth daily. 12/09/16   Gold, Wilder Glade, PA-C  Metoprolol Tartrate 75 MG TABS Take 75 mg by mouth 2 (two) times daily. 12/08/16   Gold, Patrick Jupiter E, PA-C  oxyCODONE (OXY IR/ROXICODONE) 5 MG immediate release tablet Take 1-2 tablets (5-10 mg total) by mouth every 6 (six) hours as needed for severe pain. 12/08/16   Gold, Wayne E, PA-C  potassium chloride SA (K-DUR,KLOR-CON) 20 MEQ tablet Take 1 tablet (20 mEq total) by mouth daily. 12/16/16   Ivin Poot, MD    Past Medical, Surgical Family  and Social History reviewed and updated.    Objective:   Today's Vitals   12/19/16 1311  BP: 120/74  Pulse: 86  Resp: 16  Temp: 98.9 F (37.2 C)  TempSrc: Oral  SpO2: 98%  Weight: 196 lb (88.9 kg)  Height: 5\' 7"  (1.702 m)    Wt Readings from Last 3 Encounters:  12/08/16 203 lb 9.6 oz (92.4 kg)   Physical Exam  Constitutional: She is oriented to person, place, and time. She appears well-developed and well-nourished.  HENT:  Head: Normocephalic and atraumatic.  Eyes: Pupils are equal, round, and reactive to light. Conjunctivae and EOM are normal.  Neck: Normal range of motion. Neck supple.  Cardiovascular: Normal rate, regular rhythm, normal heart sounds and intact distal pulses.   Pulmonary/Chest: Effort normal and breath sounds normal.  Musculoskeletal: Normal range of motion.  Neurological: She is alert and oriented to person, place, and time.  Skin: Skin is warm and dry.  Mediastinal incision clean dry and intact  Psychiatric: She has a normal mood and affect. Her behavior is normal. Judgment and thought content normal.   Assessment & Plan:  1. S/P CABG x 4, Continue follow-up with cardiothoracic surgery. Continue to change dressing daily.  2. Long-time heavy smoker, reports current cessation. 3. Coronary artery disease due to calcified coronary lesion, continue furosemide, losartan, and metoprolol. 4. Hyperlipidemia, unspecified hyperlipidemia type, Atorvastatin 80 mg once daily 5. Prediabetes, A1C 6.0, will continue to monitor his A1c increases will start metformin.   [place] patient assistance application provided.  Orders Placed This Encounter  Procedures  . Flu Vaccine QUAD 6+ mos PF IM (Fluarix Quad PF)  . Tdap vaccine greater than or equal to 7yo IM  . CBC with Differential  . Lipid panel  . COMPLETE METABOLIC PANEL WITH GFR  . Brain natriuretic peptide  . POCT urinalysis dip (device)    Return for care 2 months for chronic disease  management.  Carroll Sage. Kenton Kingfisher, MSN, FNP-C [facility]  [address] [place], [place] [address] [phone]

## 2016-12-20 LAB — BRAIN NATRIURETIC PEPTIDE: BRAIN NATRIURETIC PEPTIDE: 183 pg/mL — AB (ref <100)

## 2016-12-21 LAB — POCT URINALYSIS DIP (DEVICE)
Bilirubin Urine: NEGATIVE
Glucose, UA: NEGATIVE mg/dL
Ketones, ur: NEGATIVE mg/dL
Nitrite: NEGATIVE
PH: 5.5 (ref 5.0–8.0)
PROTEIN: NEGATIVE mg/dL
SPECIFIC GRAVITY, URINE: 1.015 (ref 1.005–1.030)
UROBILINOGEN UA: 1 mg/dL (ref 0.0–1.0)

## 2016-12-29 ENCOUNTER — Ambulatory Visit (INDEPENDENT_AMBULATORY_CARE_PROVIDER_SITE_OTHER): Payer: Self-pay | Admitting: Nurse Practitioner

## 2016-12-29 ENCOUNTER — Encounter: Payer: Self-pay | Admitting: Nurse Practitioner

## 2016-12-29 VITALS — BP 140/88 | HR 82 | Ht 67.0 in | Wt 199.5 lb

## 2016-12-29 DIAGNOSIS — R601 Generalized edema: Secondary | ICD-10-CM

## 2016-12-29 DIAGNOSIS — Z951 Presence of aortocoronary bypass graft: Secondary | ICD-10-CM

## 2016-12-29 DIAGNOSIS — Z720 Tobacco use: Secondary | ICD-10-CM

## 2016-12-29 DIAGNOSIS — I259 Chronic ischemic heart disease, unspecified: Secondary | ICD-10-CM

## 2016-12-29 DIAGNOSIS — E7849 Other hyperlipidemia: Secondary | ICD-10-CM

## 2016-12-29 LAB — HEPATIC FUNCTION PANEL
ALT: 12 IU/L (ref 0–32)
AST: 10 IU/L (ref 0–40)
Albumin: 4.2 g/dL (ref 3.6–4.8)
Alkaline Phosphatase: 130 IU/L — ABNORMAL HIGH (ref 39–117)
Bilirubin Total: 0.5 mg/dL (ref 0.0–1.2)
Bilirubin, Direct: 0.17 mg/dL (ref 0.00–0.40)
Total Protein: 7.9 g/dL (ref 6.0–8.5)

## 2016-12-29 LAB — CBC
Hematocrit: 34.9 % (ref 34.0–46.6)
Hemoglobin: 11.4 g/dL (ref 11.1–15.9)
MCH: 27.1 pg (ref 26.6–33.0)
MCHC: 32.7 g/dL (ref 31.5–35.7)
MCV: 83 fL (ref 79–97)
Platelets: 406 10*3/uL — ABNORMAL HIGH (ref 150–379)
RBC: 4.2 x10E6/uL (ref 3.77–5.28)
RDW: 14.7 % (ref 12.3–15.4)
WBC: 12.4 10*3/uL — ABNORMAL HIGH (ref 3.4–10.8)

## 2016-12-29 LAB — LIPID PANEL
Chol/HDL Ratio: 2.9 ratio (ref 0.0–4.4)
Cholesterol, Total: 117 mg/dL (ref 100–199)
HDL: 41 mg/dL (ref >39)
LDL Calculated: 54 mg/dL (ref 0–99)
Triglycerides: 108 mg/dL (ref 0–149)
VLDL Cholesterol Cal: 22 mg/dL (ref 5–40)

## 2016-12-29 LAB — BASIC METABOLIC PANEL
BUN/Creatinine Ratio: 16 (ref 12–28)
BUN: 16 mg/dL (ref 8–27)
CO2: 24 mmol/L (ref 20–29)
Calcium: 10.2 mg/dL (ref 8.7–10.3)
Chloride: 98 mmol/L (ref 96–106)
Creatinine, Ser: 1.01 mg/dL — ABNORMAL HIGH (ref 0.57–1.00)
GFR calc Af Amer: 68 mL/min/{1.73_m2} (ref >59)
GFR calc non Af Amer: 59 mL/min/{1.73_m2} — ABNORMAL LOW (ref >59)
Glucose: 117 mg/dL — ABNORMAL HIGH (ref 65–99)
Potassium: 4.2 mmol/L (ref 3.5–5.2)
Sodium: 141 mmol/L (ref 134–144)

## 2016-12-29 MED ORDER — METOPROLOL TARTRATE 75 MG PO TABS: 75.0000 mg | ORAL_TABLET | Freq: Two times a day (BID) | ORAL | 11 refills | Status: DC

## 2016-12-29 MED ORDER — ATORVASTATIN CALCIUM 80 MG PO TABS: 80.0000 mg | ORAL_TABLET | Freq: Every day | ORAL | 11 refills | Status: DC

## 2016-12-29 MED ORDER — FUROSEMIDE 40 MG PO TABS: 40.0000 mg | ORAL_TABLET | Freq: Every day | ORAL | 0 refills | Status: DC

## 2016-12-29 MED ORDER — LOSARTAN POTASSIUM 100 MG PO TABS: 100.0000 mg | ORAL_TABLET | Freq: Every day | ORAL | 11 refills | Status: DC

## 2016-12-29 MED ORDER — POTASSIUM CHLORIDE CRYS ER 20 MEQ PO TBCR: 20.0000 meq | EXTENDED_RELEASE_TABLET | Freq: Every day | ORAL | 0 refills | Status: DC

## 2016-12-29 NOTE — Patient Instructions (Addendum)
We will be checking the following labs today - BMET, HPF, lipids and CBC   Medication Instructions:    Continue with your current medicines. BUT  I am changing your Losartan to 100 mg a day - this is at the drug store  I refilled your medicines.   I want you to continue with your Lasix and potassium for now.     Testing/Procedures To Be Arranged:  N/A  Follow-Up:   See one of the physicians in the [facility] office - Dr. Bronson Ing - in 2 to 3 weeks - Tuesday January 13, 2018 at 1:40 pm.  Please arrive at least 10 min prior to appointment time to ensure adequate time for new patient registration.     Other Special Instructions:   Congrats for not smoking.   Try to start increasing your activity    If you need a refill on your cardiac medications before your next appointment, please call your pharmacy.   Call the [place] office at [phone] if you have any questions, problems or concerns.

## 2016-12-29 NOTE — Progress Notes (Signed)
CARDIOLOGY OFFICE NOTE  Date:  12/29/2016    Sherren Kerns Date of Birth: [birth year] Medical Record #[id number]  PCP:  Scot Jun, FNP  Cardiologist:  Ellyn Hack (NEW)  Chief Complaint  Patient presents with  . Coronary Artery Disease    Post CABG - seen for Dr. Ellyn Hack (NEW)    History of Present Illness: [name] is a 66 y.o. female who presents today for a post hospital visit. Seen for Dr. Ellyn Hack.   She has a history of HTN, tobacco abuse and HLD.   Admitted last month with chest pain - EMS activated - EKG with inferior ST elevation.    She was placed on Heparin, Nitroglycerin and aggrastat drips.  She had no chest pain during admission.  She was found to have a UTI and was placed on Rocephin.  She was taken for cardiac catheterization which showed multivessel CAD with a preserved EF.  It was felt coronary bypass grating would be indicated and [facility] consult was obtained.  She was evaluated by Dr. Roxan Hockey who did CABG x 4 utilizing LIMA to LAD, SVG to Diagonal, SVG to OM2, and SVG to PDA.  Post op course was complicated by low output requiring Dopamine. Also needed aggressive pulmonary toliet. Treated for volume overload as well.   SNF was recommended - however she does not have insurance - therefore home health was arranged.  Comes in today. Here with son [name]. She is pretty adamant about getting established in the [place] office - closer to her home.  Has been home almost a month. Can't lie flat. Still with surgical pain. Has her entire incision covered in bandages. Getting lots of outside food. Not smoking.  Using some pain medicine still. Walking with a walker. Still with some swelling.  Can't sleep well. Basically sitting and watching TV all day. Very little walking. She has not gotten in the shower yet - wants "someone with me". Not dizzy or lightheaded. BP up some.   Past Medical History:  Diagnosis Date  . CAD, multiple vessel 11/27/2016   pRCA  60% &55%, dRCA 95% & 90% (very tortuous); ost LAD 65%, mLAD 90%, Ost D1 50% &60%, OM2 60%.  EF 55-65%, High LVEDP 32-35%, Mod (3+) MR  . Chronic diastolic heart failure ([place]) 11/27/2016  . Colon polyps   . Essential hypertension 11/27/2016  . Hx of CABG 12/01/2016   LIMA-LAD, SVG-D1, SVG-OM2, SVG-rPDA  . Hyperlipidemia with target low density lipoprotein (LDL) cholesterol less than 70 mg/dL 11/27/2016  . Long-time heavy smoker    1 PPD x >40 yr  . ST elevation myocardial infarction (STEMI) of inferior wall ([place]) 11/27/2016   - Aborted STEMI - EKG STE resolved after ASA & Heparin; MV CAD    Past Surgical History:  Procedure Laterality Date  . ABDOMINAL HYSTERECTOMY    . CORONARY ARTERY BYPASS GRAFT N/A 12/01/2016   Procedure: CORONARY ARTERY BYPASS GRAFTING (CABG) x four , using left internal mammary artery and right leg greater saphenous vein harvested endoscopically - LIMA to LAD, -SVG to DIAG1, SVG to OM2, SVG to PDA ;  Surgeon: Melrose Nakayama, MD;  Location: [facility];  Service: Open Heart Surgery;  Laterality: N/A;  . INTRAOPERATIVE TRANSESOPHAGEAL ECHOCARDIOGRAM N/A 12/01/2016   Procedure: INTRAOPERATIVE TRANSESOPHAGEAL ECHOCARDIOGRAM;  Surgeon: Melrose Nakayama, MD;  Location: [facility] OR;  Service: Open Heart Surgery: EF 55-65%. No RWMA. MIld AI-- (post CABG, EF ~50%, Mod AI. MIld MR  . LEFT HEART  CATH AND CORONARY ANGIOGRAPHY N/A 11/27/2016   Procedure: LEFT HEART CATH AND CORONARY ANGIOGRAPHY;  Surgeon: Leonie Man, MD;  Location: [facility] IN[facility]AS[facility] CV LAB: pRCA 60% &55%, dRCA 95% & 90% (very tortuous); ost LAD 65%, mLAD 90%, Ost D1 50% &60%, OM2 60%.  EF 55-65%, High LVEDP 32-35%, Mod (3+) MR  . TRANSTHORACIC ECHOCARDIOGRAM  11/27/2016   EF 60-65% but no RWMA. Diastolic dysfunction noted but not calculated     Medications: Current Meds  Medication Sig  . aspirin EC 325 MG EC tablet Take 1 tablet (325 mg total) by mouth daily.  Marland Kitchen atorvastatin (LIPITOR) 80 MG tablet Take 1 tablet (80  mg total) by mouth daily at 6 PM.  . furosemide (LASIX) 40 MG tablet Take 1 tablet (40 mg total) by mouth daily.  . Metoprolol Tartrate 75 MG TABS Take 75 mg by mouth 2 (two) times daily.  Marland Kitchen oxyCODONE (OXY IR/ROXICODONE) 5 MG immediate release tablet Take 1-2 tablets (5-10 mg total) by mouth every 6 (six) hours as needed for severe pain.  . potassium chloride SA (K-DUR,KLOR-CON) 20 MEQ tablet Take 1 tablet (20 mEq total) by mouth daily.  . [DISCONTINUED] atorvastatin (LIPITOR) 80 MG tablet Take 1 tablet (80 mg total) by mouth daily at 6 PM.  . [DISCONTINUED] furosemide (LASIX) 40 MG tablet Take 1 tablet (40 mg total) by mouth daily.  . [DISCONTINUED] losartan (COZAAR) 25 MG tablet Take 3 tablets (75 mg total) by mouth daily.  . [DISCONTINUED] Metoprolol Tartrate 75 MG TABS Take 75 mg by mouth 2 (two) times daily.  . [DISCONTINUED] potassium chloride SA (K-DUR,KLOR-CON) 20 MEQ tablet Take 1 tablet (20 mEq total) by mouth daily.     Allergies: No Known Allergies  Social History: The patient  reports that she has been smoking Cigarettes.  She has a 42.00 pack-year smoking history. She has never used smokeless tobacco. She reports that she does not drink alcohol or use drugs.   Family History: The patient's family history includes Heart disease in her brother.   Review of Systems: Please see the history of present illness.   Otherwise, the review of systems is positive for none.   All other systems are reviewed and negative.   Physical Exam: VS:  BP 140/88   Pulse 82   Ht 5\' 7"  (1.702 m)   Wt 199 lb 8 oz (90.5 kg)   SpO2 96%   BMI 31.25 kg/m  .  BMI Body mass index is 31.25 kg/m.  Wt Readings from Last 3 Encounters:  12/29/16 199 lb 8 oz (90.5 kg)  12/19/16 196 lb (88.9 kg)  12/08/16 203 lb 9.6 oz (92.4 kg)    General: Alert. Looks much older than her stated age. Alert and in no acute distress.   HEENT: Normal.  Neck: Supple, no JVD, carotid bruits, or masses noted.  Cardiac:  Regular rate and rhythm. No murmurs, rubs, or gallops. +1 edema. Bandages removed from her sternum - sternum looks fine. She has some superficial abrasions from her chest tubes - clean dressing applied after steri strips removed.  Respiratory:  Lungs with decreased breath sounds but with normal work of breathing.  GI: Soft and nontender.  MS: No deformity or atrophy. Gait and ROM intact. Using a walker.  Skin: Warm and dry. Color is normal.  Neuro:  Strength and sensation are intact and no gross focal deficits noted.  Psych: Alert, appropriate and with normal affect.   LABORATORY DATA:  EKG:  EKG is  ordered today. This demonstrates NSR with RBBB, inferior Q's, poor R wave progression, evolving changes.  Lab Results  Component Value Date   WBC 10.3 12/19/2016   HGB 10.6 (L) 12/19/2016   HCT 33.4 (L) 12/19/2016   PLT 498 (H) 12/19/2016   GLUCOSE 101 (H) 12/19/2016   CHOL 173 11/27/2016   TRIG 100 11/27/2016   HDL 46 11/27/2016   LDLCALC 107 (H) 11/27/2016   ALT 13 12/19/2016   AST 15 12/19/2016   NA 140 12/19/2016   K 4.7 12/19/2016   CL 102 12/19/2016   CREATININE 0.89 12/19/2016   BUN 15 12/19/2016   CO2 30 12/19/2016   INR 1.47 12/01/2016   HGBA1C 6.0 (H) 12/02/2016     BNP (last 3 results)  Recent Labs  12/19/16 1328  BNP 183*    ProBNP (last 3 results) No results for input(s): PROBNP in the last 8760 hours.   Other Studies Reviewed Today:  Significant Diagnostic Studies: angiography:    Prox RCA-2 lesion, 60 %stenosed. Prox-mid RCA-1 lesion, 55 %stenosed.  Dist RCA-2 lesion, 95 %stenosed. Dist RCA-1 lesion, 90 %stenosed. - This combination is likely culprit lesion, however with very tortuous RCA in a stable patient did not feel it was a good idea to attempt PTCA at this time.  Ost LAD lesion, 65 %stenosed. Mid LAD lesion, 90 %stenosed.  Ost 1st Diag to 1st Diag lesion, 50 %stenosed. 1st Diag lesion, 60 %stenosed.  Dist LAD lesion, 50 %stenosed. - This  is beyond the 2nd Diag branch.  2nd Mrg lesion, 60 %stenosed.  The left ventricular systolic function is normal. The left ventricular ejection fraction is 55-65% by visual estimate.  LV end diastolic pressure is severely elevated. 32-35 mmhg  There is moderate (3+) mitral regurgitation.  Treatments: surgery:   Median sternotomy, extracorporeal circulation, coronary artery bypass grafting x4 (left internal mammary artery to left anterior descending, saphenous vein graft to 1st diagonal, saphenous vein graft to obtuse marginal 2, saphenous vein graft to posterior descending), and endoscopic vein harvest of right leg.  Pre CABG Doppler Summary: Bilateral: mild mixed plaque origin ICA. Tortuous vessels. 1-39% ICA plaquing. Vertebral artery flow is antegrade.   Prepared and Electronically Authenticated by  Leia Alf, MD 2018-09-23T19:11:59   Echo Study Conclusions 11/2016  - Left ventricle: The cavity size was normal. Systolic function was   normal. The estimated ejection fraction was in the range of 60%   to 65%. Wall motion was normal; there were no regional wall   motion abnormalities. Doppler parameters are consistent with both   elevated ventricular end-diastolic filling pressure and elevated   left atrial filling pressure. - Aortic valve: There was mild regurgitation. - Mitral valve: There was mild regurgitation. - Right ventricle: The cavity size was mildly dilated. Wall   thickness was normal. - Right atrium: The atrium was mildly dilated. - Atrial septum: No defect or patent foramen ovale was identified.    Assessment/Plan: 1. CAD with inferior STEMI - s/p CABG - very slow progress. Still with some volume overload which I suspect has been exacerbated by her poor dietary choices. Continue Lasix and potassium for now. Lab today. Would hold on cardiac rehab. Will get her established in the [place] office. Clean dressing placed over the chest tube sites.  Encouraged to use hydrogen peroxide/rinse and apply topical antibiotic ointment. Encouraged her to keep her follow up at [facility] as planned.    2. Tobacco abuse - not smoking. Encouraged to not resume.  3. HLD - on high dose statin - will need lab rechecked today  4. 1 to 39% bilateral ICA disease - will need to be followed going forward.   Current medicines are reviewed with the patient today.  The patient does not have concerns regarding medicines other than what has been noted above.  The following changes have been made:  See above.  Labs/ tests ordered today include:    Orders Placed This Encounter  Procedures  . Basic metabolic panel  . CBC  . Hepatic function panel  . Lipid panel  . EKG 12-Lead     Disposition:   Will get her seen in the [place] office in a few weeks.   Patient is agreeable to this plan and will call if any problems develop in the interim.   SignedTruitt Merle, NP  12/29/2016 11:27 AM  [facility] [address] [place], [place]  [address] Phone: [phone] Fax: [phone]

## 2016-12-30 ENCOUNTER — Other Ambulatory Visit: Payer: Self-pay

## 2016-12-30 ENCOUNTER — Telehealth: Payer: Self-pay

## 2016-12-30 MED ORDER — METOPROLOL TARTRATE 25 MG PO TABS: 75.0000 mg | ORAL_TABLET | Freq: Two times a day (BID) | ORAL | 0 refills | Status: DC

## 2016-12-30 NOTE — Telephone Encounter (Signed)
Burtis Junes, NP  to Me     12/30/16 1:31 PM  Note    Drevion Offord,   Andee Poles is out for the next 2 weeks.   Ok to use the 25 mg tablet - taking 3 tablets BID.     Me  to Tamsen Snider       12/30/16 1:09 PM  [facility] [phone] sent in fax stating that Metoprolol tartrate 75 mg is on manufacturer back order and [facility] would like to know if ok to change to 25 mg 3 tabs BID or want to send in alternative medication?

## 2016-12-30 NOTE — Telephone Encounter (Signed)
Candice,   Andee Poles is out for the next 2 weeks.   Ok to use the 25 mg tablet - taking 3 tablets BID.

## 2017-01-02 ENCOUNTER — Other Ambulatory Visit: Payer: Self-pay | Admitting: Thoracic Surgery (Cardiothoracic Vascular Surgery)

## 2017-01-02 DIAGNOSIS — Z951 Presence of aortocoronary bypass graft: Secondary | ICD-10-CM

## 2017-01-05 ENCOUNTER — Ambulatory Visit
Admission: RE | Admit: 2017-01-05 | Discharge: 2017-01-05 | Disposition: A | Discharge status: Home / Self Care | Payer: Self-pay | Source: Ambulatory Visit | Attending: Thoracic Surgery (Cardiothoracic Vascular Surgery) | Admitting: Thoracic Surgery (Cardiothoracic Vascular Surgery)

## 2017-01-05 ENCOUNTER — Ambulatory Visit (INDEPENDENT_AMBULATORY_CARE_PROVIDER_SITE_OTHER): Payer: Self-pay | Admitting: Physician Assistant

## 2017-01-05 VITALS — BP 139/90 | HR 82 | Resp 20 | Ht 67.0 in | Wt 196.0 lb

## 2017-01-05 DIAGNOSIS — Z951 Presence of aortocoronary bypass graft: Secondary | ICD-10-CM

## 2017-01-05 NOTE — Progress Notes (Signed)
HPI: Patient returns for routine postoperative follow-up having undergone CABG x 4 on 12/01/2016. The patient's early postoperative recovery while in the hospital was notable for UTI which she completed course of ABX for.  She had CHF exacerbation and was treated aggressively with lasix.  She was deconditioned, but without insurance she was unable to be placed at [facility].  Since hospital discharge the patient reports she is doing okay.  She was evaluated by Truitt Merle NP on 12/29/2016 at which time patient was not ambulating much.  Her dietary intake was poor and she was experiencing a lot of edema due to this.  She was given lasix at that time and instructed to increase her ambulation.  Currently the patient states that she has increased her ambulation.  She is ambulating around her apartment, and goes to her mailbox daily.  She states she lives alone and has been hesitant to do much more than that as she does not have help.  She states her swelling has improved.  Her incisions are healing without incidence.  She continues to have issues with laying flat and this is due to patient struggling to get back up.  She denies this is due to shortness of breath.    Current Outpatient Prescriptions  Medication Sig Dispense Refill  . aspirin EC 325 MG EC tablet Take 1 tablet (325 mg total) by mouth daily.    Marland Kitchen atorvastatin (LIPITOR) 80 MG tablet Take 1 tablet (80 mg total) by mouth daily at 6 PM. 30 tablet 11  . furosemide (LASIX) 40 MG tablet Take 1 tablet (40 mg total) by mouth daily. 14 tablet 0  . losartan (COZAAR) 100 MG tablet Take 1 tablet (100 mg total) by mouth daily. 30 tablet 11  . metoprolol tartrate (LOPRESSOR) 25 MG tablet Take 3 tablets (75 mg total) by mouth 2 (two) times daily. 180 tablet 0  . oxyCODONE (OXY IR/ROXICODONE) 5 MG immediate release tablet Take 1-2 tablets (5-10 mg total) by mouth every 6 (six) hours as needed for severe pain. 30 tablet 0  . potassium chloride SA (K-DUR,KLOR-CON) 20  MEQ tablet Take 1 tablet (20 mEq total) by mouth daily. 14 tablet 0   No current facility-administered medications for this visit.     Physical Exam:  BP 139/90   Pulse 82   Resp 20   Ht 5\' 7"  (1.702 m)   Wt 196 lb (88.9 kg)   SpO2 98% Comment: RA  BMI 30.70 kg/m   Gen: no apparent distress, patient looks good Heart; RRR Lungs: CTA bilaterally Abd: soft non-tender, non-distended Ext: no edema present Incisions- healing well, chest tube incisions look good  Diagnostic Tests:  CXR: some atelectasis, minimal pleural effusions  A/P:  1. CV- hemodynamically stable S/P CABG- would continue current medications, including Lasix... She will follow up in [place] in a few weeks 2. Incisions- healing well, chest tube sites look good patient instructed to continue wound care 3. Diet- patient again educated on importance of good dietary intake.. She requested RX for ensure which was provided 4. Activity- patient needs to ambulate more, she has improved some since her visit with Cardiology office.  She was instructed on the importance of ambulating to increase her endurance level...she was given RX for PT which she would like to start if she is able to afford.. She is hoping to get medicare/medicaid, possibly disability to help  5. Dispo- patient stable, she looks good today, no swelling on exam, continue lasix, given instruction on  importance of increasing activity level... 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

## 2017-01-06 ENCOUNTER — Encounter: Payer: Self-pay | Admitting: Thoracic Surgery (Cardiothoracic Vascular Surgery)

## 2017-01-06 ENCOUNTER — Ambulatory Visit: Payer: Self-pay | Admitting: Cardiovascular Disease

## 2017-01-07 ENCOUNTER — Other Ambulatory Visit: Payer: Self-pay | Admitting: Nurse Practitioner

## 2017-01-07 ENCOUNTER — Other Ambulatory Visit: Payer: Self-pay | Admitting: Cardiothoracic Surgery

## 2017-01-07 DIAGNOSIS — R601 Generalized edema: Secondary | ICD-10-CM

## 2017-01-08 NOTE — Telephone Encounter (Signed)
We need to see if she is still taking Lasix before refilling the potassium.   Ok to refill both.  Her follow up will be in [place].   Thanks Cecille Rubin

## 2017-01-12 NOTE — Telephone Encounter (Signed)
REFILL 

## 2017-01-13 ENCOUNTER — Encounter: Payer: Self-pay | Admitting: Cardiovascular Disease

## 2017-01-13 ENCOUNTER — Ambulatory Visit (INDEPENDENT_AMBULATORY_CARE_PROVIDER_SITE_OTHER): Payer: Self-pay | Admitting: Cardiovascular Disease

## 2017-01-13 VITALS — BP 122/78 | HR 68 | Ht 67.0 in | Wt 198.0 lb

## 2017-01-13 DIAGNOSIS — Z9289 Personal history of other medical treatment: Secondary | ICD-10-CM

## 2017-01-13 DIAGNOSIS — Z720 Tobacco use: Secondary | ICD-10-CM

## 2017-01-13 DIAGNOSIS — R600 Localized edema: Secondary | ICD-10-CM

## 2017-01-13 DIAGNOSIS — E7849 Other hyperlipidemia: Secondary | ICD-10-CM

## 2017-01-13 DIAGNOSIS — Z951 Presence of aortocoronary bypass graft: Secondary | ICD-10-CM

## 2017-01-13 DIAGNOSIS — I25708 Atherosclerosis of coronary artery bypass graft(s), unspecified, with other forms of angina pectoris: Secondary | ICD-10-CM

## 2017-01-13 NOTE — Patient Instructions (Signed)
Medication Instructions:  Continue all current medications.  Labwork: none  Testing/Procedures: none  Follow-Up: 3 months   Any Other Special Instructions Will Be Listed Below (If Applicable).  If you need a refill on your cardiac medications before your next appointment, please call your pharmacy.  

## 2017-01-13 NOTE — Progress Notes (Signed)
SUBJECTIVE: The patient presents to establish care in our [place] office.  She was diagnosed with an inferior ST elevation myocardial infarction in September 2018.  She had multivessel CAD and ultimately underwent four-vessel CABG with a LIMA to LAD, SVG to Diagonal, SVG to OM2, and SVG to PDA.   Echocardiogram demonstrated normal left ventricular systolic function and regional wall motion, LVEF 60-65%, elevated filling pressures, and mild aortic and mitral regurgitation.  She had some mild plaque disease in the internal carotid arteries.  Lipids 12/29/16: Total cholesterol 117, triglycerides 108, HDL 41, LDL 54.  She was unable to afford cardiac rehab.  She has changed her diet considerably and is now eating baked chips and low-fat foods including 2% milk.  She denies chest pain, palpitations, leg swelling, shortness of breath.  She has been sleeping in a recliner.     Review of Systems: As per "subjective", otherwise negative.  No Known Allergies  Current Outpatient Medications  Medication Sig Dispense Refill  . aspirin EC 325 MG EC tablet Take 1 tablet (325 mg total) by mouth daily.    Marland Kitchen atorvastatin (LIPITOR) 80 MG tablet Take 1 tablet (80 mg total) by mouth daily at 6 PM. 30 tablet 11  . furosemide (LASIX) 40 MG tablet TAKE 1 TABLET BY MOUTH ONCE DAILY 30 tablet 3  . KLOR-CON M20 20 MEQ tablet TAKE 1 TABLET BY MOUTH ONCE DAILY 90 tablet 1  . losartan (COZAAR) 100 MG tablet Take 1 tablet (100 mg total) by mouth daily. 30 tablet 11  . metoprolol tartrate (LOPRESSOR) 25 MG tablet Take 3 tablets (75 mg total) by mouth 2 (two) times daily. 180 tablet 0  . oxyCODONE (OXY IR/ROXICODONE) 5 MG immediate release tablet Take 1-2 tablets (5-10 mg total) by mouth every 6 (six) hours as needed for severe pain. 30 tablet 0  . potassium chloride SA (K-DUR,KLOR-CON) 20 MEQ tablet Take 1 tablet (20 mEq total) by mouth daily. 14 tablet 0   No current facility-administered medications for this  visit.     Past Medical History:  Diagnosis Date  . CAD, multiple vessel 11/27/2016   pRCA 60% &55%, dRCA 95% & 90% (very tortuous); ost LAD 65%, mLAD 90%, Ost D1 50% &60%, OM2 60%.  EF 55-65%, High LVEDP 32-35%, Mod (3+) MR  . Chronic diastolic heart failure ([place]) 11/27/2016  . Colon polyps   . Essential hypertension 11/27/2016  . Hx of CABG 12/01/2016   LIMA-LAD, SVG-D1, SVG-OM2, SVG-rPDA  . Hyperlipidemia with target low density lipoprotein (LDL) cholesterol less than 70 mg/dL 11/27/2016  . Long-time heavy smoker    1 PPD x >40 yr  . ST elevation myocardial infarction (STEMI) of inferior wall ([place]) 11/27/2016   - Aborted STEMI - EKG STE resolved after ASA & Heparin; MV CAD    Past Surgical History:  Procedure Laterality Date  . ABDOMINAL HYSTERECTOMY    . TRANSTHORACIC ECHOCARDIOGRAM  11/27/2016   EF 60-65% but no RWMA. Diastolic dysfunction noted but not calculated    Social History   Socioeconomic History  . Marital status: Unknown    Spouse name: Not on file  . Number of children: Not on file  . Years of education: Not on file  . Highest education level: Not on file  Social Needs  . Financial resource strain: Not on file  . Food insecurity - worry: Not on file  . Food insecurity - inability: Not on file  . Transportation needs - medical: Not  on file  . Transportation needs - non-medical: Not on file  Occupational History  . Not on file  Tobacco Use  . Smoking status: Former Smoker    Packs/day: 1.00    Years: 42.00    Pack years: 42.00    Types: Cigarettes    Last attempt to quit: 01/07/2017    Years since quitting: 0.0  . Smokeless tobacco: Never Used  Substance and Sexual Activity  . Alcohol use: No  . Drug use: No  . Sexual activity: Not on file  Other Topics Concern  . Not on file  Social History Narrative  . Not on file     Vitals:   01/13/17 1314  BP: 122/78  Pulse: 68  SpO2: 97%  Weight: 198 lb (89.8 kg)  Height: 5\' 7"  (1.702 m)    Wt  Readings from Last 3 Encounters:  01/13/17 198 lb (89.8 kg)  01/05/17 196 lb (88.9 kg)  12/29/16 199 lb 8 oz (90.5 kg)     PHYSICAL EXAM General: NAD HEENT: Normal. Neck: No JVD, no thyromegaly. Lungs: Clear to auscultation bilaterally with normal respiratory effort. CV: Regular rate and rhythm, normal S1/S2, no S3/S4, no murmur. No pretibial or periankle edema.  No carotid bruit.   Abdomen: Soft, nontender, no distention.  Neurologic: Alert and oriented.  Psych: Normal affect. Skin: Normal. Musculoskeletal: No gross deformities.    ECG: Most recent ECG reviewed.   Labs: Lab Results  Component Value Date/Time   K 4.2 12/29/2016 11:40 AM   BUN 16 12/29/2016 11:40 AM   CREATININE 1.01 (H) 12/29/2016 11:40 AM   CREATININE 0.89 12/19/2016 01:28 PM   ALT 12 12/29/2016 11:40 AM   HGB 11.4 12/29/2016 11:40 AM     Lipids: Lab Results  Component Value Date/Time   LDLCALC 54 12/29/2016 11:40 AM   CHOL 117 12/29/2016 11:40 AM   TRIG 108 12/29/2016 11:40 AM   HDL 41 12/29/2016 11:40 AM       ASSESSMENT AND PLAN: 1.  Coronary artery disease with inferior STEMI status post four-vessel CABG: Symptomatically stable.  Continue aspirin, Lipitor, losartan, and Toprol-XL.  Increased levels of physical activity and continue dietary modification were encouraged.  2.  Hyperlipidemia: Lipids reviewed above.  They are well controlled.  Continue high intensity statin therapy with Lipitor 80 mg daily.  3.  Bilateral lower extremity edema: Symptomatically stable on Lasix 40 mg daily with supplemental potassium.  Low-sodium diet was encouraged.  Disposition: Follow up 3 months.  Time spent: 40 minutes, of which greater than 50% was spent reviewing symptoms, relevant blood tests and studies, and discussing management plan with the patient.    Kate Sable, M.D., F.A.C.C.

## 2017-02-02 ENCOUNTER — Ambulatory Visit: Payer: Self-pay | Admitting: Family Medicine

## 2017-03-12 ENCOUNTER — Ambulatory Visit: Payer: Self-pay | Admitting: Family Medicine

## 2017-03-24 DIAGNOSIS — Z736 Limitation of activities due to disability: Secondary | ICD-10-CM

## 2017-04-21 ENCOUNTER — Ambulatory Visit: Payer: Self-pay | Admitting: Cardiovascular Disease

## 2017-04-22 ENCOUNTER — Encounter: Payer: Self-pay | Admitting: Cardiovascular Disease

## 2017-05-11 ENCOUNTER — Telehealth: Payer: Self-pay

## 2017-05-11 NOTE — Telephone Encounter (Signed)
[name] called stating that she has a high temperature and runny nose.  She is s/p CABG x4 12/01/2016 to which she has been released and can come back as needed.  She stated that her incision sites look well.  I advised her that she needed to contact her PCP as she was wanting a prescription.  I did tell her that she could use over-the-counter medications and tylenol /ibuprofen as directed on bottle.  She acknowledged receipt. However, I told her that she needed to get check out because of the high fever.  She stated that she did not have a PCP to which I advised her to go to the nearest Urgent Care.  She stated that she would just go to the ER.

## 2017-05-11 NOTE — Telephone Encounter (Signed)
Patient contacted office stating that she was running a high fever with nasal drainage for the past few days. Patient stated she wanted Dr. Bronson Ing to write her a prescription because she was miserable. Advised patient that Dr. Bronson Ing is her cardiologist and would be happy to assist with cardiac issues but this was something she needs to discuss with her PCP. Patient was very adamant she was not calling anyone else and Dr. Bronson Ing needed to write her an antibiotic. Advised patient no Dr. Lynnda Child be able to just write an antibiotic due to not knowing what was causing her fever. Advised patient if she did not have a PCP she needed to go to the ER or urgent care. Patient did not respond to this statement but did state she would reschedule her visit she no showed for. Appt rescheduled for 4/1. Will send to provider as FYI.

## 2017-05-12 ENCOUNTER — Telehealth: Payer: Self-pay | Admitting: Cardiovascular Disease

## 2017-05-12 NOTE — Telephone Encounter (Signed)
LMTCB

## 2017-05-12 NOTE — Telephone Encounter (Signed)
losartan (COZAAR) 100 MG tablet   Patient states medication has been recalled wants new medication called into [facility] in [place]

## 2017-05-12 NOTE — Telephone Encounter (Signed)
Patient returned call to Kailey Joyce  °

## 2017-05-13 ENCOUNTER — Other Ambulatory Visit: Payer: Self-pay

## 2017-05-13 MED ORDER — AZILSARTAN MEDOXOMIL 40 MG PO TABS: 40.0000 mg | ORAL_TABLET | Freq: Every day | ORAL | 0 refills | Status: DC

## 2017-05-13 NOTE — Telephone Encounter (Signed)
Can take Edarbi 40 mg daily. Contact rep for discount details.

## 2017-05-13 NOTE — Telephone Encounter (Signed)
Losartan has been recalled [facility] pharmacy does not know when they will have it again. Patient does not want to go to a different pharmacy and wants a different medication called in.

## 2017-05-13 NOTE — Telephone Encounter (Signed)
Rx sent. Voucher card available for pick up

## 2017-05-20 ENCOUNTER — Telehealth: Payer: Self-pay | Admitting: Cardiovascular Disease

## 2017-05-20 MED ORDER — AZILSARTAN MEDOXOMIL 40 MG PO TABS: 40.0000 mg | ORAL_TABLET | Freq: Every day | ORAL | 0 refills | Status: DC

## 2017-05-20 NOTE — Telephone Encounter (Signed)
Patient called stating that [place] told her the voucher she picked up on 05/13/2017 was not effective. Please call [phone].

## 2017-05-20 NOTE — Telephone Encounter (Signed)
Patient informed that we are working on prior authorization for her.  Just spoke with [facility] & medication is still not going through.  Will call [place] [employer] (Medicaid) for further follow up.  Samples left up front for patient.

## 2017-05-26 ENCOUNTER — Telehealth: Payer: Self-pay | Admitting: *Deleted

## 2017-05-26 MED ORDER — AZILSARTAN MEDOXOMIL 40 MG PO TABS: 40.0000 mg | ORAL_TABLET | Freq: Every day | ORAL | 0 refills | Status: DC

## 2017-05-26 NOTE — Telephone Encounter (Signed)
Patient recently started on Edarbi 40mg  daily.  [place] Medicaid declined prior authorization as patient only has family planning coverage.  [facility] patient assistance application given to patient & 7 days of samples provided.

## 2017-06-01 ENCOUNTER — Other Ambulatory Visit: Payer: Self-pay | Admitting: Nurse Practitioner

## 2017-06-01 DIAGNOSIS — R601 Generalized edema: Secondary | ICD-10-CM

## 2017-06-02 DIAGNOSIS — Z736 Limitation of activities due to disability: Secondary | ICD-10-CM

## 2017-06-04 ENCOUNTER — Other Ambulatory Visit: Payer: Self-pay | Admitting: *Deleted

## 2017-06-04 MED ORDER — AZILSARTAN MEDOXOMIL 40 MG PO TABS: 40.0000 mg | ORAL_TABLET | Freq: Every day | ORAL | 3 refills | Status: DC

## 2017-06-08 ENCOUNTER — Encounter: Payer: Self-pay | Admitting: Cardiovascular Disease

## 2017-06-08 ENCOUNTER — Other Ambulatory Visit: Payer: Self-pay

## 2017-06-08 ENCOUNTER — Ambulatory Visit (INDEPENDENT_AMBULATORY_CARE_PROVIDER_SITE_OTHER): Payer: Self-pay | Admitting: Cardiovascular Disease

## 2017-06-08 ENCOUNTER — Encounter: Payer: Self-pay | Admitting: *Deleted

## 2017-06-08 VITALS — BP 146/90 | HR 83 | Ht 67.0 in | Wt 206.0 lb

## 2017-06-08 DIAGNOSIS — I25708 Atherosclerosis of coronary artery bypass graft(s), unspecified, with other forms of angina pectoris: Secondary | ICD-10-CM

## 2017-06-08 DIAGNOSIS — R600 Localized edema: Secondary | ICD-10-CM

## 2017-06-08 DIAGNOSIS — E785 Hyperlipidemia, unspecified: Secondary | ICD-10-CM

## 2017-06-08 DIAGNOSIS — I10 Essential (primary) hypertension: Secondary | ICD-10-CM

## 2017-06-08 NOTE — Patient Instructions (Signed)

## 2017-06-08 NOTE — Progress Notes (Signed)
SUBJECTIVE: The patient presents for routine follow-up.  I recently prescribed Edarbi she was unable to obtain losartan.  She was diagnosed with an inferior ST elevation myocardial infarction in September 2018.  She had multivessel CAD and ultimately underwent four-vessel CABG with a LIMA to LAD, SVG to Diagonal, SVG to OM2, and SVG to PDA.  Echocardiogram demonstrated normal left ventricular systolic function and regional wall motion, LVEF 60-65%, elevated filling pressures, and mild aortic and mitral regurgitation.  She had some mild plaque disease in the internal carotid arteries.  Lipids 12/29/16: Total cholesterol 117, triglycerides 108, HDL 41, LDL 54.  The patient denies any symptoms of chest pain, palpitations, shortness of breath, lightheadedness, dizziness, leg swelling, orthopnea, PND, and syncope.  She wants to walk more once the weather gets warmer.  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   Review of Systems: As per "subjective", otherwise negative.  No Known Allergies  Current Outpatient Medications  Medication Sig Dispense Refill  . aspirin EC 325 MG EC tablet Take 1 tablet (325 mg total) by mouth daily.    Marland Kitchen atorvastatin (LIPITOR) 80 MG tablet Take 1 tablet (80 mg total) by mouth daily at 6 PM. 30 tablet 11  . Azilsartan Medoxomil (EDARBI) 40 MG TABS Take 40 mg by mouth daily. 90 tablet 3  . furosemide (LASIX) 40 MG tablet TAKE 1 TABLET BY MOUTH ONCE DAILY 30 tablet 9  . metoprolol tartrate (LOPRESSOR) 25 MG tablet Take 25 mg by mouth 2 (two) times daily.    Marland Kitchen oxyCODONE (OXY IR/ROXICODONE) 5 MG immediate release tablet Take 1-2 tablets (5-10 mg total) by mouth every 6 (six) hours as needed for severe pain. 30 tablet 0  . potassium chloride SA (K-DUR,KLOR-CON) 20 MEQ tablet Take 1 tablet (20 mEq total) by mouth daily. 14 tablet 0   No current facility-administered medications for this visit.     Past Medical History:    Diagnosis Date  . CAD, multiple vessel 11/27/2016   pRCA 60% &55%, dRCA 95% & 90% (very tortuous); ost LAD 65%, mLAD 90%, Ost D1 50% &60%, OM2 60%.  EF 55-65%, High LVEDP 32-35%, Mod (3+) MR  . Chronic diastolic heart failure ([place]) 11/27/2016  . Colon polyps   . Essential hypertension 11/27/2016  . Hx of CABG 12/01/2016   LIMA-LAD, SVG-D1, SVG-OM2, SVG-rPDA  . Hyperlipidemia with target low density lipoprotein (LDL) cholesterol less than 70 mg/dL 11/27/2016  . Long-time heavy smoker    1 PPD x >40 yr  . ST elevation myocardial infarction (STEMI) of inferior wall ([place]) 11/27/2016   - Aborted STEMI - EKG STE resolved after ASA & Heparin; MV CAD    Past Surgical History:  Procedure Laterality Date  . ABDOMINAL HYSTERECTOMY    . CORONARY ARTERY BYPASS GRAFT N/A 12/01/2016   Procedure: CORONARY ARTERY BYPASS GRAFTING (CABG) x four , using left internal mammary artery and right leg greater saphenous vein harvested endoscopically - LIMA to LAD, -SVG to DIAG1, SVG to OM2, SVG to PDA ;  Surgeon: Melrose Nakayama, MD;  Location: [facility];  Service: Open Heart Surgery;  Laterality: N/A;  . INTRAOPERATIVE TRANSESOPHAGEAL ECHOCARDIOGRAM N/A 12/01/2016   Procedure: INTRAOPERATIVE TRANSESOPHAGEAL ECHOCARDIOGRAM;  Surgeon: Melrose Nakayama, MD;  Location: [facility];  Service: Open Heart Surgery: EF 55-65%. No RWMA. MIld AI-- (post CABG, EF ~50%, Mod AI. MIld MR  . LEFT HEART CATH AND CORONARY ANGIOGRAPHY N/A 11/27/2016  Procedure: LEFT HEART CATH AND CORONARY ANGIOGRAPHY;  Surgeon: Leonie Man, MD;  Location: [facility] INVASIVE CV LAB: pRCA 60% &55%, dRCA 95% & 90% (very tortuous); ost LAD 65%, mLAD 90%, Ost D1 50% &60%, OM2 60%.  EF 55-65%, High LVEDP 32-35%, Mod (3+) MR  . TRANSTHORACIC ECHOCARDIOGRAM  11/27/2016   EF 60-65% but no RWMA. Diastolic dysfunction noted but not calculated    Social History   Socioeconomic History  . Marital status: Unknown    Spouse name: Not on file  . Number of  children: Not on file  . Years of education: Not on file  . Highest education level: Not on file  Occupational History  . Not on file  Social Needs  . Financial resource strain: Not on file  . Food insecurity:    Worry: Not on file    Inability: Not on file  . Transportation needs:    Medical: Not on file    Non-medical: Not on file  Tobacco Use  . Smoking status: Former Smoker    Packs/day: 1.00    Years: 42.00    Pack years: 42.00    Types: Cigarettes    Last attempt to quit: 01/07/2017    Years since quitting: 0.4  . Smokeless tobacco: Never Used  Substance and Sexual Activity  . Alcohol use: No  . Drug use: No  . Sexual activity: Not on file  Lifestyle  . Physical activity:    Days per week: Not on file    Minutes per session: Not on file  . Stress: Not on file  Relationships  . Social connections:    Talks on phone: Not on file    Gets together: Not on file    Attends religious service: Not on file    Active member of club or organization: Not on file    Attends meetings of clubs or organizations: Not on file    Relationship status: Not on file  . Intimate partner violence:    Fear of current or ex partner: Not on file    Emotionally abused: Not on file    Physically abused: Not on file    Forced sexual activity: Not on file  Other Topics Concern  . Not on file  Social History Narrative  . Not on file     Vitals:   06/08/17 1559  BP: (!) 146/90  Pulse: 83  SpO2: 96%  Weight: 206 lb (93.4 kg)  Height: 5\' 7"  (1.702 m)    Wt Readings from Last 3 Encounters:  06/08/17 206 lb (93.4 kg)  01/13/17 198 lb (89.8 kg)  01/05/17 196 lb (88.9 kg)     PHYSICAL EXAM General: NAD HEENT: Normal. Neck: No JVD, no thyromegaly. Lungs: Clear to auscultation bilaterally with normal respiratory effort. CV: Regular rate and rhythm, normal S1/S2, no S3/S4, no murmur. No pretibial or periankle edema.  No carotid bruit.   Abdomen: Soft, nontender, no distention.    Neurologic: Alert and oriented.  Psych: Normal affect. Skin: Normal. Musculoskeletal: No gross deformities.    ECG: Most recent ECG reviewed.   Labs: Lab Results  Component Value Date/Time   K 4.2 12/29/2016 11:40 AM   BUN 16 12/29/2016 11:40 AM   CREATININE 1.01 (H) 12/29/2016 11:40 AM   CREATININE 0.89 12/19/2016 01:28 PM   ALT 12 12/29/2016 11:40 AM   HGB 11.4 12/29/2016 11:40 AM     Lipids: Lab Results  Component Value Date/Time   LDLCALC 54 12/29/2016 11:40 AM  CHOL 117 12/29/2016 11:40 AM   TRIG 108 12/29/2016 11:40 AM   HDL 41 12/29/2016 11:40 AM       ASSESSMENT AND PLAN:  1.  Coronary artery disease with inferior STEMI status post four-vessel CABG: Symptomatically stable.  Continue aspirin, Lipitor, Edarbi, and metoprolol tartrate.   2.  Hyperlipidemia: Lipids reviewed above.  They are well controlled.  Continue high intensity statin therapy with Lipitor 80 mg daily.  3.  Bilateral lower extremity edema: Symptomatically stable on Lasix 40 mg daily with supplemental potassium.  Low-sodium diet was encouraged.  4.  Hypertension: Blood pressure is elevated today.  I recently started Edarbi 40 mg.  I will monitor.   Disposition: Follow up 6 months   Kate Sable, M.D., F.A.C.C.

## 2017-06-09 ENCOUNTER — Telehealth: Payer: Self-pay | Admitting: Cardiovascular Disease

## 2017-06-09 NOTE — Telephone Encounter (Signed)
Needs help with paperwork to get medication Has questions.

## 2017-06-09 NOTE — Telephone Encounter (Signed)
[name] - will forward to Dr Court Joy nurse

## 2017-06-10 NOTE — Telephone Encounter (Signed)
Stated that she did get some papers saying she was approved, some type of Medicaid subsidy.  Will wait to see if card comes & how she is to use to get her medications.  She will keep me informed.

## 2017-06-11 ENCOUNTER — Telehealth: Payer: Self-pay | Admitting: Cardiovascular Disease

## 2017-06-11 NOTE — Telephone Encounter (Signed)
Patient asked that Edd Fabian call her tomorrow in reference to her appeal?

## 2017-06-12 NOTE — Telephone Encounter (Signed)
Left message to return call 

## 2017-06-15 NOTE — Telephone Encounter (Signed)
Notes will be faxed.  She will come by office to sign release.

## 2017-08-25 ENCOUNTER — Telehealth: Payer: Self-pay | Admitting: Cardiovascular Disease

## 2017-08-25 NOTE — Telephone Encounter (Signed)
Patient thinks her vein from her heart is swollen.  Call patient after 2pm

## 2017-08-26 NOTE — Telephone Encounter (Signed)
C/O tightness in center of chest, feels like a swelling in the middle of her chest.  Been bothering x 2 weeks.  Also, having a lot of leg cramps & aching in her legs  X 2-3 weeks.  No swelling noted.  Does c/o leg weakness.  Does c/o feeling winded at times, mostly with exertion - more noticeable.  Has been without the Edarbi x several months.    She has now been able to get some help with insurance - has Medicare / Medicaid & [employer] for prescriptions.  [employer] is giving her some assistance.  Everything begins in July.  Informed her that we can send new rx in for July to let it go through with her new insurance.  In the meantime, message will be forwarded to provider for further suggestions.  OV has been scheduled for 10/13/2017 with Dr. Bronson Ing in [place] office.

## 2017-08-26 NOTE — Telephone Encounter (Signed)
Would like her to record BP and heart rate 4 times per week and during these episodes as well. I may consider a repeat echocardiogram.

## 2017-08-27 NOTE — Telephone Encounter (Signed)
No answer

## 2017-08-31 NOTE — Telephone Encounter (Signed)
Attempted to reach patient again.  No answer at home or mobile number.

## 2017-09-02 NOTE — Telephone Encounter (Signed)
Patient notified and verbalized understanding. 

## 2017-09-02 NOTE — Telephone Encounter (Signed)
Left message to return call.  (home number)

## 2017-09-09 ENCOUNTER — Telehealth: Payer: Self-pay | Admitting: *Deleted

## 2017-09-09 NOTE — Telephone Encounter (Signed)
Received fax from [place] approved 03/08/2017 thru 03/09/2018.

## 2017-10-13 ENCOUNTER — Ambulatory Visit (INDEPENDENT_AMBULATORY_CARE_PROVIDER_SITE_OTHER): Payer: Medicare Other | Admitting: Cardiovascular Disease

## 2017-10-13 ENCOUNTER — Encounter: Payer: Self-pay | Admitting: Cardiovascular Disease

## 2017-10-13 ENCOUNTER — Other Ambulatory Visit: Payer: Self-pay | Admitting: Cardiovascular Disease

## 2017-10-13 VITALS — BP 134/88 | HR 85 | Wt 216.0 lb

## 2017-10-13 DIAGNOSIS — I10 Essential (primary) hypertension: Secondary | ICD-10-CM

## 2017-10-13 DIAGNOSIS — I25708 Atherosclerosis of coronary artery bypass graft(s), unspecified, with other forms of angina pectoris: Secondary | ICD-10-CM | POA: Diagnosis not present

## 2017-10-13 DIAGNOSIS — E785 Hyperlipidemia, unspecified: Secondary | ICD-10-CM | POA: Diagnosis not present

## 2017-10-13 DIAGNOSIS — R600 Localized edema: Secondary | ICD-10-CM | POA: Diagnosis not present

## 2017-10-13 DIAGNOSIS — R601 Generalized edema: Secondary | ICD-10-CM

## 2017-10-13 MED ORDER — ASPIRIN EC 81 MG PO TBEC: 81.0000 mg | DELAYED_RELEASE_TABLET | Freq: Every day | ORAL | Status: DC

## 2017-10-13 MED ORDER — ENSURE ACTIVE HEART HEALTH PO LIQD: 1.0000 | ORAL | 11 refills | Status: DC | PRN

## 2017-10-13 MED ORDER — AMLODIPINE BESYLATE 5 MG PO TABS: 5.0000 mg | ORAL_TABLET | Freq: Every day | ORAL | 6 refills | Status: DC

## 2017-10-13 NOTE — Patient Instructions (Signed)
Medication Instructions:   Decrease Aspirin to 81mg  daily.   Stop Edarbi.  Begin Amlodipine 5mg  daily.   Continue all other medications.    Labwork: none  Testing/Procedures: none  Follow-Up: Your physician wants you to follow up in: 6 months.  You will receive a reminder letter in the mail one-two months in advance.  If you don't receive a letter, please call our office to schedule the follow up appointment   Any Other Special Instructions Will Be Listed Below (If Applicable). You have been referred to:  Cardiac Rehab   If you need a refill on your cardiac medications before your next appointment, please call your pharmacy.

## 2017-10-13 NOTE — Progress Notes (Signed)
SUBJECTIVE: The patient presents for routine follow-up.  She called our office on 08/26/2017 complaining of tightness in the center of her chest as well as leg aching and cramps.  She also has some exertional dyspnea.  She was diagnosed with an inferior ST elevation myocardial infarction in September 2018. She had multivessel CAD and ultimately underwent four-vessel CABG with a LIMA to LAD, SVG to Diagonal, SVG to OM2, and SVG to PDA.  Echocardiogram demonstrated normal left ventricular systolic function and regional wall motion, LVEF 60-65%, elevated filling pressures, and mild aortic and mitral regurgitation.  She had some mild plaque disease in the internal carotid arteries.  Lipids 12/29/16: Total cholesterol 117, triglycerides 108, HDL 41, LDL 54.  She still does not have a PCP.  She has put on 10 pounds since her last visit with me on 06/08/2017.  She said it is due to inactivity.  She said she also does not adhere to a cardiac diet.  She has some exertional dyspnea which she attributes to weight gain.  She denies exertional chest pain.  She said [place] gives her loose stools.  She denies leg swelling, orthopnea, and paroxysmal nocturnal dyspnea.  She is checked her blood pressure at home and it runs in the 140/90 range.  She requests a prescription for Ensure.     Review of Systems: As per "subjective", otherwise negative.  No Known Allergies  Current Outpatient Medications  Medication Sig Dispense Refill  . aspirin EC 325 MG EC tablet Take 1 tablet (325 mg total) by mouth daily.    Marland Kitchen atorvastatin (LIPITOR) 80 MG tablet Take 1 tablet (80 mg total) by mouth daily at 6 PM. 30 tablet 11  . Azilsartan Medoxomil (EDARBI) 40 MG TABS Take 40 mg by mouth daily. 90 tablet 3  . furosemide (LASIX) 40 MG tablet TAKE 1 TABLET BY MOUTH ONCE DAILY 30 tablet 9  . metoprolol tartrate (LOPRESSOR) 50 MG tablet Take 75 mg by mouth 2 (two) times daily.  11  . potassium chloride SA  (K-DUR,KLOR-CON) 20 MEQ tablet Take 1 tablet (20 mEq total) by mouth daily. 14 tablet 0   No current facility-administered medications for this visit.     Past Medical History:  Diagnosis Date  . CAD, multiple vessel 11/27/2016   pRCA 60% &55%, dRCA 95% & 90% (very tortuous); ost LAD 65%, mLAD 90%, Ost D1 50% &60%, OM2 60%.  EF 55-65%, High LVEDP 32-35%, Mod (3+) MR  . Chronic diastolic heart failure ([place]) 11/27/2016  . Colon polyps   . Essential hypertension 11/27/2016  . Hx of CABG 12/01/2016   LIMA-LAD, SVG-D1, SVG-OM2, SVG-rPDA  . Hyperlipidemia with target low density lipoprotein (LDL) cholesterol less than 70 mg/dL 11/27/2016  . Long-time heavy smoker    1 PPD x >40 yr  . ST elevation myocardial infarction (STEMI) of inferior wall ([place]) 11/27/2016   - Aborted STEMI - EKG STE resolved after ASA & Heparin; MV CAD    Past Surgical History:  Procedure Laterality Date  . ABDOMINAL HYSTERECTOMY    . CORONARY ARTERY BYPASS GRAFT N/A 12/01/2016   Procedure: CORONARY ARTERY BYPASS GRAFTING (CABG) x four , using left internal mammary artery and right leg greater saphenous vein harvested endoscopically - LIMA to LAD, -SVG to DIAG1, SVG to OM2, SVG to PDA ;  Surgeon: Melrose Nakayama, MD;  Location: [place];  Service: Open Heart Surgery;  Laterality: N/A;  . INTRAOPERATIVE TRANSESOPHAGEAL ECHOCARDIOGRAM N/A 12/01/2016   Procedure:  INTRAOPERATIVE TRANSESOPHAGEAL ECHOCARDIOGRAM;  Surgeon: Melrose Nakayama, MD;  Location: [facility] OR;  Service: Open Heart Surgery: EF 55-65%. No RWMA. MIld AI-- (post CABG, EF ~50%, Mod AI. MIld MR  . LEFT HEART CATH AND CORONARY ANGIOGRAPHY N/A 11/27/2016   Procedure: LEFT HEART CATH AND CORONARY ANGIOGRAPHY;  Surgeon: Leonie Man, MD;  [facility]: [facility] INVASIVE CV LAB: pRCA 60% &55%, dRCA 95% & 90% (very tortuous); ost LAD 65%, mLAD 90%, Ost D1 50% &60%, OM2 60%.  EF 55-65%, High LVEDP 32-35%, Mod (3+) MR  . TRANSTHORACIC ECHOCARDIOGRAM  11/27/2016   EF 60-65% but  no RWMA. Diastolic dysfunction noted but not calculated    Social History   Socioeconomic History  . Marital status: Unknown    Spouse name: Not on file  . Number of children: Not on file  . Years of education: Not on file  . Highest education level: Not on file  Occupational History  . Not on file  Social Needs  . Financial resource strain: Not on file  . Food insecurity:    Worry: Not on file    Inability: Not on file  . Transportation needs:    Medical: Not on file    Non-medical: Not on file  Tobacco Use  . Smoking status: Former Smoker    Packs/day: 1.00    Years: 42.00    Pack years: 42.00    Types: Cigarettes    Last attempt to quit: 01/07/2017    Years since quitting: 0.7  . Smokeless tobacco: Never Used  Substance and Sexual Activity  . Alcohol use: No  . Drug use: No  . Sexual activity: Not on file  Lifestyle  . Physical activity:    Days per week: Not on file    Minutes per session: Not on file  . Stress: Not on file  Relationships  . Social connections:    Talks on phone: Not on file    Gets together: Not on file    Attends religious service: Not on file    Active member of club or organization: Not on file    Attends meetings of clubs or organizations: Not on file    Relationship status: Not on file  . Intimate partner violence:    Fear of current or ex partner: Not on file    Emotionally abused: Not on file    Physically abused: Not on file    Forced sexual activity: Not on file  Other Topics Concern  . Not on file  Social History Narrative  . Not on file     Vitals:   10/13/17 1341  BP: 134/88  Pulse: 85  SpO2: 98%  Weight: 216 lb (98 kg)    Wt Readings from Last 3 Encounters:  10/13/17 216 lb (98 kg)  06/08/17 206 lb (93.4 kg)  01/13/17 198 lb (89.8 kg)     PHYSICAL EXAM General: NAD HEENT: Normal. Neck: No JVD, no thyromegaly. Lungs: Clear to auscultation bilaterally with normal respiratory effort. CV: Regular rate and  rhythm, normal S1/S2, no S3/S4, no murmur. No pretibial or periankle edema.  No carotid bruit.   Abdomen: Soft, nontender, no distention.  Neurologic: Alert and oriented.  Psych: Normal affect. Skin: Normal. Musculoskeletal: No gross deformities.    ECG: Reviewed above under Subjective   Labs: Lab Results  Component Value Date/Time   K 4.2 12/29/2016 11:40 AM   BUN 16 12/29/2016 11:40 AM   CREATININE 1.01 (H) 12/29/2016 11:40 AM   CREATININE  0.89 12/19/2016 01:28 PM   ALT 12 12/29/2016 11:40 AM   HGB 11.4 12/29/2016 11:40 AM     Lipids: Lab Results  Component Value Date/Time   LDLCALC 54 12/29/2016 11:40 AM   CHOL 117 12/29/2016 11:40 AM   TRIG 108 12/29/2016 11:40 AM   HDL 41 12/29/2016 11:40 AM       ASSESSMENT AND PLAN:  1. Coronary artery disease with inferior STEMI status post four-vessel CABG: Symptomatically stable. Continue aspirin entheses I will reduce to 81 mg), Lipitor, and metoprolol tartrate. I will enroll in cardiac rehabilitation.  2. Hyperlipidemia: Lipids reviewed above. They are well controlled. Continue high intensity statin therapy with Lipitor 80 mg daily.  3. Bilateral lower extremity edema: Symptomatically stable on Lasix 40 mg daily with supplemental potassium. Low-sodium diet was encouraged.  4.  Hypertension: Blood pressure is elevated today.  I recently started Edarbi 40 mg but it gave her loose stools.  I will switch to amlodipine 5 mg daily.     Disposition: Follow up 6 months  Kate Sable, M.D., F.A.C.C.

## 2017-11-17 DIAGNOSIS — M25561 Pain in right knee: Secondary | ICD-10-CM | POA: Diagnosis not present

## 2017-11-17 DIAGNOSIS — Z131 Encounter for screening for diabetes mellitus: Secondary | ICD-10-CM | POA: Diagnosis not present

## 2017-11-17 DIAGNOSIS — M1711 Unilateral primary osteoarthritis, right knee: Secondary | ICD-10-CM | POA: Diagnosis not present

## 2017-11-17 DIAGNOSIS — E782 Mixed hyperlipidemia: Secondary | ICD-10-CM | POA: Diagnosis not present

## 2017-11-17 DIAGNOSIS — M179 Osteoarthritis of knee, unspecified: Secondary | ICD-10-CM | POA: Diagnosis not present

## 2017-11-17 DIAGNOSIS — I10 Essential (primary) hypertension: Secondary | ICD-10-CM | POA: Diagnosis not present

## 2017-11-17 DIAGNOSIS — E1165 Type 2 diabetes mellitus with hyperglycemia: Secondary | ICD-10-CM | POA: Diagnosis not present

## 2017-11-24 DIAGNOSIS — R262 Difficulty in walking, not elsewhere classified: Secondary | ICD-10-CM | POA: Diagnosis not present

## 2017-11-24 DIAGNOSIS — M1711 Unilateral primary osteoarthritis, right knee: Secondary | ICD-10-CM | POA: Diagnosis not present

## 2017-11-24 DIAGNOSIS — Z6833 Body mass index (BMI) 33.0-33.9, adult: Secondary | ICD-10-CM | POA: Diagnosis not present

## 2017-11-24 DIAGNOSIS — I10 Essential (primary) hypertension: Secondary | ICD-10-CM | POA: Diagnosis not present

## 2017-11-24 DIAGNOSIS — E119 Type 2 diabetes mellitus without complications: Secondary | ICD-10-CM | POA: Diagnosis not present

## 2017-11-25 ENCOUNTER — Other Ambulatory Visit: Payer: Self-pay | Admitting: *Deleted

## 2017-11-25 DIAGNOSIS — Z951 Presence of aortocoronary bypass graft: Secondary | ICD-10-CM

## 2017-11-27 DIAGNOSIS — E119 Type 2 diabetes mellitus without complications: Secondary | ICD-10-CM | POA: Diagnosis not present

## 2017-11-27 DIAGNOSIS — I10 Essential (primary) hypertension: Secondary | ICD-10-CM | POA: Diagnosis not present

## 2017-11-27 DIAGNOSIS — M1711 Unilateral primary osteoarthritis, right knee: Secondary | ICD-10-CM | POA: Diagnosis not present

## 2017-11-27 DIAGNOSIS — Z6833 Body mass index (BMI) 33.0-33.9, adult: Secondary | ICD-10-CM | POA: Diagnosis not present

## 2017-11-27 DIAGNOSIS — R262 Difficulty in walking, not elsewhere classified: Secondary | ICD-10-CM | POA: Diagnosis not present

## 2017-11-28 DIAGNOSIS — R262 Difficulty in walking, not elsewhere classified: Secondary | ICD-10-CM | POA: Diagnosis not present

## 2017-11-28 DIAGNOSIS — M1711 Unilateral primary osteoarthritis, right knee: Secondary | ICD-10-CM | POA: Diagnosis not present

## 2017-11-28 DIAGNOSIS — Z6833 Body mass index (BMI) 33.0-33.9, adult: Secondary | ICD-10-CM | POA: Diagnosis not present

## 2017-11-28 DIAGNOSIS — I10 Essential (primary) hypertension: Secondary | ICD-10-CM | POA: Diagnosis not present

## 2017-11-28 DIAGNOSIS — E119 Type 2 diabetes mellitus without complications: Secondary | ICD-10-CM | POA: Diagnosis not present

## 2017-11-30 DIAGNOSIS — R262 Difficulty in walking, not elsewhere classified: Secondary | ICD-10-CM | POA: Diagnosis not present

## 2017-11-30 DIAGNOSIS — I10 Essential (primary) hypertension: Secondary | ICD-10-CM | POA: Diagnosis not present

## 2017-11-30 DIAGNOSIS — M1711 Unilateral primary osteoarthritis, right knee: Secondary | ICD-10-CM | POA: Diagnosis not present

## 2017-11-30 DIAGNOSIS — Z6833 Body mass index (BMI) 33.0-33.9, adult: Secondary | ICD-10-CM | POA: Diagnosis not present

## 2017-11-30 DIAGNOSIS — E119 Type 2 diabetes mellitus without complications: Secondary | ICD-10-CM | POA: Diagnosis not present

## 2017-12-01 DIAGNOSIS — Z86010 Personal history of colonic polyps: Secondary | ICD-10-CM | POA: Diagnosis not present

## 2017-12-02 DIAGNOSIS — R262 Difficulty in walking, not elsewhere classified: Secondary | ICD-10-CM | POA: Diagnosis not present

## 2017-12-02 DIAGNOSIS — I10 Essential (primary) hypertension: Secondary | ICD-10-CM | POA: Diagnosis not present

## 2017-12-02 DIAGNOSIS — E119 Type 2 diabetes mellitus without complications: Secondary | ICD-10-CM | POA: Diagnosis not present

## 2017-12-02 DIAGNOSIS — M1711 Unilateral primary osteoarthritis, right knee: Secondary | ICD-10-CM | POA: Diagnosis not present

## 2017-12-02 DIAGNOSIS — Z6833 Body mass index (BMI) 33.0-33.9, adult: Secondary | ICD-10-CM | POA: Diagnosis not present

## 2017-12-04 DIAGNOSIS — M1711 Unilateral primary osteoarthritis, right knee: Secondary | ICD-10-CM | POA: Diagnosis not present

## 2017-12-04 DIAGNOSIS — I10 Essential (primary) hypertension: Secondary | ICD-10-CM | POA: Diagnosis not present

## 2017-12-04 DIAGNOSIS — Z6833 Body mass index (BMI) 33.0-33.9, adult: Secondary | ICD-10-CM | POA: Diagnosis not present

## 2017-12-04 DIAGNOSIS — R262 Difficulty in walking, not elsewhere classified: Secondary | ICD-10-CM | POA: Diagnosis not present

## 2017-12-04 DIAGNOSIS — E119 Type 2 diabetes mellitus without complications: Secondary | ICD-10-CM | POA: Diagnosis not present

## 2017-12-08 ENCOUNTER — Other Ambulatory Visit: Payer: Self-pay | Admitting: *Deleted

## 2017-12-08 DIAGNOSIS — R601 Generalized edema: Secondary | ICD-10-CM

## 2017-12-08 MED ORDER — METOPROLOL TARTRATE 50 MG PO TABS: 75.0000 mg | ORAL_TABLET | Freq: Two times a day (BID) | ORAL | 0 refills | Status: DC

## 2017-12-08 MED ORDER — POTASSIUM CHLORIDE CRYS ER 20 MEQ PO TBCR: 20.0000 meq | EXTENDED_RELEASE_TABLET | Freq: Every day | ORAL | 0 refills | Status: DC

## 2017-12-08 MED ORDER — FUROSEMIDE 40 MG PO TABS: 40.0000 mg | ORAL_TABLET | Freq: Every day | ORAL | 0 refills | Status: DC

## 2017-12-08 MED ORDER — AMLODIPINE BESYLATE 5 MG PO TABS: 5.0000 mg | ORAL_TABLET | Freq: Every day | ORAL | 0 refills | Status: DC

## 2017-12-08 MED ORDER — ATORVASTATIN CALCIUM 80 MG PO TABS: 80.0000 mg | ORAL_TABLET | Freq: Every day | ORAL | 0 refills | Status: DC

## 2017-12-25 DIAGNOSIS — H5210 Myopia, unspecified eye: Secondary | ICD-10-CM | POA: Diagnosis not present

## 2017-12-25 DIAGNOSIS — E119 Type 2 diabetes mellitus without complications: Secondary | ICD-10-CM | POA: Diagnosis not present

## 2017-12-25 DIAGNOSIS — I10 Essential (primary) hypertension: Secondary | ICD-10-CM | POA: Diagnosis not present

## 2018-02-13 ENCOUNTER — Other Ambulatory Visit: Payer: Self-pay | Admitting: Cardiovascular Disease

## 2018-02-13 DIAGNOSIS — R601 Generalized edema: Secondary | ICD-10-CM

## 2018-02-16 DIAGNOSIS — Z0000 Encounter for general adult medical examination without abnormal findings: Secondary | ICD-10-CM | POA: Diagnosis not present

## 2018-02-16 DIAGNOSIS — I10 Essential (primary) hypertension: Secondary | ICD-10-CM | POA: Diagnosis not present

## 2018-02-16 DIAGNOSIS — M179 Osteoarthritis of knee, unspecified: Secondary | ICD-10-CM | POA: Diagnosis not present

## 2018-02-16 DIAGNOSIS — E782 Mixed hyperlipidemia: Secondary | ICD-10-CM | POA: Diagnosis not present

## 2018-02-16 DIAGNOSIS — E1151 Type 2 diabetes mellitus with diabetic peripheral angiopathy without gangrene: Secondary | ICD-10-CM | POA: Diagnosis not present

## 2018-02-25 DIAGNOSIS — Z780 Asymptomatic menopausal state: Secondary | ICD-10-CM | POA: Diagnosis not present

## 2018-02-25 DIAGNOSIS — M810 Age-related osteoporosis without current pathological fracture: Secondary | ICD-10-CM | POA: Diagnosis not present

## 2018-03-29 DIAGNOSIS — Z1231 Encounter for screening mammogram for malignant neoplasm of breast: Secondary | ICD-10-CM | POA: Diagnosis not present

## 2018-05-18 DIAGNOSIS — E782 Mixed hyperlipidemia: Secondary | ICD-10-CM | POA: Diagnosis not present

## 2018-05-18 DIAGNOSIS — M179 Osteoarthritis of knee, unspecified: Secondary | ICD-10-CM | POA: Diagnosis not present

## 2018-05-18 DIAGNOSIS — Z0000 Encounter for general adult medical examination without abnormal findings: Secondary | ICD-10-CM | POA: Diagnosis not present

## 2018-05-18 DIAGNOSIS — I10 Essential (primary) hypertension: Secondary | ICD-10-CM | POA: Diagnosis not present

## 2018-05-18 DIAGNOSIS — E1151 Type 2 diabetes mellitus with diabetic peripheral angiopathy without gangrene: Secondary | ICD-10-CM | POA: Diagnosis not present

## 2018-05-18 DIAGNOSIS — Z6833 Body mass index (BMI) 33.0-33.9, adult: Secondary | ICD-10-CM | POA: Diagnosis not present

## 2018-06-29 ENCOUNTER — Telehealth: Payer: Self-pay | Admitting: Student

## 2018-06-29 NOTE — Telephone Encounter (Signed)
CONSENT READ TO PATIENT, PT GAVE CONSENT.   YOUR CARDIOLOGY TEAM HAS ARRANGED FOR AN E-VISIT FOR YOUR APPOINTMENT - PLEASE REVIEW IMPORTANT INFORMATION BELOW SEVERAL DAYS PRIOR TO YOUR APPOINTMENT  Due to the recent COVID-19 pandemic, we are transitioning in-person office visits to tele-medicine visits in an effort to decrease unnecessary exposure to our patients and staff. Medicare and most insurances are covering these visits without a copay needed. We also encourage you to sign up for MyChart if you have not already done so. You will need a smartphone if possible. For patients that do not have this, we can still complete the visit using a regular telephone but do prefer a smartphone to enable video when possible. You may have a close family member that lives with you that can help. If possible, we also ask that you have a blood pressure cuff and scale at home to measure your blood pressure, heart rate and weight prior to your scheduled appointment. Patients with clinical needs that need an in-person evaluation and testing will still be able to come to the office if absolutely necessary. If you have any questions, feel free to call our office.    IF YOU HAVE A SMARTPHONE, PLEASE DOWNLOAD THE WEBEX APP TO YOUR SMARTPHONE  - If Apple, go to [employer] and type in WebEx in the search bar. [employer], the blue/green circle. The app is free but as with any other app download, your phone may require you to verify saved payment information or Apple password. You do NOT have to create a WebEx account.  - If Android, go to [employer] and type in [employer] in the search bar. [place] [employer], the blue/green circle. The app is free but as with any other app download, your phone may require you to verify saved payment information or Android password. You do NOT have to create a WebEx account.  It is very helpful to have this downloaded before your visit.    2-3 DAYS BEFORE  YOUR APPOINTMENT  You will receive a telephone call from one of our [facility] team members - your caller ID may say "Unknown caller." If this is a video visit, we will confirm that you have been able to download the WebEx app. We will remind you check your blood pressure, heart rate and weight prior to your scheduled appointment. If you have an Apple Watch or Kardia, please upload any pertinent ECG strips the day before or morning of your appointment to [facility]. Our staff will also make sure you have reviewed the consent and agree to move forward with your scheduled tele-health visit.     THE DAY OF YOUR APPOINTMENT  Approximately 15 minutes prior to your scheduled appointment, you will receive a telephone call from one of [facility] team - your caller ID may say "Unknown caller."  Our staff will confirm medications, vital signs for the day and any symptoms you may be experiencing. Please have this information available prior to the time of visit start. It may also be helpful for you to have a pad of paper and pen handy for any instructions given during your visit. They will also walk you through joining the WebEx smartphone meeting if this is a video visit.    CONSENT FOR TELE-HEALTH VISIT - PLEASE REVIEW  I hereby voluntarily request, consent and authorize [facility] and its employed or contracted physicians, physician assistants, nurse practitioners or other licensed health care professionals (the Practitioner), to provide  me with telemedicine health care services (the Services") as deemed necessary by the treating Practitioner. I acknowledge and consent to receive the Services by the Practitioner via telemedicine. I understand that the telemedicine visit will involve communicating with the Practitioner through live audiovisual communication technology and the disclosure of certain medical information by electronic transmission. I acknowledge that I have been given the opportunity to request an  in-person assessment or other available alternative prior to the telemedicine visit and am voluntarily participating in the telemedicine visit.  I understand that I have the right to withhold or withdraw my consent to the use of telemedicine in the course of my care at any time, without affecting my right to future care or treatment, and that the Practitioner or I may terminate the telemedicine visit at any time. I understand that I have the right to inspect all information obtained and/or recorded in the course of the telemedicine visit and may receive copies of available information for a reasonable fee.  I understand that some of the potential risks of receiving the Services via telemedicine include:   Delay or interruption in medical evaluation due to technological equipment failure or disruption;  Information transmitted may not be sufficient (e.g. poor resolution of images) to allow for appropriate medical decision making by the Practitioner; and/or   In rare instances, security protocols could fail, causing a breach of personal health information.  Furthermore, I acknowledge that it is my responsibility to provide information about my medical history, conditions and care that is complete and accurate to the best of my ability. I acknowledge that Practitioner's advice, recommendations, and/or decision may be based on factors not within their control, such as incomplete or inaccurate data provided by me or distortions of diagnostic images or specimens that may result from electronic transmissions. I understand that the practice of medicine is not an exact science and that Practitioner makes no warranties or guarantees regarding treatment outcomes. I acknowledge that I will receive a copy of this consent concurrently upon execution via email to the email address I last provided but may also request a printed copy by calling the office of [facility].    I understand that my insurance will be  billed for this visit.   I have read or had this consent read to me.  I understand the contents of this consent, which adequately explains the benefits and risks of the Services being provided via telemedicine.   I have been provided ample opportunity to ask questions regarding this consent and the Services and have had my questions answered to my satisfaction.  I give my informed consent for the services to be provided through the use of telemedicine in my medical care  By participating in this telemedicine visit I agree to the above.

## 2018-06-30 ENCOUNTER — Encounter: Payer: Self-pay | Admitting: *Deleted

## 2018-06-30 ENCOUNTER — Encounter: Payer: Self-pay | Admitting: Student

## 2018-06-30 ENCOUNTER — Telehealth (INDEPENDENT_AMBULATORY_CARE_PROVIDER_SITE_OTHER): Payer: Medicare HMO | Admitting: Student

## 2018-06-30 VITALS — BP 127/88

## 2018-06-30 DIAGNOSIS — Z7189 Other specified counseling: Secondary | ICD-10-CM

## 2018-06-30 DIAGNOSIS — E785 Hyperlipidemia, unspecified: Secondary | ICD-10-CM

## 2018-06-30 DIAGNOSIS — I25810 Atherosclerosis of coronary artery bypass graft(s) without angina pectoris: Secondary | ICD-10-CM | POA: Diagnosis not present

## 2018-06-30 DIAGNOSIS — I10 Essential (primary) hypertension: Secondary | ICD-10-CM

## 2018-06-30 NOTE — Progress Notes (Signed)
Virtual Visit via Telephone Note   This visit type was conducted due to national recommendations for restrictions regarding the COVID-19 Pandemic (e.g. social distancing) in an effort to limit this patient's exposure and mitigate transmission in our community.  Due to her co-morbid illnesses, this patient is at least at moderate risk for complications without adequate follow up.  This format is felt to be most appropriate for this patient at this time.  The patient did not have access to video technology/had technical difficulties with video requiring transitioning to audio format only (telephone).  All issues noted in this document were discussed and addressed.  No physical exam could be performed with this format.  Please refer to the patient's chart for her  consent to telehealth for [facility].   Evaluation Performed:  Follow-up visit  Date:  06/30/2018   ID:  Stephanie, [name] January 15, 1951, MRN [id number]  Patient Location: Home Provider Location: Home  PCP:  Neale Burly, MD  Cardiologist:  Kate Sable, MD  Electrophysiologist:  None   Chief Complaint:  Overdue Follow-Up  History of Present Illness:    [name] is a 68 y.o. female with with past medical history of CAD (s/p CABG in 11/2016 with LIMA-LAD, SVG-D1, SVG-OM2, and SVG-PDA), chronic diastolic CHF, HTN, and HLD who presents for a telehealth visit.   She was last examined by Dr. Bronson Ing in 10/2017 and reported having some dyspnea with exertion which she accredited to her recent weight gain but denied any exertional chest pain. She was continued on ASA, statin,and BB therapy. Was previously intolerant to [place], therefore Amlodipine 5mg  daily was started for improved BP control.   In talking with the patient today, she reports overall doing well from a cardiac perspective since her last office visit. Denies any recent chest pain or dyspnea on exertion. No recent orthopnea, PND, or edema. No longer  taking Lasix as she was switched to Chlorthalidone by her PCP. Says weight has overall been stable on her home scales.   She does report intermittent pain along her right leg which has occurred since CABG. Pain improves once she is walking around. Describes it as mostly an aching sensation along her right buttocks. No prior diagnosis of sciatica or arthritis along her right hip.   The patient does not have symptoms concerning for COVID-19 infection (fever, chills, cough, or new shortness of breath).    Past Medical History:  Diagnosis Date  . CAD, multiple vessel 11/27/2016   pRCA 60% &55%, dRCA 95% & 90% (very tortuous); ost LAD 65%, mLAD 90%, Ost D1 50% &60%, OM2 60%.  EF 55-65%, High LVEDP 32-35%, Mod (3+) MR  . Chronic diastolic heart failure ([place]) 11/27/2016  . Colon polyps   . Essential hypertension 11/27/2016  . Hx of CABG 12/01/2016   LIMA-LAD, SVG-D1, SVG-OM2, SVG-rPDA  . Hyperlipidemia with target low density lipoprotein (LDL) cholesterol less than 70 mg/dL 11/27/2016  . Long-time heavy smoker    1 PPD x >40 yr  . ST elevation myocardial infarction (STEMI) of inferior wall ([place]) 11/27/2016   - Aborted STEMI - EKG STE resolved after ASA & Heparin; MV CAD   Past Surgical History:  Procedure Laterality Date  . ABDOMINAL HYSTERECTOMY    . CORONARY ARTERY BYPASS GRAFT N/A 12/01/2016   Procedure: CORONARY ARTERY BYPASS GRAFTING (CABG) x four , using left internal mammary artery and right leg greater saphenous vein harvested endoscopically - LIMA to LAD, -SVG to DIAG1, SVG to OM2,  SVG to PDA ;  Surgeon: Melrose Nakayama, MD;  Location: [facility];  Service: Open Heart Surgery;  Laterality: N/A;  . INTRAOPERATIVE TRANSESOPHAGEAL ECHOCARDIOGRAM N/A 12/01/2016   Procedure: INTRAOPERATIVE TRANSESOPHAGEAL ECHOCARDIOGRAM;  Surgeon: Melrose Nakayama, MD;  Location: [facility];  Service: Open Heart Surgery: EF 55-65%. No RWMA. MIld AI-- (post CABG, EF ~50%, Mod AI. MIld MR  . LEFT HEART CATH AND  CORONARY ANGIOGRAPHY N/A 11/27/2016   Procedure: LEFT HEART CATH AND CORONARY ANGIOGRAPHY;  Surgeon: Leonie Man, MD;  Location: [facility] INVASIVE CV LAB: pRCA 60% &55%, dRCA 95% & 90% (very tortuous); ost LAD 65%, mLAD 90%, Ost D1 50% &60%, OM2 60%.  EF 55-65%, High LVEDP 32-35%, Mod (3+) MR  . TRANSTHORACIC ECHOCARDIOGRAM  11/27/2016   EF 60-65% but no RWMA. Diastolic dysfunction noted but not calculated     Current Meds  Medication Sig  . amLODipine (NORVASC) 5 MG tablet TAKE 1 TABLET EVERY DAY  . aspirin EC 81 MG tablet Take 1 tablet (81 mg total) by mouth daily.  Mar[place] Kitchen atorvastatin (LIPITOR) 80 MG tablet TAKE 1 TABLET (80 MG TOTAL) BY MOUTH DAILY AT 6 PM.  . chlorthalidone (HYGROTON) 25 MG tablet Take 25 mg by mouth daily.  . metFORMIN (GLUCOPHAGE) 1000 MG tablet Take 1,000 mg by mouth daily with breakfast.  . metoprolol tartrate (LOPRESSOR) 50 MG tablet TAKE 1 AND 1/2 TABLETS TWICE DAILY  . Nutritional Supplements ([place]) LIQD Take 1 Bottle by mouth as needed.  . Omega-3 1000 MG CAPS Take by mouth.  . potassium chloride SA (K-DUR,KLOR-CON) 20 MEQ tablet TAKE 1 TABLET BY MOUTH ONCE DAILY     Allergies:   Patient has no known allergies.   Social History   Tobacco Use  . Smoking status: Former Smoker    Packs/day: 1.00    Years: 42.00    Pack years: 42.00    Types: Cigarettes    Last attempt to quit: 01/07/2017    Years since quitting: 1.4  . Smokeless tobacco: Never Used  Substance Use Topics  . Alcohol use: No  . Drug use: No     Family Hx: The patient's family history includes Heart disease in her brother.  ROS:   Please see the history of present illness.     All other systems reviewed and are negative.   Prior CV studies:   The following studies were reviewed today:  Cardiac Catheterization: 11/2016  Prox RCA-2 lesion, 60 %stenosed. Prox-mid RCA-1 lesion, 55 %stenosed.  Dist RCA-2 lesion, 95 %stenosed. Dist RCA-1 lesion, 90 %stenosed. - This  combination is likely culprit lesion, however with very tortuous RCA in a stable patient did not feel it was a good idea to attempt PTCA at this time.  Ost LAD lesion, 65 %stenosed. Mid LAD lesion, 90 %stenosed.  Ost 1st Diag to 1st Diag lesion, 50 %stenosed. 1st Diag lesion, 60 %stenosed.  Dist LAD lesion, 50 %stenosed. - This is beyond the 2nd Diag branch.  2nd Mrg lesion, 60 %stenosed.  The left ventricular systolic function is normal. The left ventricular ejection fraction is 55-65% by visual estimate.  LV end diastolic pressure is severely elevated. 32-35 mmhg  There is moderate (3+) mitral regurgitation.   Very difficult situation with the patient has severe 2-3 vessel disease involving extensive disease in the distal RCA as well as ostial proximal and mid LAD. As she is now chest pain-free with resolution of her ST elevations, I did not feel like  emergent attempted PCI on the RCA was a prudent choice of action. The tortuosity of the vessel and the eccentric nature of the lesions themselves make it a somewhat unfavorable vessel for PCI albeit possible if necessary.  The combination of the RCA in the extensive disease in the LAD begins a question of whether she would potentially benefit from bypass surgery. I have contacted Dr. Roxan Hockey from [facility] and asked him to see the patient. I was informed that since the patient was not having active chest pain he can the patient will be seen tomorrow. There would be no OR date for tomorrow, so therefore can consider "cooling down over the weekend "with plan for next week.   With elevated LVEDP, concern for acute diastolic heart failure, I decided against balloon pump for afterload reduction in light of her likely not going for urgent surgery anytime soon.   Plan:   Admit to CCU. TR band removal.   Afterload/preload reduction with nitroglycerin drip   Will initiate Aggrastat infusion followed by heparin -- plan Aggrastat for at least  18 hours  Reassess in the morning.   Check an echocardiogram based on concern for murmur on exam and MR on LV gram. Cannot exclude that this was catheter related however.  I will start carvedilol and high-dose atorvastatin  Check lipid panel and A1c  Provided she stays stable overnight, I think we can avoid any further invasive procedures on her today.  Pending CT consultation and review of films by interventional colleagues would determine whether we will proceed with multivessel PCI versus bypass surgery.   Labs/Other Tests and Data Reviewed:    EKG:  No ECG reviewed.  Recent Labs: No results found for requested labs within last 8760 hours.   Recent Lipid Panel Lab Results  Component Value Date/Time   CHOL 117 12/29/2016 11:40 AM   TRIG 108 12/29/2016 11:40 AM   HDL 41 12/29/2016 11:40 AM   CHOLHDL 2.9 12/29/2016 11:40 AM   CHOLHDL 3.8 11/27/2016 09:15 PM   LDLCALC 54 12/29/2016 11:40 AM    Wt Readings from Last 3 Encounters:  10/13/17 216 lb (98 kg)  06/08/17 206 lb (93.4 kg)  01/13/17 198 lb (89.8 kg)     Objective:    Vital Signs:  BP 127/88    General: Pleasant, female sounding in NAD Psych: Normal affect. Neuro: Alert and oriented X 3. Moves all extremities spontaneously. HEENT: Normal  Lungs:  Respirations sound unlabored while talking on the phone.   ASSESSMENT & PLAN:    1. CAD - she is s/p CABG in 11/2016 with LIMA-LAD, SVG-D1, SVG-OM2, and SVG-PDA. Denies any recent chest pain or dyspnea on exertion when performing her routine activities.  - continue with medical therapy at this time including ASA, statin, and BB therapy.   2. HTN - she does not check her BP regularly but says it was 127/88 when checked earlier today and has been well controlled at her office visits.  - continue Amlodipine, Chlorthalidone, and Lopressor at current dosing.   3. HLD - followed by her PCP. She reports having labs obtained within the past month and will request  these records. Continue Atorvastatin 80mg  daily with goal LDL less than 70.  4. COVID-19 Education The signs and symptoms of COVID-19 were discussed with the patient. The importance of social distancing was discussed today.  Time:   Today, I have spent 22 minutes with the patient with telehealth technology discussing the above problems.  Medication Adjustments/Labs and Tests Ordered: Current medicines are reviewed at length with the patient today.  Concerns regarding medicines are outlined above.   Tests Ordered: No orders of the defined types were placed in this encounter.   Medication Changes: No orders of the defined types were placed in this encounter.   Disposition:  Follow up with Dr. Bronson Ing in 1 year (offered 53-month visit but patient prefers 12 months as she has overall been doing well).   Signed, Erma Heritage, PA-C  06/30/2018 6:27 PM    [facility]

## 2018-06-30 NOTE — Patient Instructions (Signed)
Medication Instructions:  Your physician recommends that you continue on your current medications as directed. Please refer to the Current Medication list given to you today.   Labwork: We will request lab results from your PCP   Testing/Procedures: NONE   Follow-Up: Your physician wants you to follow-up in:1 Year with Dr. Bronson Ing. You will receive a reminder letter in the mail two months in advance. If you don't receive a letter, please call our office to schedule the follow-up appointment.   Any Other Special Instructions Will Be Listed Below (If Applicable).     If you need a refill on your cardiac medications before your next appointment, please call your pharmacy.  Thank you for choosing [facility]!

## 2018-09-27 DIAGNOSIS — E1165 Type 2 diabetes mellitus with hyperglycemia: Secondary | ICD-10-CM | POA: Diagnosis not present

## 2018-09-27 DIAGNOSIS — E782 Mixed hyperlipidemia: Secondary | ICD-10-CM | POA: Diagnosis not present

## 2018-09-27 DIAGNOSIS — Z6832 Body mass index (BMI) 32.0-32.9, adult: Secondary | ICD-10-CM | POA: Diagnosis not present

## 2018-09-27 DIAGNOSIS — I10 Essential (primary) hypertension: Secondary | ICD-10-CM | POA: Diagnosis not present

## 2018-09-27 DIAGNOSIS — M179 Osteoarthritis of knee, unspecified: Secondary | ICD-10-CM | POA: Diagnosis not present

## 2018-09-27 DIAGNOSIS — G4762 Sleep related leg cramps: Secondary | ICD-10-CM | POA: Diagnosis not present

## 2018-11-13 IMAGING — DX DG CHEST 1V PORT
1 series · 1 of 1 positions shown · non-contrast
Comparison: Chest radiograph 11/27/2016.

CLINICAL DATA: Patient status post CABG procedure.

EXAM:
PORTABLE CHEST 1 VIEW

[chest ap]
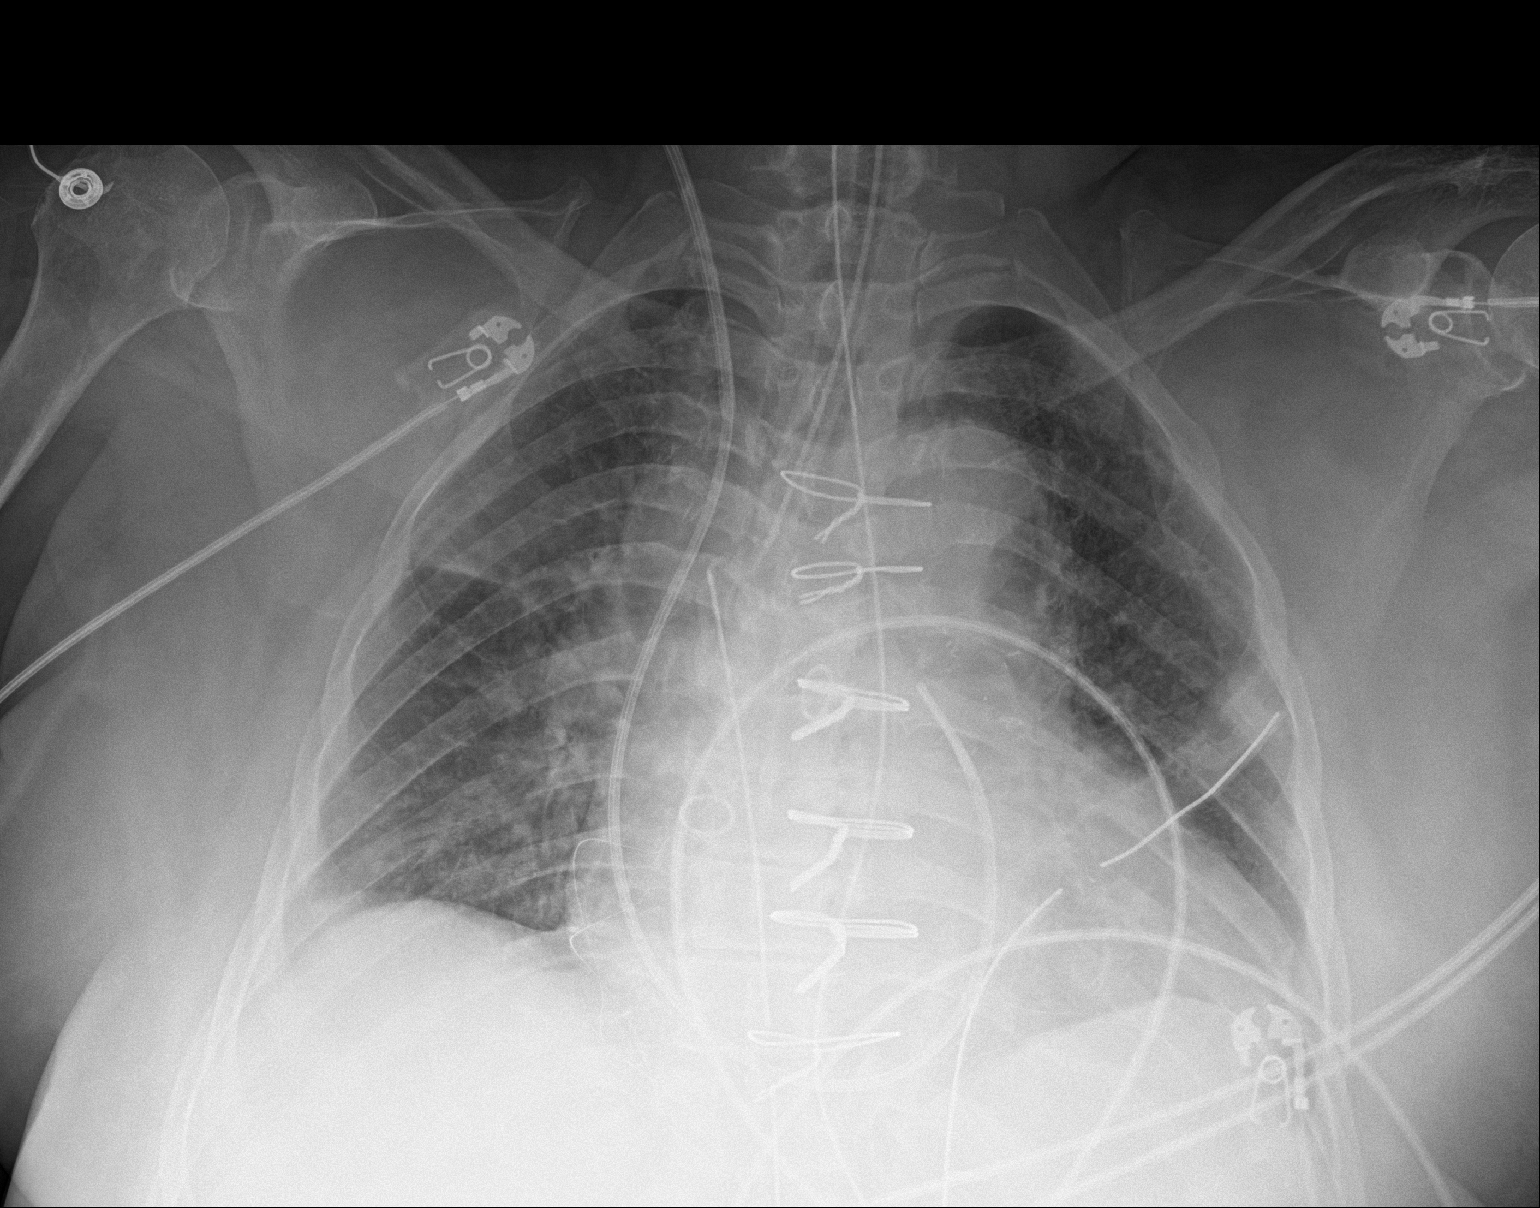

[1 of 1 positions shown; findings below may reference images not displayed]

FINDINGS: ET tube terminates in the right mainstem bronchus, recommend
retraction. Mediastinal drain in place. Pulmonary arterial catheter
projects over the main pulmonary artery. Left chest tube in place.
Status post median sternotomy. Stable enlarged cardiac and
mediastinal contours. Mild bilateral interstitial pulmonary
opacities. No large pleural effusion or pneumothorax.
IMPRESSION: ET tube terminates in the right mainstem bronchus, recommend
retraction, approximately 2.5 cm.

Additional support apparatus as above.

Possible mild interstitial edema.

Findings discuss with covering nurse Chai at [DATE] p.m. on
12/01/2016.

## 2018-11-15 IMAGING — DX DG CHEST 1V PORT
1 series · 1 of 1 positions shown · non-contrast
Comparison: Portable chest x-ray December 02, 2016

CLINICAL DATA: Status post CABG two days ago

EXAM:
PORTABLE CHEST 1 VIEW

[chest ap]
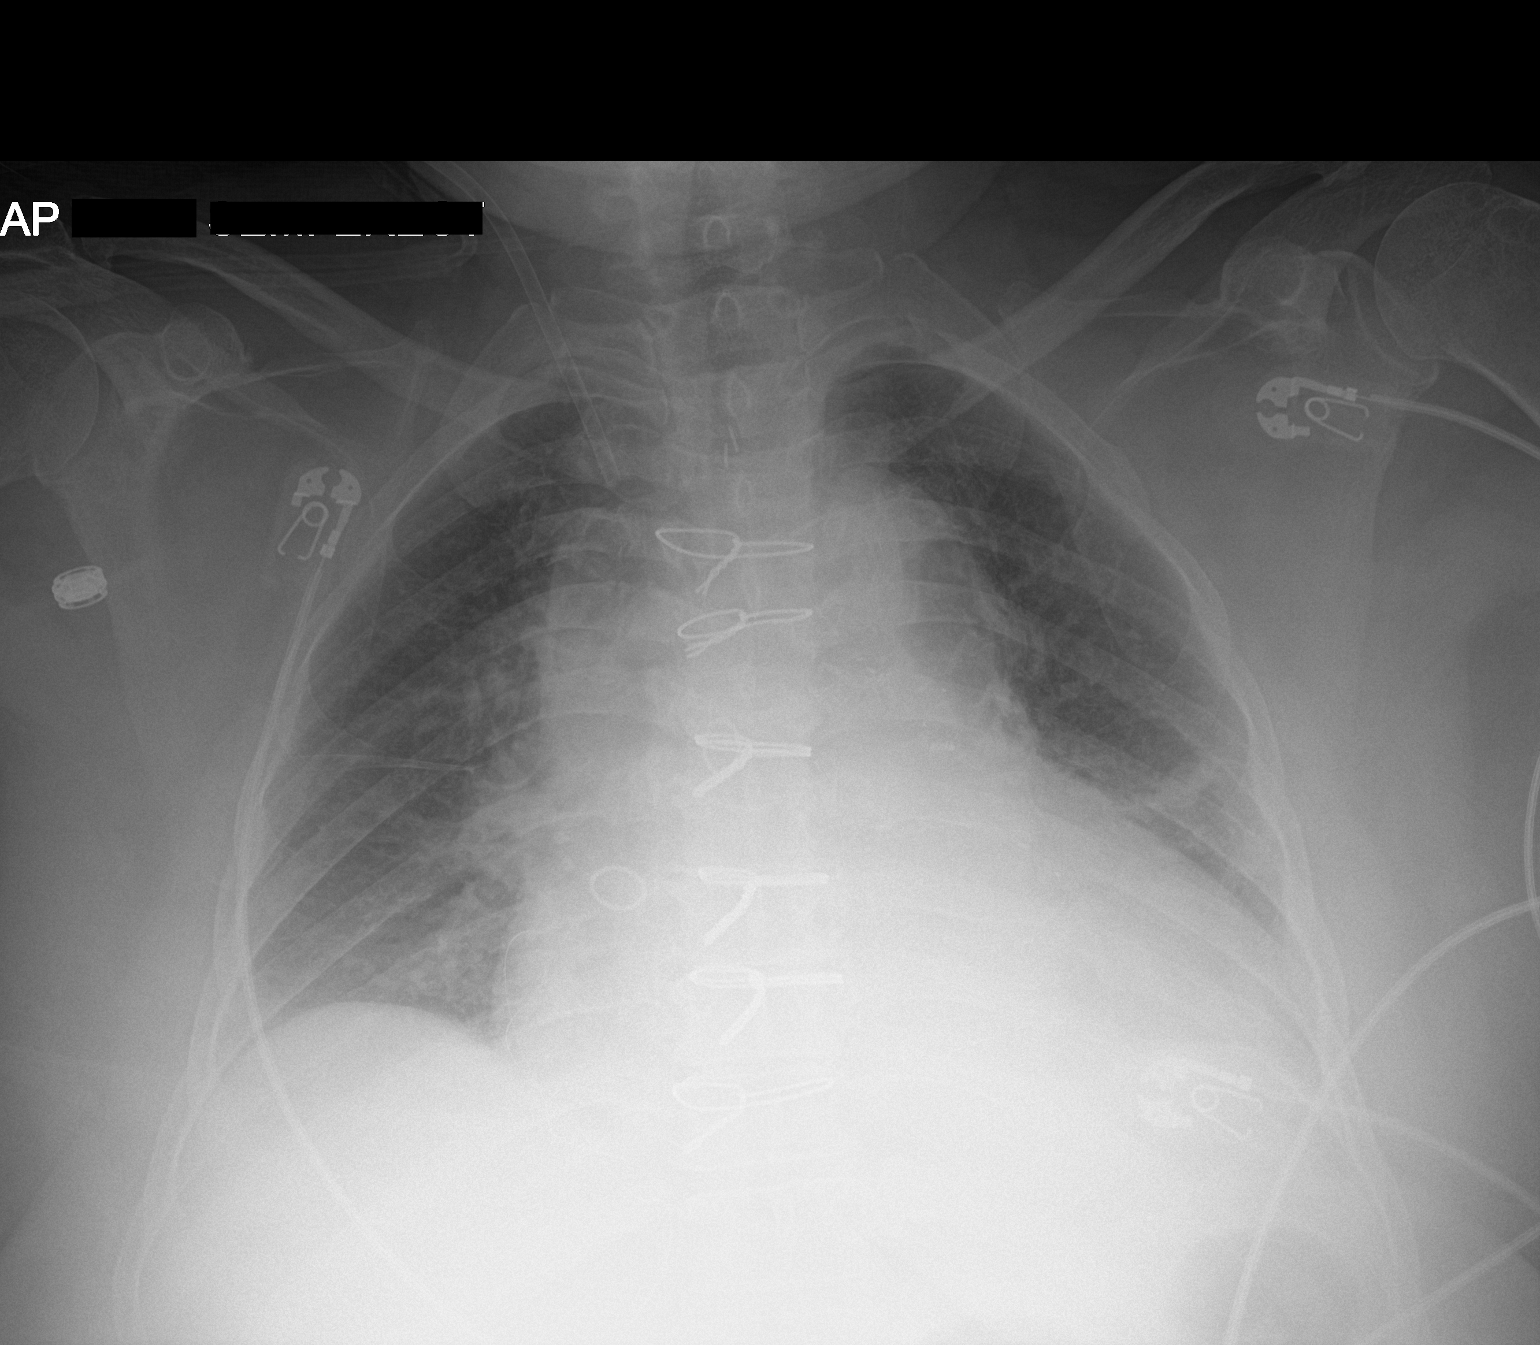

[1 of 1 positions shown; findings below may reference images not displayed]

FINDINGS: The lungs are borderline hypoinflated. The interstitial markings are
less prominent. There is no pneumothorax since removal of the
mediastinal drain and left chest tube. There is no significant
pleural effusion. There is minimal subsegmental atelectasis in the
left mid lung at the site of the previous chest tube. The cardiac
silhouette remains enlarged. The pulmonary vascularity is only
mildly prominent. The sternal wires are intact. The right internal
jugular Cordis sheath tip projects at the junction of the right and
left brachiocephalic veins.
IMPRESSION: Improved appearance of the pulmonary interstitium since yesterday's
study with decreased interstitial edema. Stable cardiomegaly with
decreased pulmonary vascular congestion. No pneumothorax or
significant pleural effusion.

## 2018-11-16 IMAGING — DX DG CHEST 2V
2 series · 2 of 2 positions shown · non-contrast
Comparison: Portable chest x-ray December 03, 2016.

CLINICAL DATA: Follow-up CABG from 3 days ago. The patient
complains of chest soreness and cough.

EXAM:
CHEST  2 VIEW

[x chest ap]
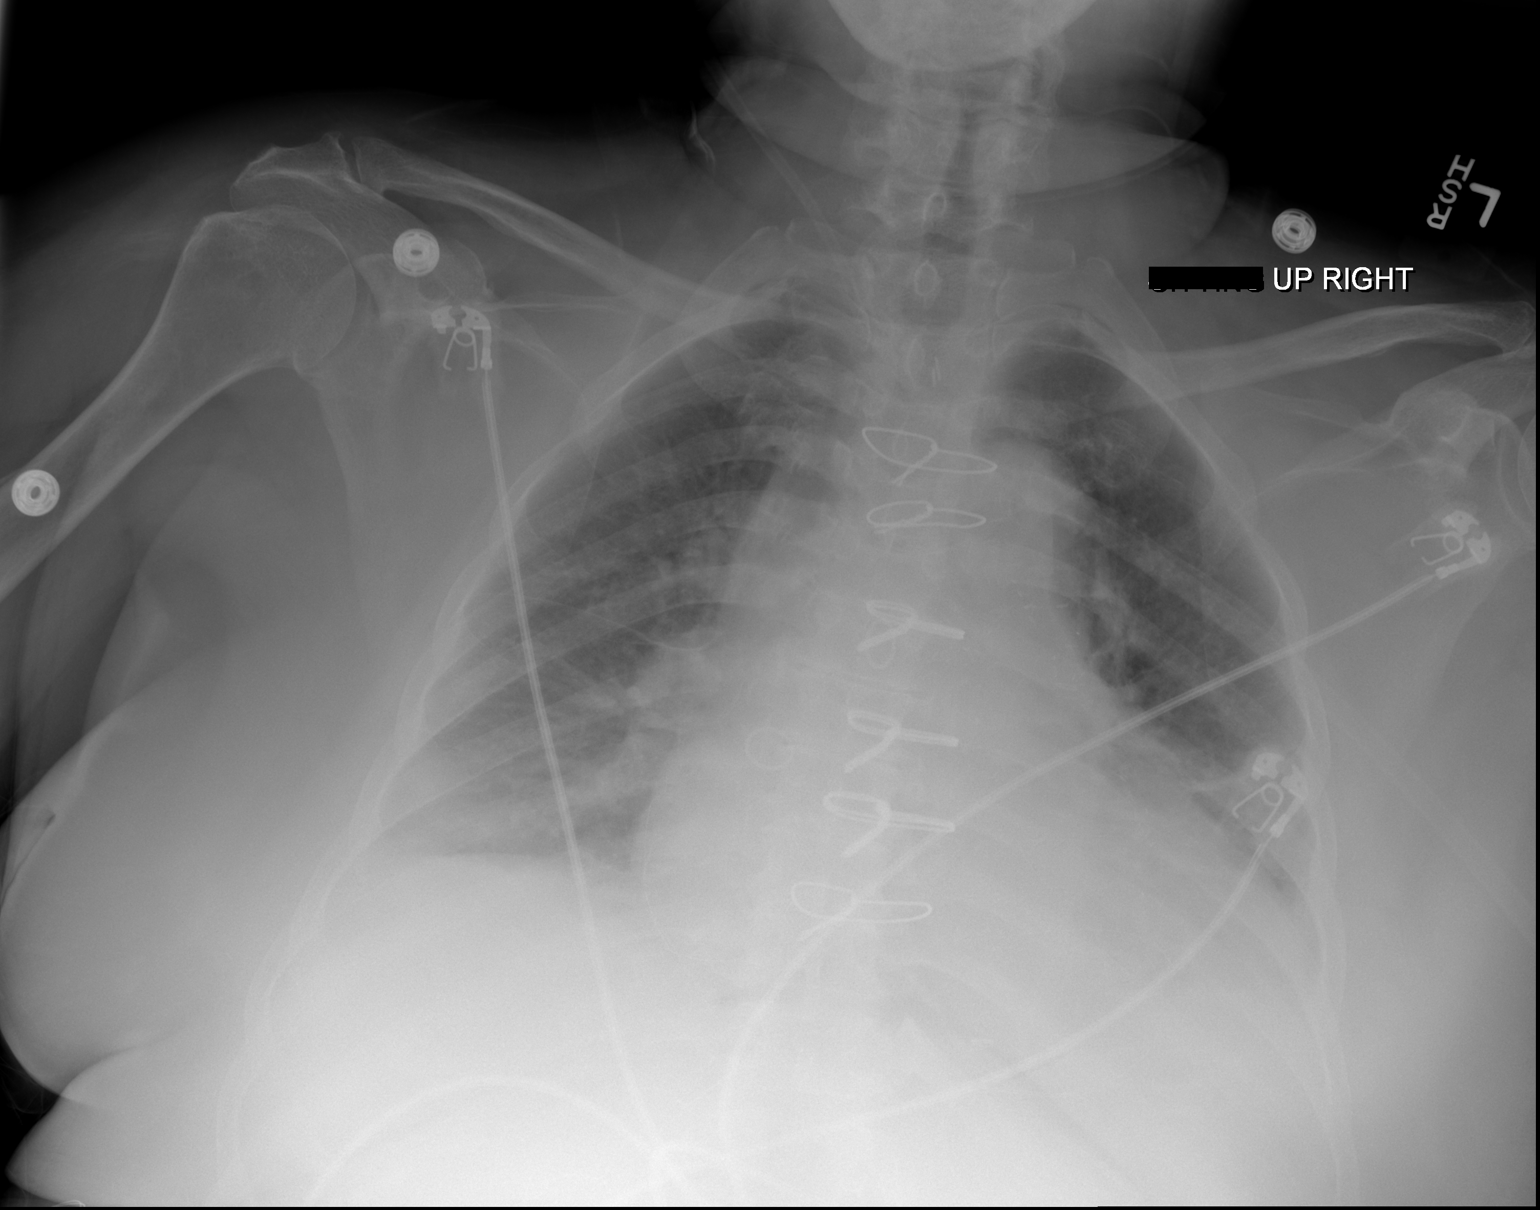

[w chest lat]
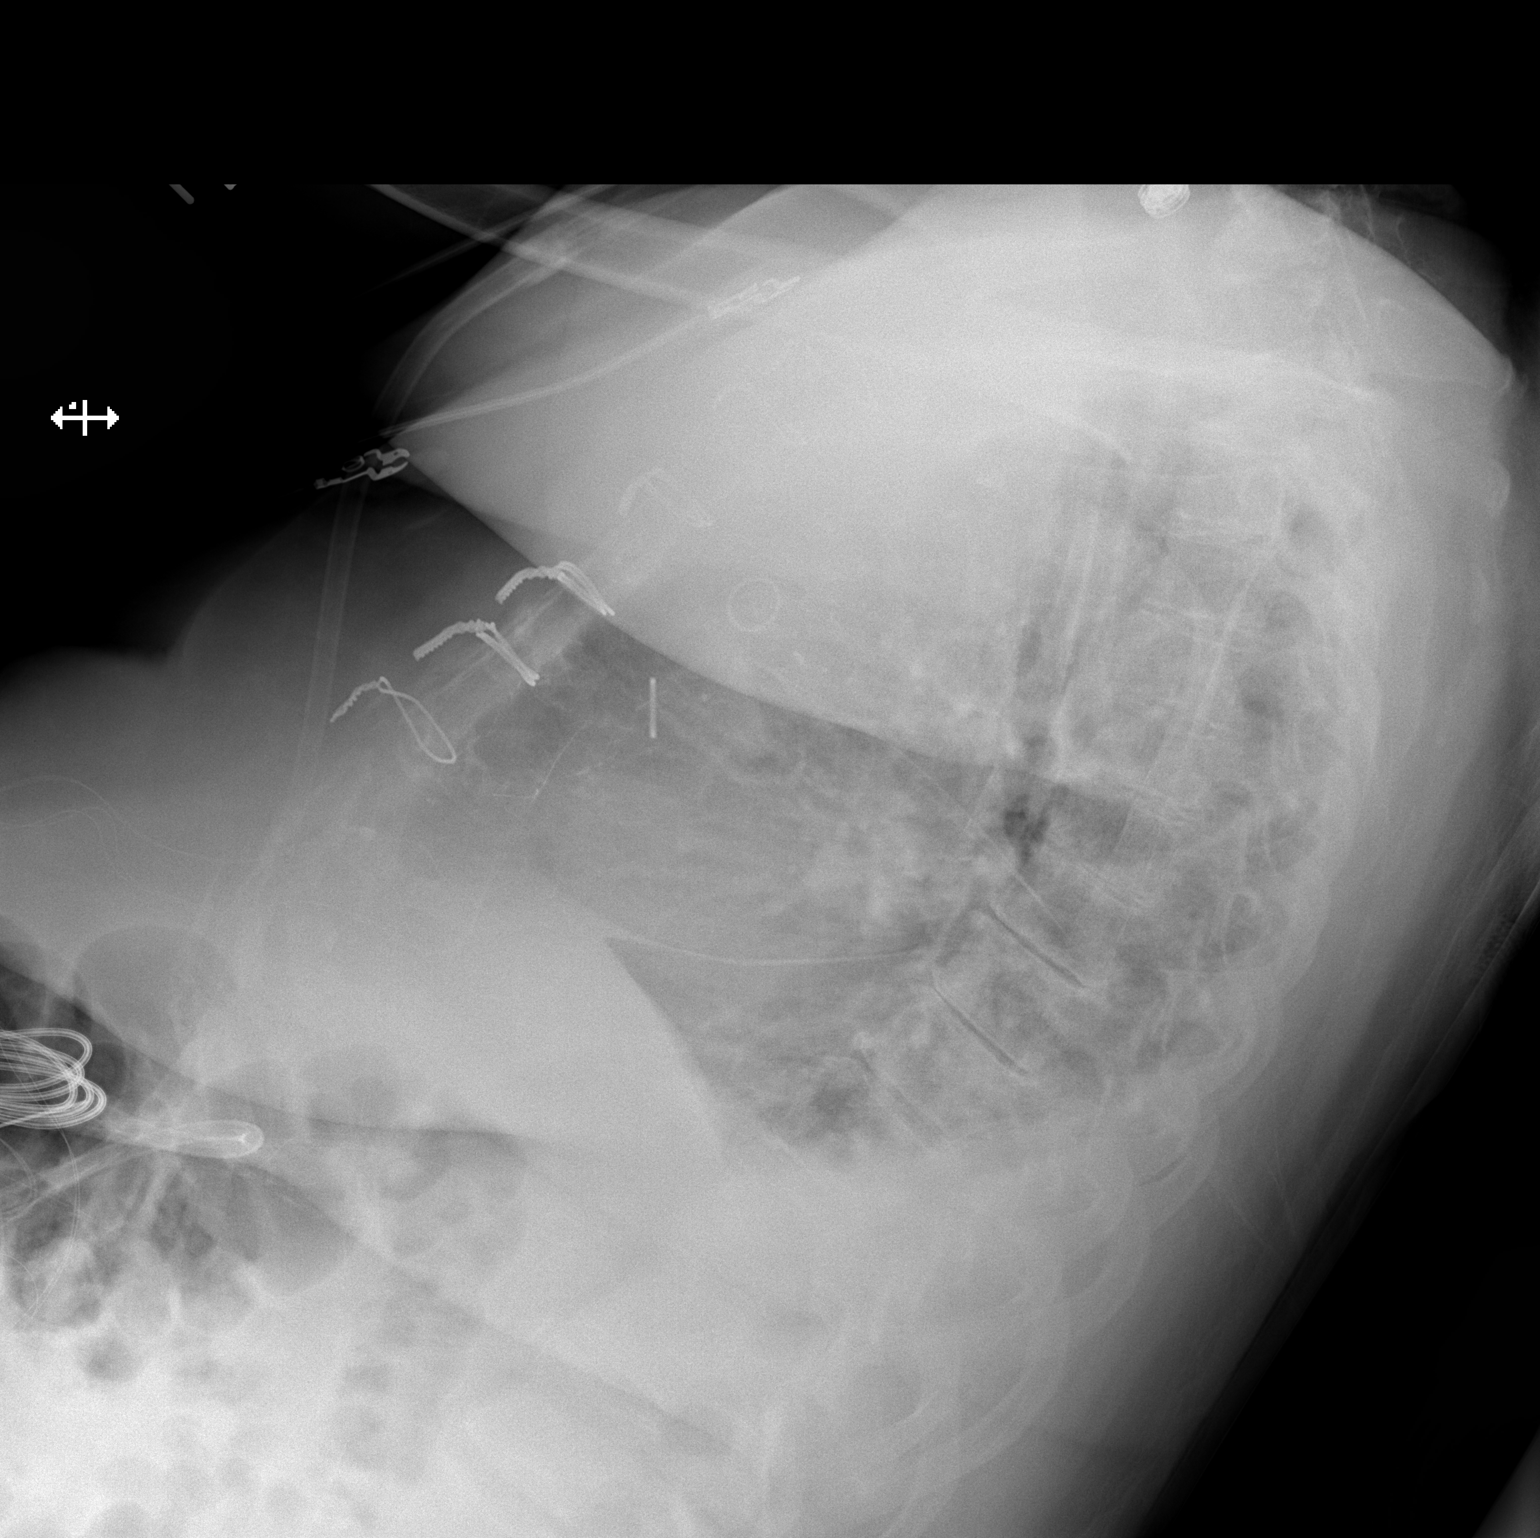

[2 of 2 positions shown; findings below may reference images not displayed]

FINDINGS: The cardiac silhouette remains enlarged. The central pulmonary
vascularity is prominent. The interstitial markings are also mildly
prominent. The retrocardiac region on the left is somewhat less
dense today. There remains partial obscuration of the left
hemidiaphragm. The sternal wires are intact. The right internal
jugular Cordis sheath is stable in position.
IMPRESSION: Findings suggest low-grade CHF. No pneumothorax, large pleural
effusion, nor definite pneumonia. Minimal left lower lobe
atelectasis is suspected.

## 2018-11-19 IMAGING — CR DG CHEST 2V
2 series · 2 of 2 positions shown · non-contrast
Comparison: 12/04/2016.

CLINICAL DATA: Followup CHF.

EXAM:
CHEST  2 VIEW

[chest lat]
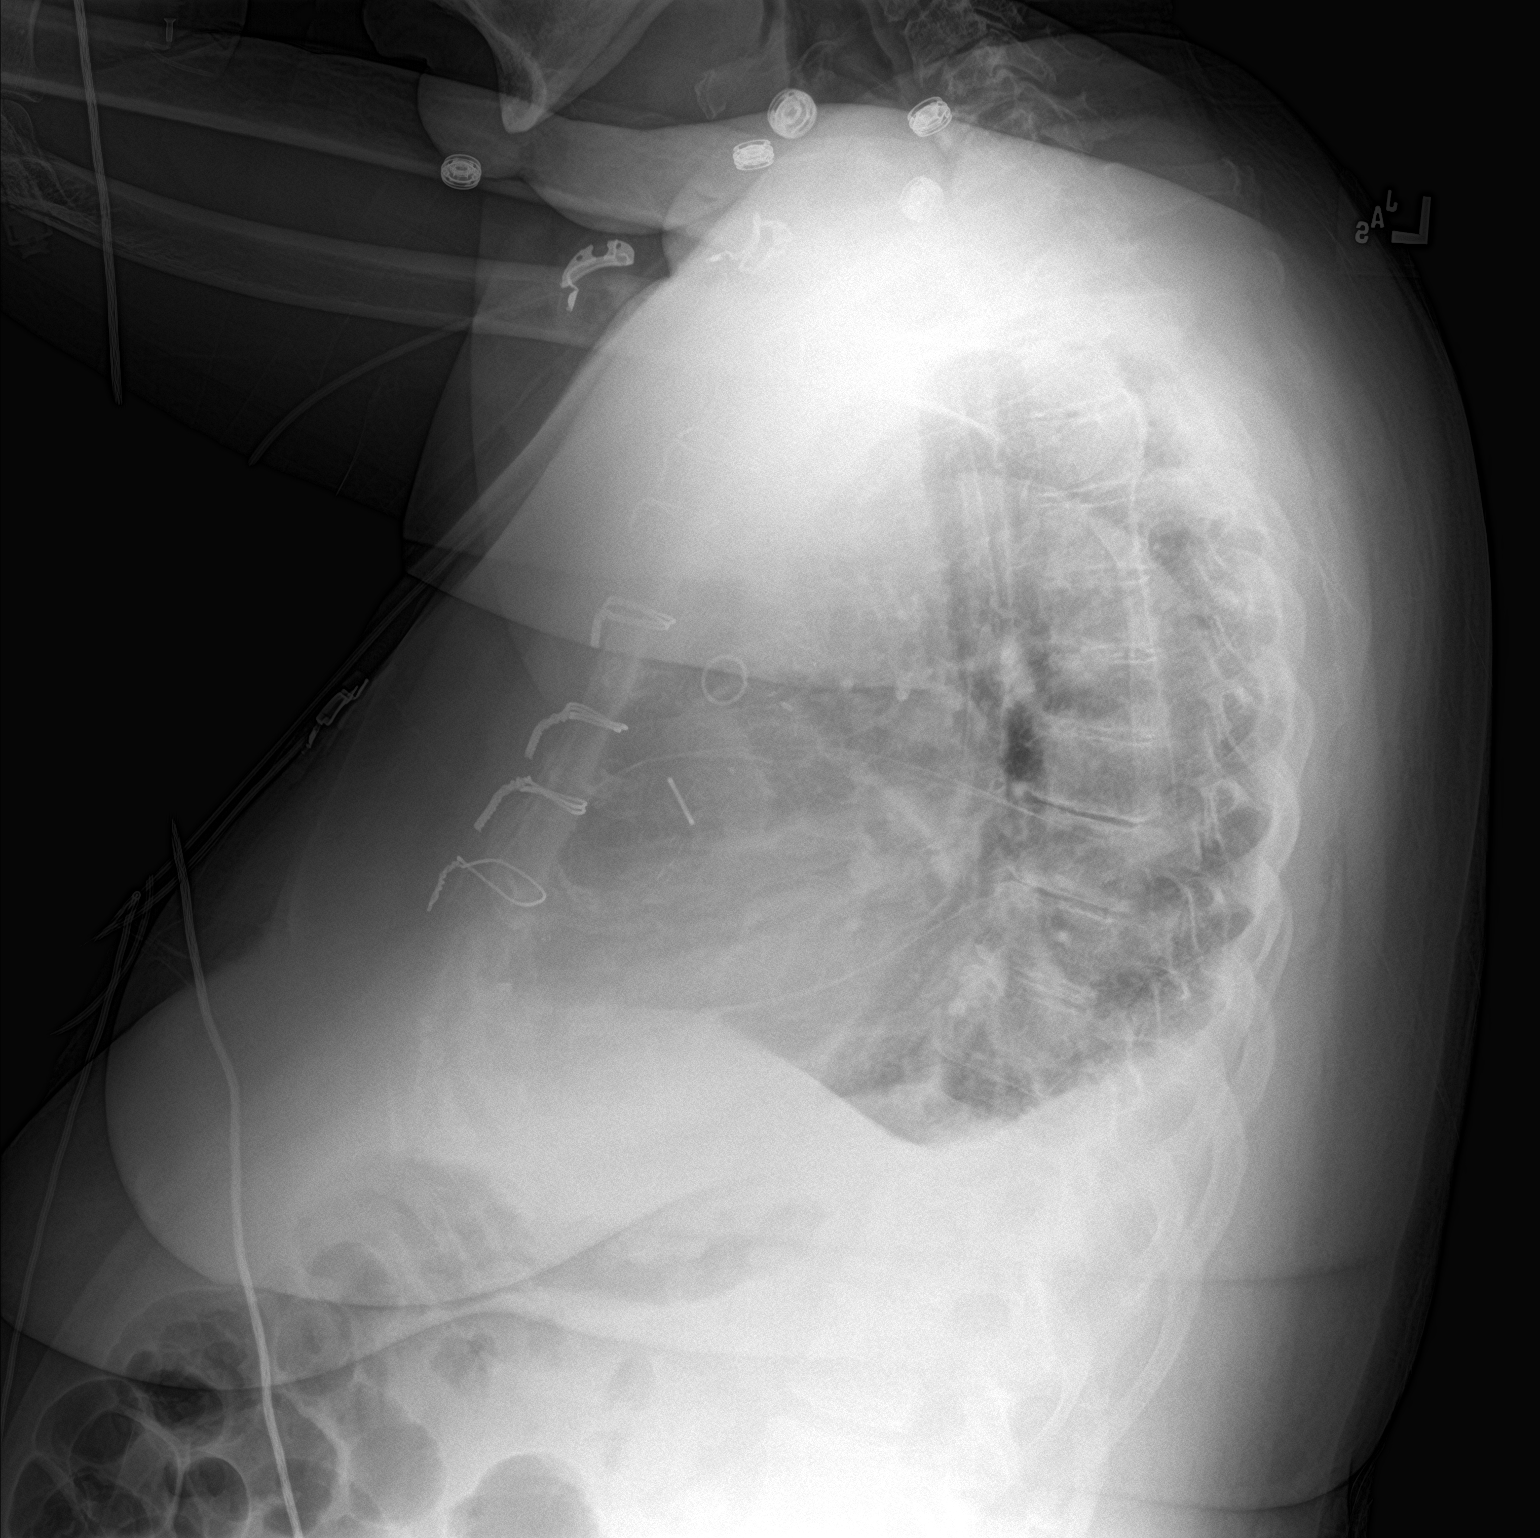

[chest ap]
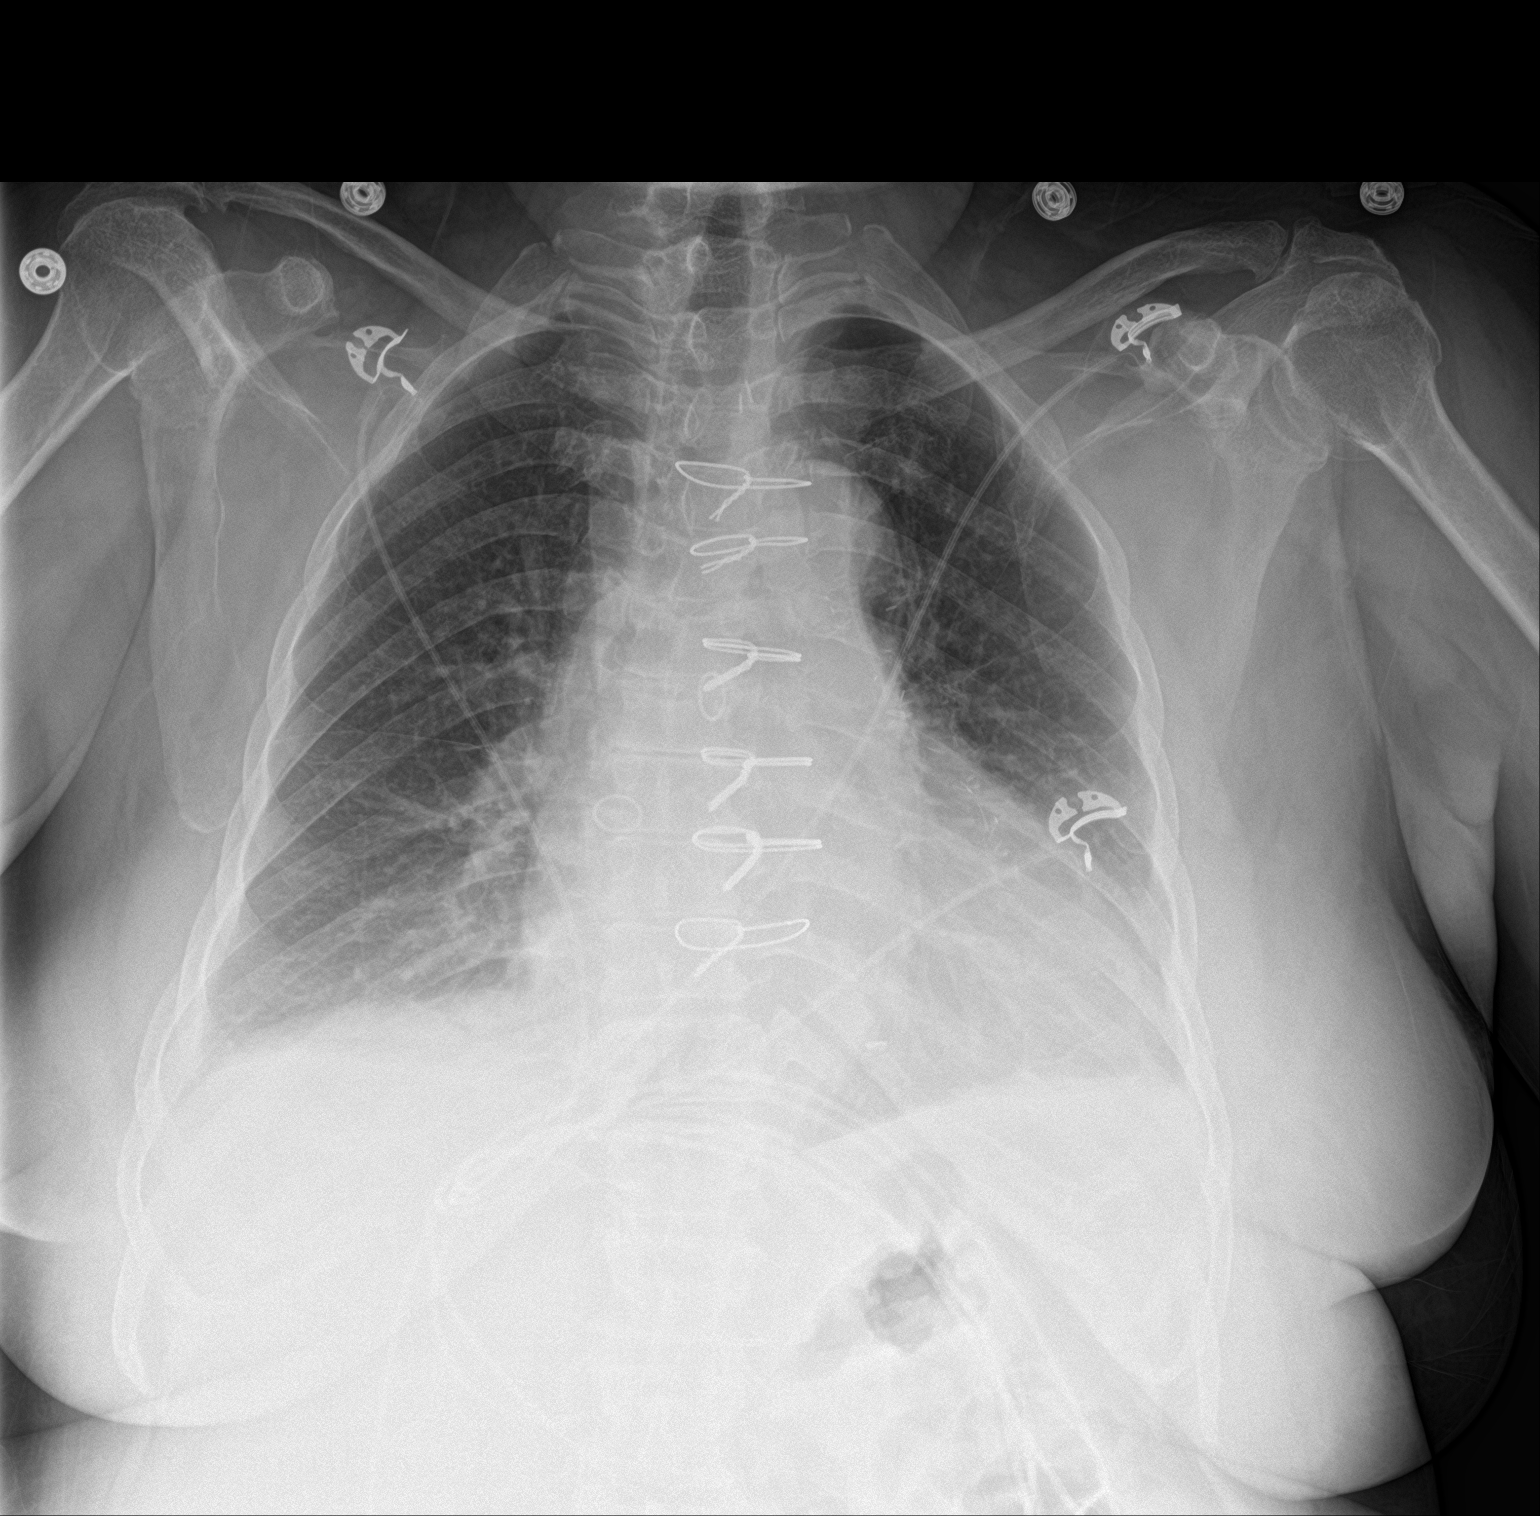

[2 of 2 positions shown; findings below may reference images not displayed]

FINDINGS: Stable enlarged cardiac silhouette and post CABG changes. Decreased
prominence of the pulmonary vasculature. Stable mild prominence of
the interstitial markings. Small right pleural effusion. Minimal
bibasilar linear atelectasis. Diffuse osteopenia.
IMPRESSION: 1. Resolved mild pulmonary vascular congestion.
2. Stable mild chronic interstitial lung disease and possible mild
interstitial pulmonary edema.
3. Small right pleural effusion.
4. Minimal bibasilar atelectasis.
5. Stable cardiomegaly.

## 2018-12-28 DIAGNOSIS — E1165 Type 2 diabetes mellitus with hyperglycemia: Secondary | ICD-10-CM | POA: Diagnosis not present

## 2018-12-28 DIAGNOSIS — E782 Mixed hyperlipidemia: Secondary | ICD-10-CM | POA: Diagnosis not present

## 2018-12-28 DIAGNOSIS — I10 Essential (primary) hypertension: Secondary | ICD-10-CM | POA: Diagnosis not present

## 2018-12-28 DIAGNOSIS — M179 Osteoarthritis of knee, unspecified: Secondary | ICD-10-CM | POA: Diagnosis not present

## 2018-12-28 DIAGNOSIS — Z6832 Body mass index (BMI) 32.0-32.9, adult: Secondary | ICD-10-CM | POA: Diagnosis not present

## 2018-12-28 DIAGNOSIS — G4762 Sleep related leg cramps: Secondary | ICD-10-CM | POA: Diagnosis not present

## 2019-03-31 DIAGNOSIS — G4762 Sleep related leg cramps: Secondary | ICD-10-CM | POA: Diagnosis not present

## 2019-03-31 DIAGNOSIS — E782 Mixed hyperlipidemia: Secondary | ICD-10-CM | POA: Diagnosis not present

## 2019-03-31 DIAGNOSIS — M179 Osteoarthritis of knee, unspecified: Secondary | ICD-10-CM | POA: Diagnosis not present

## 2019-03-31 DIAGNOSIS — Z1389 Encounter for screening for other disorder: Secondary | ICD-10-CM | POA: Diagnosis not present

## 2019-03-31 DIAGNOSIS — I10 Essential (primary) hypertension: Secondary | ICD-10-CM | POA: Diagnosis not present

## 2019-03-31 DIAGNOSIS — Z1231 Encounter for screening mammogram for malignant neoplasm of breast: Secondary | ICD-10-CM | POA: Diagnosis not present

## 2019-03-31 DIAGNOSIS — Z0000 Encounter for general adult medical examination without abnormal findings: Secondary | ICD-10-CM | POA: Diagnosis not present

## 2019-03-31 DIAGNOSIS — E1165 Type 2 diabetes mellitus with hyperglycemia: Secondary | ICD-10-CM | POA: Diagnosis not present

## 2019-04-01 DIAGNOSIS — Z1211 Encounter for screening for malignant neoplasm of colon: Secondary | ICD-10-CM | POA: Diagnosis not present

## 2019-04-12 DIAGNOSIS — R87610 Atypical squamous cells of undetermined significance on cytologic smear of cervix (ASC-US): Secondary | ICD-10-CM | POA: Diagnosis not present

## 2019-07-07 ENCOUNTER — Other Ambulatory Visit: Payer: Self-pay

## 2019-07-07 ENCOUNTER — Ambulatory Visit (INDEPENDENT_AMBULATORY_CARE_PROVIDER_SITE_OTHER): Payer: Medicare Other | Admitting: Cardiovascular Disease

## 2019-07-07 ENCOUNTER — Encounter: Payer: Self-pay | Admitting: Cardiovascular Disease

## 2019-07-07 VITALS — BP 92/60 | HR 87 | Ht 67.0 in | Wt 204.6 lb

## 2019-07-07 DIAGNOSIS — M179 Osteoarthritis of knee, unspecified: Secondary | ICD-10-CM | POA: Diagnosis not present

## 2019-07-07 DIAGNOSIS — Z951 Presence of aortocoronary bypass graft: Secondary | ICD-10-CM

## 2019-07-07 DIAGNOSIS — I10 Essential (primary) hypertension: Secondary | ICD-10-CM | POA: Diagnosis not present

## 2019-07-07 DIAGNOSIS — Z0000 Encounter for general adult medical examination without abnormal findings: Secondary | ICD-10-CM | POA: Diagnosis not present

## 2019-07-07 DIAGNOSIS — I25708 Atherosclerosis of coronary artery bypass graft(s), unspecified, with other forms of angina pectoris: Secondary | ICD-10-CM

## 2019-07-07 DIAGNOSIS — E785 Hyperlipidemia, unspecified: Secondary | ICD-10-CM | POA: Diagnosis not present

## 2019-07-07 DIAGNOSIS — G4762 Sleep related leg cramps: Secondary | ICD-10-CM | POA: Diagnosis not present

## 2019-07-07 DIAGNOSIS — E1165 Type 2 diabetes mellitus with hyperglycemia: Secondary | ICD-10-CM | POA: Diagnosis not present

## 2019-07-07 DIAGNOSIS — E782 Mixed hyperlipidemia: Secondary | ICD-10-CM | POA: Diagnosis not present

## 2019-07-07 NOTE — Patient Instructions (Addendum)
Medication Instructions:   Stop Amlodipine (Norvasc).  Stop Lasix (Furosemide).  Continue all other medications.    Labwork: none  Testing/Procedures: none  Follow-Up: Your physician wants you to follow up in:  1 year.  You will receive a reminder letter in the mail one-two months in advance.  If you don't receive a letter, please call our office to schedule the follow up appointment   Any Other Special Instructions Will Be Listed Below (If Applicable).  If you need a refill on your cardiac medications before your next appointment, please call your pharmacy.

## 2019-07-07 NOTE — Progress Notes (Signed)
SUBJECTIVE: The patient presents for follow-up of coronary artery disease.  I last evaluated her in August 2019.  She was diagnosed with an inferior ST elevation myocardial infarction in September 2018. She had multivessel CAD and ultimately underwent four-vessel CABG with a LIMA to LAD, SVG to Diagonal, SVG to OM2, and SVG to PDA.  Echocardiogram demonstrated normal left ventricular systolic function and regional wall motion, LVEF 60-65%, elevated filling pressures, and mild aortic and mitral regurgitation.  She had some mild plaque disease in the internal carotid arteries.  The patient denies any symptoms of chest pain, palpitations, shortness of breath, lightheadedness, dizziness, leg swelling, orthopnea, PND, and syncope.  She was started on lisinopril by her PCP and she has "cold spells "afterwards.  Her blood pressure is low today.  She does not get much exercise but is able to perform her activities of daily living without difficulty.  I personally reviewed ECG performed today which demonstrates sinus rhythm with right bundle branch block.    Review of Systems: As per "subjective", otherwise negative.  No Known Allergies  Current Outpatient Medications  Medication Sig Dispense Refill  . amLODipine (NORVASC) 5 MG tablet TAKE 1 TABLET EVERY DAY 90 tablet 0  . aspirin EC 81 MG tablet Take 1 tablet (81 mg total) by mouth daily.    Marland Kitchen atorvastatin (LIPITOR) 80 MG tablet TAKE 1 TABLET (80 MG TOTAL) BY MOUTH DAILY AT 6 PM. 90 tablet 0  . chlorthalidone (HYGROTON) 25 MG tablet Take 25 mg by mouth daily.    . Cholecalciferol (VITAMIN D3 PO) Take 1 tablet by mouth daily.    . Cyanocobalamin (VITAMIN B 12 PO) Take 1 tablet by mouth daily.    . furosemide (LASIX) 40 MG tablet Take 40 mg by mouth as needed.    Marland Kitchen lisinopril (ZESTRIL) 10 MG tablet Take 10 mg by mouth daily.    . metFORMIN (GLUCOPHAGE) 1000 MG tablet Take 1,000 mg by mouth daily with breakfast.    . metoprolol  tartrate (LOPRESSOR) 50 MG tablet TAKE 1 AND 1/2 TABLETS TWICE DAILY 270 tablet 0  . Omega-3 1000 MG CAPS Take 1 capsule by mouth daily.     . potassium chloride SA (KLOR-CON) 20 MEQ tablet Take 10 mEq by mouth daily.    Marland Kitchen tiZANidine (ZANAFLEX) 2 MG tablet Take 2 mg by mouth at bedtime as needed.     No current facility-administered medications for this visit.    Past Medical History:  Diagnosis Date  . CAD, multiple vessel 11/27/2016   pRCA 60% &55%, dRCA 95% & 90% (very tortuous); ost LAD 65%, mLAD 90%, Ost D1 50% &60%, OM2 60%.  EF 55-65%, High LVEDP 32-35%, Mod (3+) MR  . Chronic diastolic heart failure ([place]) 11/27/2016  . Colon polyps   . Essential hypertension 11/27/2016  . Hx of CABG 12/01/2016   LIMA-LAD, SVG-D1, SVG-OM2, SVG-rPDA  . Hyperlipidemia with target low density lipoprotein (LDL) cholesterol less than 70 mg/dL 11/27/2016  . Long-time heavy smoker    1 PPD x >40 yr  . ST elevation myocardial infarction (STEMI) of inferior wall ([place]) 11/27/2016   - Aborted STEMI - EKG STE resolved after ASA & Heparin; MV CAD    Past Surgical History:  Procedure Laterality Date  . ABDOMINAL HYSTERECTOMY    . CORONARY ARTERY BYPASS GRAFT N/A 12/01/2016   Procedure: CORONARY ARTERY BYPASS GRAFTING (CABG) x four , using left internal mammary artery and right leg greater saphenous vein harvested  endoscopically - LIMA to LAD, -SVG to DIAG1, SVG to OM2, SVG to PDA ;  Surgeon: Melrose Nakayama, MD;  Location: [facility];  Service: Open Heart Surgery;  Laterality: N/A;  . INTRAOPERATIVE TRANSESOPHAGEAL ECHOCARDIOGRAM N/A 12/01/2016   Procedure: INTRAOPERATIVE TRANSESOPHAGEAL ECHOCARDIOGRAM;  Surgeon: Melrose Nakayama, MD;  Location[facility];  Service: Open Heart Surgery: EF 55-65%. No RWMA. MIld AI-- (post CABG, EF ~50%, Mod AI. MIld MR  . LEFT HEART CATH AND CORONARY ANGIOGRAPHY N/A 11/27/2016   Procedure: LEFT HEART CATH AND CORONARY ANGIOGRAPHY;  Surgeon: Leonie Man, MD;  Location: [facility]  INVASIVE CV LAB: pRCA 60% &55%, dRCA 95% & 90% (very tortuous); ost LAD 65%, mLAD 90%, Ost D1 50% &60%, OM2 60%.  EF 55-65%, High LVEDP 32-35%, Mod (3+) MR  . TRANSTHORACIC ECHOCARDIOGRAM  11/27/2016   EF 60-65% but no RWMA. Diastolic dysfunction noted but not calculated    Social History   Socioeconomic History  . Marital status: Unknown    Spouse name: Not on file  . Number of children: Not on file  . Years of education: Not on file  . Highest education level: Not on file  Occupational History  . Not on file  Tobacco Use  . Smoking status: Former Smoker    Packs/day: 1.00    Years: 42.00    Pack years: 42.00    Types: Cigarettes    Quit date: 01/07/2017    Years since quitting: 2.4  . Smokeless tobacco: Never Used  Substance and Sexual Activity  . Alcohol use: No  . Drug use: No  . Sexual activity: Not on file  Other Topics Concern  . Not on file  Social History Narrative  . Not on file   Social Determinants of Health   Financial Resource Strain:   . Difficulty of Paying Living Expenses:   Food Insecurity:   . Worried About Charity fundraiser in the Last Year:   . Arboriculturist in the Last Year:   Transportation Needs:   . Film/video editor (Medical):   Marland Kitchen Lack of Transportation (Non-Medical):   Physical Activity:   . Days of Exercise per Week:   . Minutes of Exercise per Session:   Stress:   . Feeling of Stress :   Social Connections:   . Frequency of Communication with Friends and Family:   . Frequency of Social Gatherings with Friends and Family:   . Attends Religious Services:   . Active Member of Clubs or Organizations:   . Attends Archivist Meetings:   Marland Kitchen Marital Status:   Intimate Partner Violence:   . Fear of Current or Ex-Partner:   . Emotionally Abused:   Marland Kitchen Physically Abused:   . Sexually Abused:     Orson Slick, LPN was present throughout the entirety of the encounter.  Vitals:   07/07/19 1007  BP: 92/60  Pulse: 87    SpO2: 97%  Weight: 204 lb 9.6 oz (92.8 kg)  Height: 5\' 7"  (1.702 m)    Wt Readings from Last 3 Encounters:  07/07/19 204 lb 9.6 oz (92.8 kg)  10/13/17 216 lb (98 kg)  06/08/17 206 lb (93.4 kg)     PHYSICAL EXAM General: NAD HEENT: Normal. Neck: No JVD, no thyromegaly. Lungs: Clear to auscultation bilaterally with normal respiratory effort. CV: Regular rate and rhythm, normal S1/S2, no S3/S4, no murmur. No pretibial or periankle edema.  No carotid bruit.   Abdomen: Soft, nontender, no distention.  Neurologic: Alert and oriented.  Psych: Normal affect. Skin: Normal. Musculoskeletal: No gross deformities.      Labs: Lab Results  Component Value Date/Time   K 4.2 12/29/2016 11:40 AM   BUN 16 12/29/2016 11:40 AM   CREATININE 1.01 (H) 12/29/2016 11:40 AM   CREATININE 0.89 12/19/2016 01:28 PM   ALT 12 12/29/2016 11:40 AM   HGB 11.4 12/29/2016 11:40 AM     Lipids: Lab Results  Component Value Date/Time   LDLCALC 54 12/29/2016 11:40 AM   CHOL 117 12/29/2016 11:40 AM   TRIG 108 12/29/2016 11:40 AM   HDL 41 12/29/2016 11:40 AM      Prior CV studies:   The following studies were reviewed today:  Cardiac Catheterization: 11/2016  Prox RCA-2 lesion, 60 %stenosed. Prox-mid RCA-1 lesion, 55 %stenosed.  Dist RCA-2 lesion, 95 %stenosed. Dist RCA-1 lesion, 90 %stenosed. - This combination is likely culprit lesion, however with very tortuous RCA in a stable patient did not feel it was a good idea to attempt PTCA at this time.  Ost LAD lesion, 65 %stenosed. Mid LAD lesion, 90 %stenosed.  Ost 1st Diag to 1st Diag lesion, 50 %stenosed. 1st Diag lesion, 60 %stenosed.  Dist LAD lesion, 50 %stenosed. - This is beyond the 2nd Diag branch.  2nd Mrg lesion, 60 %stenosed.  The left ventricular systolic function is normal. The left ventricular ejection fraction is 55-65% by visual estimate.  LV end diastolic pressure is severely elevated. 32-35 mmhg  There is moderate (3+)  mitral regurgitation.  Very difficult situation with the patient has severe 2-3 vessel disease involving extensive disease in the distal RCA as well as ostial proximal and mid LAD. As she is now chest pain-free with resolution of her ST elevations, I did not feel like emergent attempted PCI on the RCA was a prudent choice of action. The tortuosity of the vessel and the eccentric nature of the lesions themselves make it a somewhat unfavorable vessel for PCI albeit possible if necessary.  The combination of the RCA in the extensive disease in the LAD begins a question of whether she would potentially benefit from bypass surgery. I have contacted Dr. Roxan Hockey from [facility] and asked him to see the patient. I was informed that since the patient was not having active chest pain he can the patient will be seen tomorrow. There would be no OR date for tomorrow, so therefore can consider "cooling down over the weekend "with plan for next week.  With elevated LVEDP, concern for acute diastolic heart failure, I decided against balloon pump for afterload reduction in light of her likely not going for urgent surgery anytime soon.   Plan:   Admit to CCU. TR band removal.   Afterload/preload reduction with nitroglycerin drip   Will initiate Aggrastat infusion followed by heparin -- plan Aggrastat for at least 18 hours  Reassess in the morning.   Check an echocardiogram based on concern for murmur on exam and MR on LV gram. Cannot exclude that this was catheter related however.  I will start carvedilol and high-dose atorvastatin  Check lipid panel and A1c  Provided she stays stable overnight, I think we can avoid any further invasive procedures on her today.  Pending CT consultation and review of films by interventional colleagues would determine whether we will proceed with multivessel PCI versus bypass surgery.     ASSESSMENT AND PLAN:  1. Coronary artery disease: She has a history of  inferior STEMI status post four-vessel CABG  in September 2018 (LIMA-LAD, SVG-D1, SVG-OM2, and SVG-PDA). Symptomatically stable. Continue aspirin, atorvastatin, andmetoprolol tartrate.  2. Hyperlipidemia: Goal LDL less than 70. Continue high intensity statin therapy with atorvastatin 80 mg daily.  3. Hypertension: BP is low normal.  I will stop amlodipine.    Disposition: Follow up 1 year   Kate Sable, M.D., F.A.C.C.

## 2019-07-08 ENCOUNTER — Ambulatory Visit: Payer: Medicare HMO | Admitting: Cardiovascular Disease

## 2019-10-06 DIAGNOSIS — E782 Mixed hyperlipidemia: Secondary | ICD-10-CM | POA: Diagnosis not present

## 2019-10-06 DIAGNOSIS — L918 Other hypertrophic disorders of the skin: Secondary | ICD-10-CM | POA: Diagnosis not present

## 2019-10-06 DIAGNOSIS — I10 Essential (primary) hypertension: Secondary | ICD-10-CM | POA: Diagnosis not present

## 2019-10-06 DIAGNOSIS — G4762 Sleep related leg cramps: Secondary | ICD-10-CM | POA: Diagnosis not present

## 2019-10-06 DIAGNOSIS — E1165 Type 2 diabetes mellitus with hyperglycemia: Secondary | ICD-10-CM | POA: Diagnosis not present

## 2020-01-04 DIAGNOSIS — E1165 Type 2 diabetes mellitus with hyperglycemia: Secondary | ICD-10-CM | POA: Diagnosis not present

## 2020-01-04 DIAGNOSIS — L918 Other hypertrophic disorders of the skin: Secondary | ICD-10-CM | POA: Diagnosis not present

## 2020-01-04 DIAGNOSIS — I10 Essential (primary) hypertension: Secondary | ICD-10-CM | POA: Diagnosis not present

## 2020-01-04 DIAGNOSIS — E782 Mixed hyperlipidemia: Secondary | ICD-10-CM | POA: Diagnosis not present

## 2020-01-04 DIAGNOSIS — G4762 Sleep related leg cramps: Secondary | ICD-10-CM | POA: Diagnosis not present

## 2020-01-04 DIAGNOSIS — M179 Osteoarthritis of knee, unspecified: Secondary | ICD-10-CM | POA: Diagnosis not present

## 2020-04-18 DIAGNOSIS — E782 Mixed hyperlipidemia: Secondary | ICD-10-CM | POA: Diagnosis not present

## 2020-04-18 DIAGNOSIS — I10 Essential (primary) hypertension: Secondary | ICD-10-CM | POA: Diagnosis not present

## 2020-04-18 DIAGNOSIS — E1143 Type 2 diabetes mellitus with diabetic autonomic (poly)neuropathy: Secondary | ICD-10-CM | POA: Diagnosis not present

## 2020-04-18 DIAGNOSIS — G4762 Sleep related leg cramps: Secondary | ICD-10-CM | POA: Diagnosis not present

## 2020-04-18 DIAGNOSIS — Z0000 Encounter for general adult medical examination without abnormal findings: Secondary | ICD-10-CM | POA: Diagnosis not present

## 2020-04-18 DIAGNOSIS — M179 Osteoarthritis of knee, unspecified: Secondary | ICD-10-CM | POA: Diagnosis not present

## 2020-06-13 DIAGNOSIS — N1832 Chronic kidney disease, stage 3b: Secondary | ICD-10-CM | POA: Diagnosis not present

## 2020-06-13 DIAGNOSIS — R072 Precordial pain: Secondary | ICD-10-CM | POA: Diagnosis not present

## 2020-06-13 DIAGNOSIS — I129 Hypertensive chronic kidney disease with stage 1 through stage 4 chronic kidney disease, or unspecified chronic kidney disease: Secondary | ICD-10-CM | POA: Diagnosis not present

## 2020-06-13 DIAGNOSIS — E782 Mixed hyperlipidemia: Secondary | ICD-10-CM | POA: Diagnosis not present

## 2020-06-13 DIAGNOSIS — I517 Cardiomegaly: Secondary | ICD-10-CM | POA: Diagnosis not present

## 2020-06-13 DIAGNOSIS — R079 Chest pain, unspecified: Secondary | ICD-10-CM | POA: Diagnosis not present

## 2020-06-13 DIAGNOSIS — E1122 Type 2 diabetes mellitus with diabetic chronic kidney disease: Secondary | ICD-10-CM | POA: Diagnosis not present

## 2020-06-13 DIAGNOSIS — Z951 Presence of aortocoronary bypass graft: Secondary | ICD-10-CM | POA: Diagnosis not present

## 2020-06-13 DIAGNOSIS — I4510 Unspecified right bundle-branch block: Secondary | ICD-10-CM | POA: Diagnosis not present

## 2020-06-13 DIAGNOSIS — I252 Old myocardial infarction: Secondary | ICD-10-CM | POA: Diagnosis not present

## 2020-06-13 DIAGNOSIS — Z87891 Personal history of nicotine dependence: Secondary | ICD-10-CM | POA: Diagnosis not present

## 2020-06-18 DIAGNOSIS — N189 Chronic kidney disease, unspecified: Secondary | ICD-10-CM | POA: Diagnosis not present

## 2020-06-18 DIAGNOSIS — M179 Osteoarthritis of knee, unspecified: Secondary | ICD-10-CM | POA: Diagnosis not present

## 2020-06-18 DIAGNOSIS — Z0000 Encounter for general adult medical examination without abnormal findings: Secondary | ICD-10-CM | POA: Diagnosis not present

## 2020-06-18 DIAGNOSIS — E1143 Type 2 diabetes mellitus with diabetic autonomic (poly)neuropathy: Secondary | ICD-10-CM | POA: Diagnosis not present

## 2020-06-18 DIAGNOSIS — E782 Mixed hyperlipidemia: Secondary | ICD-10-CM | POA: Diagnosis not present

## 2020-06-18 DIAGNOSIS — I10 Essential (primary) hypertension: Secondary | ICD-10-CM | POA: Diagnosis not present

## 2020-07-18 ENCOUNTER — Encounter: Payer: Self-pay | Admitting: *Deleted

## 2020-07-18 NOTE — Progress Notes (Signed)
Cardiology Office Note  Date: 07/19/2020   ID: [name], DOB 1951/02/09, MRN [id number]  PCP:  Neale Burly, MD  Cardiologist:  None Electrophysiologist:  None   Chief Complaint: 1 year cardiac follow up  History of Present Illness: [name] is a 70 y.o. female with a history of CAD, CABG x 4, STEMI, Chronic diastolic HF, HTN, HLD, long time heavy smoker.  History of inferior STEMI September 2018 with multivessel CAD with subsequent four-vessel CABG with LIMA-LAD, SVG-diagonal, SVG-OM 2, SVG-PDA.  Echocardiogram with normal left ventricular systolic function.  EF of 60 to 65%.  Elevated filling pressures, mild aortic and mitral regurgitation.  Mild plaque in internal carotid arteries.  Last seen by Dr. Bronson Ing 07/07/2019.  She denied symptoms of chest pain, palpitations, shortness of breath, lightheadedness, dizziness, edema, orthopnea, PND, syncope.  She was symptomatically stable.  She was continuing aspirin, atorvastatin, metoprolol.  Her amlodipine was stopped due to low blood pressures.  On 06/13/2020 she presented to [facility] ER with precordial chest pain and left sided neck discomfort. Also had lightheadedness, nausea, and diaphoresis. She was ruled out for ACS and discharged  She currently denies any anginal or exertional symptoms.  She states she decreased her dosage metoprolol down to 50 mg once a day due to low blood pressures.  She was continuing her lisinopril 10 mg daily and chlorthalidone 25 mg daily.  She had also cut down on her atorvastatin due to a family member who suggested it could affect her kidney function.  Her blood pressure is on the low side today at 98/62.  She states she took her antihypertensive medications around 11 today instead of earlier in the morning.  She denies any lightheadedness, dizziness, presyncopal or syncopal episodes.  Denies any palpitations or arrhythmias.  She apparently has not taking her metformin as directed either.  She  currently denies any chest pain since her presentation to [facility] on 06/13/2020.  Does complain of some DOE/dyspnea and has to stop more often due to fatigue when at work.  Past Medical History:  Diagnosis Date  . CAD, multiple vessel 11/27/2016   pRCA 60% &55%, dRCA 95% & 90% (very tortuous); ost LAD 65%, mLAD 90%, Ost D1 50% &60%, OM2 60%.  EF 55-65%, High LVEDP 32-35%, Mod (3+) MR  . Chronic diastolic heart failure ([place]) 11/27/2016  . Colon polyps   . Essential hypertension 11/27/2016  . Hx of CABG 12/01/2016   LIMA-LAD, SVG-D1, SVG-OM2, SVG-rPDA  . Hyperlipidemia with target low density lipoprotein (LDL) cholesterol less than 70 mg/dL 11/27/2016  . Long-time heavy smoker    1 PPD x >40 yr  . ST elevation myocardial infarction (STEMI) of inferior wall ([place]) 11/27/2016   - Aborted STEMI - EKG STE resolved after ASA & Heparin; MV CAD    Past Surgical History:  Procedure Laterality Date  . ABDOMINAL HYSTERECTOMY    . CORONARY ARTERY BYPASS GRAFT N/A 12/01/2016   Procedure: CORONARY ARTERY BYPASS GRAFTING (CABG) x four , using left internal mammary artery and right leg greater saphenous vein harvested endoscopically - LIMA to LAD, -SVG to DIAG1, SVG to OM2, SVG to PDA ;  Surgeon: Melrose Nakayama, MD;  Location: [facility];  Service: Open Heart Surgery;  Laterality: N/A;  . INTRAOPERATIVE TRANSESOPHAGEAL ECHOCARDIOGRAM N/A 12/01/2016   Procedure: INTRAOPERATIVE TRANSESOPHAGEAL ECHOCARDIOGRAM;  Surgeon: Melrose Nakayama, MD;  Location: [facility] OR;  Service: Open Heart Surgery: EF 55-65%. No RWMA. MIld AI-- (post CABG, EF ~  50%, Mod AI. MIld MR  . LEFT HEART CATH AND CORONARY ANGIOGRAPHY N/A 11/27/2016   Procedure: LEFT HEART CATH AND CORONARY ANGIOGRAPHY;  Surgeon: Leonie Man, MD;  Location: [facility] INVASIVE CV LAB: pRCA 60% &55%, dRCA 95% & 90% (very tortuous); ost LAD 65%, mLAD 90%, Ost D1 50% &60%, OM2 60%.  EF 55-65%, High LVEDP 32-35%, Mod (3+) MR  . TRANSTHORACIC ECHOCARDIOGRAM   11/27/2016   EF 60-65% but no RWMA. Diastolic dysfunction noted but not calculated    Current Outpatient Medications  Medication Sig Dispense Refill  . Ascorbic Acid (VITAMIN C) 1000 MG tablet Take 1,000 mg by mouth daily.    Marland Kitchen aspirin EC 81 MG tablet Take 1 tablet (81 mg total) by mouth daily.    Marland Kitchen atorvastatin (LIPITOR) 80 MG tablet TAKE 1 TABLET (80 MG TOTAL) BY MOUTH DAILY AT 6 PM. (Patient taking differently: Take 40 mg by mouth daily at 6 PM.) 90 tablet 0  . chlorthalidone (HYGROTON) 25 MG tablet Take 25 mg by mouth daily.    . Cholecalciferol (VITAMIN D3 PO) Take 1 tablet by mouth daily.    . Cyanocobalamin (VITAMIN B 12 PO) Take 1 tablet by mouth daily.    . metFORMIN (GLUCOPHAGE) 500 MG tablet Take 500 mg by mouth daily.    . Omega-3 1000 MG CAPS Take 1 capsule by mouth 2 (two) times daily.    . potassium chloride SA (KLOR-CON) 20 MEQ tablet Take 10 mEq by mouth daily.    Marland Kitchen tiZANidine (ZANAFLEX) 2 MG tablet Take 2 mg by mouth at bedtime as needed.    . Zinc Oxide-Vitamin C (ZINC PLUS VITAMIN C PO) Take 1 tablet by mouth. occasionally    . lisinopril (ZESTRIL) 5 MG tablet Take 1 tablet (5 mg total) by mouth daily. 90 tablet 3  . metoprolol tartrate (LOPRESSOR) 25 MG tablet Take 1 tablet (25 mg total) by mouth 2 (two) times daily. 180 tablet 3   No current facility-administered medications for this visit.   Allergies:  Patient has no known allergies.   Social History: The patient  reports that she quit smoking about 3 years ago. Her smoking use included cigarettes. She has a 42.00 pack-year smoking history. She has never used smokeless tobacco. She reports that she does not drink alcohol and does not use drugs.   Family History: The patient's family history includes Heart disease in her brother.   ROS:  Please see the history of present illness. Otherwise, complete review of systems is positive for none.  All other systems are reviewed and negative.   Physical Exam: VS:  BP  98/62   Pulse 89   Ht 5\' 7"  (1.702 m)   Wt 205 lb 9.6 oz (93.3 kg)   SpO2 95%   BMI 32.20 kg/m , BMI Body mass index is 32.2 kg/m.  Wt Readings from Last 3 Encounters:  07/19/20 205 lb 9.6 oz (93.3 kg)  07/07/19 204 lb 9.6 oz (92.8 kg)  10/13/17 216 lb (98 kg)    General: Patient appears comfortable at rest. Neck: Supple, no elevated JVP or carotid bruits, no thyromegaly. Lungs: Clear to auscultation, nonlabored breathing at rest. Cardiac: Regular rate and rhythm, no S3 or significant systolic murmur, no pericardial rub. Extremities: No pitting edema, distal pulses 2+. Skin: Warm and dry. Musculoskeletal: No kyphosis. Neuropsychiatric: Alert and oriented x3, affect grossly appropriate.  ECG:  EKG at recent visit to [facility] on 06/13/2020 demonstrated normal sinus rhythm rate of 79.  RBBB, inferior infarct, age undetermined.  Recent Labwork: No results found for requested labs within last 8760 hours.     Component Value Date/Time   CHOL 117 12/29/2016 1140   TRIG 108 12/29/2016 1140   HDL 41 12/29/2016 1140   CHOLHDL 2.9 12/29/2016 1140   CHOLHDL 3.8 11/27/2016 2115   VLDL 20 11/27/2016 2115   LDLCALC 54 12/29/2016 1140    Other Studies Reviewed Today:  Cardiac Catheterization: 11/2016  Prox RCA-2 lesion, 60 %stenosed. Prox-mid RCA-1 lesion, 55 %stenosed.  Dist RCA-2 lesion, 95 %stenosed. Dist RCA-1 lesion, 90 %stenosed. - This combination is likely culprit lesion, however with very tortuous RCA in a stable patient did not feel it was a good idea to attempt PTCA at this time.  Ost LAD lesion, 65 %stenosed. Mid LAD lesion, 90 %stenosed.  Ost 1st Diag to 1st Diag lesion, 50 %stenosed. 1st Diag lesion, 60 %stenosed.  Dist LAD lesion, 50 %stenosed. - This is beyond the 2nd Diag branch.  2nd Mrg lesion, 60 %stenosed.  The left ventricular systolic function is normal. The left ventricular ejection fraction is 55-65% by visual estimate.  LV end diastolic pressure  is severely elevated. 32-35 mmhg  There is moderate (3+) mitral regurgitation.  Very difficult situation with the patient has severe 2-3 vessel disease involving extensive disease in the distal RCA as well as ostial proximal and mid LAD. As she is now chest pain-free with resolution of her ST elevations, I did not feel like emergent attempted PCI on the RCA was a prudent choice of action. The tortuosity of the vessel and the eccentric nature of the lesions themselves make it a somewhat unfavorable vessel for PCI albeit possible if necessary.  The combination of the RCA in the extensive disease in the LAD begins a question of whether she would potentially benefit from bypass surgery. I have contacted Dr. Roxan Hockey from [place] surgery and asked him to see the patient. I was informed that since the patient was not having active chest pain he can the patient will be seen tomorrow. There would be no OR date for tomorrow, so therefore can consider "cooling down over the weekend "with plan for next week.  With elevated LVEDP, concern for acute diastolic heart failure, I decided against balloon pump for afterload reduction in light of her likely not going for urgent surgery anytime soon.    Diagnostic Dominance: Right         Echocardiogram 11/28/2016 Study Conclusions   - Left ventricle: The cavity size was normal. Systolic function was  normal. The estimated ejection fraction was in the range of 60%  to 65%. Wall motion was normal; there were no regional wall  motion abnormalities. Doppler parameters are consistent with both  elevated ventricular end-diastolic filling pressure and elevated  left atrial filling pressure.  - Aortic valve: There was mild regurgitation.  - Mitral valve: There was mild regurgitation.  - Right ventricle: The cavity size was mildly dilated. Wall  thickness was normal.  - Right atrium: The atrium was mildly dilated.  - Atrial septum: No defect or  patent foramen ovale was identified.   Carotid duplex 11/30/2016 Summary:  Bilateral: mild mixed plaque origin ICA. Tortuous vessels. 1-39%  ICA plaquing. Vertebral artery flow is antegrade.      Assessment and Plan:  1. CAD in native artery   2. Hyperlipidemia LDL goal <70   3. Essential hypertension   4. DOE (dyspnea on exertion)   5. SOB (shortness of breath)  6. Fatigue, unspecified type    1. CAD in native artery CABG with LIMA-LAD, SVG-diagonal, SVG-OM 2, SVG-PDA 2018.  Recent ED visit to [facility] on 06/13/2020 for chest pain.  She was ruled out for ACS with initial troponin of 17 and delta troponin of 0.  CBC unremarkable, BUN 27, creatinine 1.47, BNP of 66.  She was discharged home.  No current anginal symptoms.  Continue aspirin 81 mg daily.  2. Hyperlipidemia LDL goal <70 Continue atorvastatin 80 mg daily.  3. Essential hypertension Blood pressure is soft today at 98/62.  Decrease lisinopril to 5 mg daily.  Decrease metoprolol to 25 mg p.o. twice daily.  Continue chlorthalidone 25 mg daily.  4.  DOE/SOB/fatigue Please get a follow-up echocardiogram for increased DOE/SOB/fatigue.  Medication Adjustments/Labs and Tests Ordered: Current medicines are reviewed at length with the patient today.  Concerns regarding medicines are outlined above.   Disposition: Follow-up with Dr. Harl Bowie or APP 6 to 8 weeks  Signed, Levell July, NP 07/19/2020 3:16 PM    [place] [facility] [place], [place], [place] [address] Phone: [phone]; Fax: [phone]

## 2020-07-19 ENCOUNTER — Ambulatory Visit: Payer: Medicare Other | Admitting: Family Medicine

## 2020-07-19 ENCOUNTER — Encounter: Payer: Self-pay | Admitting: Family Medicine

## 2020-07-19 VITALS — BP 98/62 | HR 89 | Ht 67.0 in | Wt 205.6 lb

## 2020-07-19 DIAGNOSIS — I10 Essential (primary) hypertension: Secondary | ICD-10-CM | POA: Diagnosis not present

## 2020-07-19 DIAGNOSIS — R0602 Shortness of breath: Secondary | ICD-10-CM | POA: Diagnosis not present

## 2020-07-19 DIAGNOSIS — E785 Hyperlipidemia, unspecified: Secondary | ICD-10-CM | POA: Diagnosis not present

## 2020-07-19 DIAGNOSIS — R0600 Dyspnea, unspecified: Secondary | ICD-10-CM

## 2020-07-19 DIAGNOSIS — R5383 Other fatigue: Secondary | ICD-10-CM | POA: Diagnosis not present

## 2020-07-19 DIAGNOSIS — R0609 Other forms of dyspnea: Secondary | ICD-10-CM

## 2020-07-19 DIAGNOSIS — I2510 Atherosclerotic heart disease of native coronary artery without angina pectoris: Secondary | ICD-10-CM | POA: Diagnosis not present

## 2020-07-19 MED ORDER — LISINOPRIL 5 MG PO TABS: 5.0000 mg | ORAL_TABLET | Freq: Every day | ORAL | 6 refills | Status: DC

## 2020-07-19 MED ORDER — LISINOPRIL 5 MG PO TABS: 5.0000 mg | ORAL_TABLET | Freq: Every day | ORAL | 3 refills | Status: DC

## 2020-07-19 MED ORDER — METOPROLOL TARTRATE 25 MG PO TABS: 25.0000 mg | ORAL_TABLET | Freq: Two times a day (BID) | ORAL | 3 refills | Status: DC

## 2020-07-19 NOTE — Patient Instructions (Addendum)
Medication Instructions:   Decrease Lisinopril to 5mg  daily.   Decrease the Lopressor (Metoprolol tart) to 25mg  twice a day.  Continue all other medications.    Labwork: none  Testing/Procedures:  Your physician has requested that you have an echocardiogram. Echocardiography is a painless test that uses sound waves to create images of your heart. It provides your doctor with information about the size and shape of your heart and how well your heart's chambers and valves are working. This procedure takes approximately one hour. There are no restrictions for this procedure.  Office will contact with results via phone or letter.    Follow-Up: 6-8 weeks   Any Other Special Instructions Will Be Listed Below (If Applicable).  If you need a refill on your cardiac medications before your next appointment, please call your pharmacy.

## 2020-08-22 ENCOUNTER — Other Ambulatory Visit: Payer: Medicare Other

## 2020-09-05 ENCOUNTER — Ambulatory Visit (INDEPENDENT_AMBULATORY_CARE_PROVIDER_SITE_OTHER): Payer: Medicare Other

## 2020-09-05 DIAGNOSIS — R0600 Dyspnea, unspecified: Secondary | ICD-10-CM

## 2020-09-05 DIAGNOSIS — R0602 Shortness of breath: Secondary | ICD-10-CM | POA: Diagnosis not present

## 2020-09-05 DIAGNOSIS — R5383 Other fatigue: Secondary | ICD-10-CM

## 2020-09-05 DIAGNOSIS — R0609 Other forms of dyspnea: Secondary | ICD-10-CM

## 2020-09-05 LAB — ECHOCARDIOGRAM COMPLETE
AR max vel: 2.6 cm2
AV Area VTI: 3.08 cm2
AV Area mean vel: 2.35 cm2
AV Mean grad: 5.1 mmHg
AV Peak grad: 10.9 mmHg
AV Vena cont: 0.22 cm
Ao pk vel: 1.65 m/s
Area-P 1/2: 4.29 cm2
Calc EF: 61.3 %
P 1/2 time: 496 msec
S' Lateral: 1.96 cm
Single Plane A2C EF: 58.6 %
Single Plane A4C EF: 62.6 %

## 2020-09-05 NOTE — Progress Notes (Signed)
Cardiology Office Note  Date: 09/07/2020   ID: [name], DOB 1950/06/28, MRN [id number]  PCP:  Neale Burly, MD  Cardiologist:  None Electrophysiologist:  None   Chief Complaint: DOE follow up / Echocardiogram  History of Present Illness: [name] is a 70 y.o. female with a history of CAD, CABG x 4, STEMI, Chronic diastolic HF, HTN, HLD, long time heavy smoker.CKD. DM2  History of inferior STEMI September 2018 with multivessel CAD with subsequent four-vessel CABG with LIMA-LAD, SVG-diagonal, SVG-OM 2, SVG-PDA.  Echocardiogram with normal left ventricular systolic function.  EF of 60 to 65%.  Elevated filling pressures, mild aortic and mitral regurgitation.  Mild plaque in internal carotid arteries.  Last seen by Dr. Bronson Ing 07/07/2019.  She denied symptoms of chest pain, palpitations, shortness of breath, lightheadedness, dizziness, edema, orthopnea, PND, syncope.  She was symptomatically stable.  She was continuing aspirin, atorvastatin, metoprolol.  Her amlodipine was stopped due to low blood pressures.  On 06/13/2020 she presented to [facility] ER with precordial chest pain and left sided neck discomfort. Also had lightheadedness, nausea, and diaphoresis. She was ruled out for ACS and discharged   At last visit she denied any anginal or exertional symptoms.  She had decreased her dosage metoprolol down to 50 mg once a day due to low blood pressures.  She was continuing her lisinopril 10 mg daily and chlorthalidone 25 mg daily.  She had also cut down on her atorvastatin due to a family member who suggested it could affect her kidney function.  Was low during that visit at 98/62. She was not taking her metformin as directed either.  She denied any chest pain since her presentation to [facility] on 06/13/2020.  He had complain of some DOE/dyspnea and having to stop more often due to fatigue when at work.  An echocardiogram was ordered.  She is here for follow-up for recent  echocardiogram ordered due to complaining of dyspnea at last visit with increased fatigue. Echocardiogram on 09/05/2020 demonstrated EF of 70 to 75%.  With hyperdynamic function.  LV was no WMA's.  RV function normal with normal size.  No MR or mitral stenosis.  Mild aortic regurgitation.  She has a history of diabetes but not compliant with checking her blood blood sugars.  She states she previously had a blood sugar monitor but stopped checking her sugars on a regular basis.  States she has not checked her blood sugars in quite some time.  Currently on metformin 500 mg p.o. twice daily.  She also complains of back pain and right leg pain.  She also complains of finding it hard to go from sitting to standing position and reports a sensation of weakness in her hips and right lower extremity. We spoke about following with PCP to possibly have hip and back xrays to examine source of pain. She works at [employer] approximately 4-1/2 hours/day a couple days per week.  She states after standing during these hours when she returns home she is significantly tired.  She reports no known history of sleep apnea.  She does state she has a hard time staying asleep and wakes up frequently. She has a 40+ pack year history of smoking.      Past Medical History:  Diagnosis Date   CAD, multiple vessel 11/27/2016   pRCA 60% &55%, dRCA 95% & 90% (very tortuous); ost LAD 65%, mLAD 90%, Ost D1 50% &60%, OM2 60%.  EF 55-65%, High LVEDP  32-35%, Mod (3+) MR   Chronic diastolic heart failure ([place]) 11/27/2016   Colon polyps    Essential hypertension 11/27/2016   Hx of CABG 12/01/2016   LIMA-LAD, SVG-D1, SVG-OM2, SVG-rPDA   Hyperlipidemia with target low density lipoprotein (LDL) cholesterol less than 70 mg/dL 11/27/2016   Long-time heavy smoker    1 PPD x >40 yr   ST elevation myocardial infarction (STEMI) of inferior wall ([place]) 11/27/2016   - Aborted STEMI - EKG STE resolved after ASA & Heparin; MV CAD    Past Surgical  History:  Procedure Laterality Date   ABDOMINAL HYSTERECTOMY     CORONARY ARTERY BYPASS GRAFT N/A 12/01/2016   Procedure: CORONARY ARTERY BYPASS GRAFTING (CABG) x four , using left internal mammary artery and right leg greater saphenous vein harvested endoscopically - LIMA to LAD, -SVG to DIAG1, SVG to OM2, SVG to PDA ;  Surgeon: Melrose Nakayama, MD;  Location: [place];  Service: Open Heart Surgery;  Laterality: N/A;   INTRAOPERATIVE TRANSESOPHAGEAL ECHOCARDIOGRAM N/A 12/01/2016   Procedure: INTRAOPERATIVE TRANSESOPHAGEAL ECHOCARDIOGRAM;  Surgeon: Melrose Nakayama, MD;  Location: [facility] OR;  Service: Open Heart Surgery: EF 55-65%. No RWMA. MIld AI-- (post CABG, EF ~50%, Mod AI. MIld MR   LEFT HEART CATH AND CORONARY ANGIOGRAPHY N/A 11/27/2016   Procedure: LEFT HEART CATH AND CORONARY ANGIOGRAPHY;  Surgeon: Leonie Man, MD;  Location: [facility] INVASIVE CV LAB: pRCA 60% &55%, dRCA 95% & 90% (very tortuous); ost LAD 65%, mLAD 90%, Ost D1 50% &60%, OM2 60%.  EF 55-65%, High LVEDP 32-35%, Mod (3+) MR   TRANSTHORACIC ECHOCARDIOGRAM  11/27/2016   EF 60-65% but no RWMA. Diastolic dysfunction noted but not calculated    Current Outpatient Medications  Medication Sig Dispense Refill   Ascorbic Acid (VITAMIN C) 1000 MG tablet Take 1,000 mg by mouth daily.     aspirin EC 81 MG tablet Take 1 tablet (81 mg total) by mouth daily.     atorvastatin (LIPITOR) 80 MG tablet TAKE 1 TABLET (80 MG TOTAL) BY MOUTH DAILY AT 6 PM. (Patient taking differently: Take 40 mg by mouth daily at 6 PM.) 90 tablet 0   chlorthalidone (HYGROTON) 25 MG tablet Take 25 mg by mouth daily.     Cholecalciferol (VITAMIN D3 PO) Take 1 tablet by mouth daily.     Cyanocobalamin (VITAMIN B 12 PO) Take 1 tablet by mouth daily.     lisinopril (ZESTRIL) 5 MG tablet Take 1 tablet (5 mg total) by mouth daily. 90 tablet 3   metFORMIN (GLUCOPHAGE) 500 MG tablet Take 500 mg by mouth daily.     metoprolol tartrate (LOPRESSOR) 25 MG tablet Take 1  tablet (25 mg total) by mouth 2 (two) times daily. 180 tablet 3   Omega-3 1000 MG CAPS Take 1 capsule by mouth 2 (two) times daily.     potassium chloride SA (KLOR-CON) 20 MEQ tablet Take 10 mEq by mouth daily.     tiZANidine (ZANAFLEX) 2 MG tablet Take 2 mg by mouth at bedtime as needed.     Zinc Oxide-Vitamin C (ZINC PLUS VITAMIN C PO) Take 1 tablet by mouth. occasionally     No current facility-administered medications for this visit.   Allergies:  Patient has no known allergies.   Social History: The patient  reports that she quit smoking about 3 years ago. Her smoking use included cigarettes. She has a 42.00 pack-year smoking history. She has never used smokeless tobacco. She reports that she does  not drink alcohol and does not use drugs.   Family History: The patient's family history includes Heart disease in her brother.   ROS:  Please see the history of present illness. Otherwise, complete review of systems is positive for none.  All other systems are reviewed and negative.   Physical Exam: VS:  BP 104/70   Pulse 96   Ht 5\' 7"  (1.702 m)   Wt 208 lb (94.3 kg)   SpO2 98%   BMI 32.58 kg/m , BMI Body mass index is 32.58 kg/m.  Wt Readings from Last 3 Encounters:  09/06/20 208 lb (94.3 kg)  07/19/20 205 lb 9.6 oz (93.3 kg)  07/07/19 204 lb 9.6 oz (92.8 kg)    General: Patient appears comfortable at rest. Neck: Supple, no elevated JVP or carotid bruits, no thyromegaly. Lungs: Clear to auscultation, nonlabored breathing at rest. Cardiac: Regular rate and rhythm, no S3 or significant systolic murmur, no pericardial rub. Extremities: No pitting edema, distal pulses 2+. Skin: Warm and dry. Musculoskeletal: No kyphosis. Neuropsychiatric: Alert and oriented x3, affect grossly appropriate.  ECG:  EKG at recent visit to [facility] on 06/13/2020 demonstrated normal sinus rhythm rate of 79.  RBBB, inferior infarct, age undetermined.  Recent Labwork: No results found for requested  labs within last 8760 hours.     Component Value Date/Time   CHOL 117 12/29/2016 1140   TRIG 108 12/29/2016 1140   HDL 41 12/29/2016 1140   CHOLHDL 2.9 12/29/2016 1140   CHOLHDL 3.8 11/27/2016 2115   VLDL 20 11/27/2016 2115   LDLCALC 54 12/29/2016 1140    Other Studies Reviewed Today:  Echocardiogram 09/05/2020  1. Left ventricular ejection fraction, by estimation, is 70 to 75%. The left ventricle has hyperdynamic function. The left ventricle has no regional wall motion abnormalities. Left ventricular diastolic parameters are indeterminate. 2. Right ventricular systolic function is normal. The right ventricular size is normal. 3. The mitral valve is normal in structure. No evidence of mitral valve regurgitation. No evidence of mitral stenosis. 4. The aortic valve has an indeterminant number of cusps. There is mild calcification of the aortic valve. There is mild thickening of the aortic valve. Aortic valve regurgitation is mild. Comparison(s): TEE done 12/01/16 showed an EF of 55-65%.    Cardiac Catheterization: 11/2016 Prox RCA-2 lesion, 60 %stenosed. Prox-mid RCA-1 lesion, 55 %stenosed. Dist RCA-2 lesion, 95 %stenosed. Dist RCA-1 lesion, 90 %stenosed. - This combination is likely culprit lesion, however with very tortuous RCA in a stable patient did not feel it was a good idea to attempt PTCA at this time. Ost LAD lesion, 65 %stenosed. Mid LAD lesion, 90 %stenosed. Ost 1st Diag to 1st Diag lesion, 50 %stenosed. 1st Diag lesion, 60 %stenosed. Dist LAD lesion, 50 %stenosed. - This is beyond the 2nd Diag branch. 2nd Mrg lesion, 60 %stenosed. The left ventricular systolic function is normal. The left ventricular ejection fraction is 55-65% by visual estimate. LV end diastolic pressure is severely elevated. 32-35 mmhg There is moderate (3+) mitral regurgitation.   Very difficult situation with the patient has severe 2-3 vessel disease involving extensive disease in the distal RCA as  well as ostial proximal and mid LAD. As she is now chest pain-free with resolution of her ST elevations, I did not feel like emergent attempted PCI on the RCA was a prudent choice of action. The tortuosity of the vessel and the eccentric nature of the lesions themselves make it a somewhat unfavorable vessel for PCI albeit possible if  necessary.   The combination of the RCA in the extensive disease in the LAD begins a question of whether she would potentially benefit from bypass surgery. I have contacted Dr. Roxan Hockey from [facility] and asked him to see the patient. I was informed that since the patient was not having active chest pain he can the patient will be seen tomorrow. There would be no OR date for tomorrow, so therefore can consider "cooling down over the weekend "with plan for next week.   With elevated LVEDP, concern for acute diastolic heart failure, I decided against balloon pump for afterload reduction in light of her likely not going for urgent surgery anytime soon.    Diagnostic Dominance: Right         Echocardiogram 11/28/2016  Study Conclusions   - Left ventricle: The cavity size was normal. Systolic function was    normal. The estimated ejection fraction was in the range of 60%    to 65%. Wall motion was normal; there were no regional wall    motion abnormalities. Doppler parameters are consistent with both    elevated ventricular end-diastolic filling pressure and elevated    left atrial filling pressure.  - Aortic valve: There was mild regurgitation.  - Mitral valve: There was mild regurgitation.  - Right ventricle: The cavity size was mildly dilated. Wall    thickness was normal.  - Right atrium: The atrium was mildly dilated.  - Atrial septum: No defect or patent foramen ovale was identified.    Carotid duplex 11/30/2016 Summary:  Bilateral: mild mixed plaque origin ICA. Tortuous vessels. 1-39%  ICA plaquing. Vertebral artery flow is antegrade.           Assessment and Plan:  1. CAD in native artery   2. Hyperlipidemia LDL goal <70   3. Essential hypertension   4. DOE (dyspnea on exertion)   5. Type 2 diabetes mellitus without complication, without long-term current use of insulin ([place])     1. CAD in native artery / S/P CAB x 4 CABG with LIMA-LAD, SVG-diagonal, SVG-OM 2, SVG-PDA 2018.  Recent ED visit to [facility] on 06/13/2020 for chest pain.  She was ruled out for ACS with initial troponin of 17 and delta troponin of 0.  CBC unremarkable, BUN 27, creatinine 1.47, GFR 42, BNP of 66.  She was discharged home.  No current anginal symptoms.  Continue aspirin 81 mg daily. Continue metoprolol 25 mg po bid.  2. Hyperlipidemia LDL goal <70 Continue atorvastatin 80 mg daily.  3. Essential hypertension Blood pressure is soft today at 104/70.  At last visit we decreased lisinopril to 5 mg daily and metoprolol to 25 mg p.o. twice daily. BP improved some. Continue chlorthalidone 25 mg daily. She still complains of fatigue. May need to titrate BB down in the future if she continues with fatigue.  4.  DOE/SOB/fatigue Echocardiogram on 09/05/2020 demonstrated EF of 70 to 75%.  With hyperdynamic function.  LV was no WMA's.  RV function normal with normal size.  No MR or mitral stenosis.  Mild aortic regurgitation. She continues with SOB and fatigue / activity intolerance. She has a significant history of smoking 40 + years. May need to refer to pulmonary if persists.   5. Type 2 diabetes.  She is on metformin only and has not checked her blood sugars in quite some time. Question compliance with therapy. She is complaining of pain, weakness in pelvic girdle area and lower limbs  mostly on the  right side possibly neuropathic in nature. Does not describe as claudication like pain. She has some limited ROM in her right hip per her statement. Question diabetic neuropathy. Will refer to endocrinology Dr Dorris Fetch for better management of diabetes.   Medication  Adjustments/Labs and Tests Ordered: Current medicines are reviewed at length with the patient today.  Concerns regarding medicines are outlined above.   Disposition: Follow-up with Dr. Harl Bowie or APP 6 months  Signed, Levell July, NP 09/07/2020 6:04 AM    [facility] at [place], [place], [place] [address] Phone: [phone]; Fax: [phone]

## 2020-09-06 ENCOUNTER — Encounter: Payer: Self-pay | Admitting: Family Medicine

## 2020-09-06 ENCOUNTER — Ambulatory Visit (INDEPENDENT_AMBULATORY_CARE_PROVIDER_SITE_OTHER): Payer: Medicare Other | Admitting: Family Medicine

## 2020-09-06 VITALS — BP 104/70 | HR 96 | Ht 67.0 in | Wt 208.0 lb

## 2020-09-06 DIAGNOSIS — I10 Essential (primary) hypertension: Secondary | ICD-10-CM | POA: Diagnosis not present

## 2020-09-06 DIAGNOSIS — E119 Type 2 diabetes mellitus without complications: Secondary | ICD-10-CM

## 2020-09-06 DIAGNOSIS — I2510 Atherosclerotic heart disease of native coronary artery without angina pectoris: Secondary | ICD-10-CM

## 2020-09-06 DIAGNOSIS — R0600 Dyspnea, unspecified: Secondary | ICD-10-CM | POA: Diagnosis not present

## 2020-09-06 DIAGNOSIS — E785 Hyperlipidemia, unspecified: Secondary | ICD-10-CM | POA: Diagnosis not present

## 2020-09-06 DIAGNOSIS — R0609 Other forms of dyspnea: Secondary | ICD-10-CM

## 2020-09-06 NOTE — Patient Instructions (Addendum)
Medication Instructions:  Your physician recommends that you continue on your current medications as directed. Please refer to the Current Medication list given to you today.  Labwork: none  Testing/Procedures: none  Follow-Up: Your physician recommends that you schedule a follow-up appointment in: 6 months You have been referred to Dr. Dorris Fetch  Any Other Special Instructions Will Be Listed Below (If Applicable).  If you need a refill on your cardiac medications before your next appointment, please call your pharmacy.

## 2020-09-07 ENCOUNTER — Telehealth: Payer: Self-pay | Admitting: Family Medicine

## 2020-09-07 ENCOUNTER — Encounter: Payer: Self-pay | Admitting: Family Medicine

## 2020-09-07 NOTE — Telephone Encounter (Signed)
New message    Patient returning call about her echo

## 2020-09-11 NOTE — Telephone Encounter (Signed)
Results of echo given to patient during 09/06/20 office visit. I asked patient to return call to see if there was any other questions.

## 2020-10-13 ENCOUNTER — Encounter: Payer: Self-pay | Admitting: Cardiology

## 2020-10-15 DIAGNOSIS — M179 Osteoarthritis of knee, unspecified: Secondary | ICD-10-CM | POA: Diagnosis not present

## 2020-10-15 DIAGNOSIS — I10 Essential (primary) hypertension: Secondary | ICD-10-CM | POA: Diagnosis not present

## 2020-10-15 DIAGNOSIS — E782 Mixed hyperlipidemia: Secondary | ICD-10-CM | POA: Diagnosis not present

## 2020-10-15 DIAGNOSIS — Z0000 Encounter for general adult medical examination without abnormal findings: Secondary | ICD-10-CM | POA: Diagnosis not present

## 2020-10-15 DIAGNOSIS — N189 Chronic kidney disease, unspecified: Secondary | ICD-10-CM | POA: Diagnosis not present

## 2020-10-15 DIAGNOSIS — E1143 Type 2 diabetes mellitus with diabetic autonomic (poly)neuropathy: Secondary | ICD-10-CM | POA: Diagnosis not present

## 2020-10-22 ENCOUNTER — Encounter (INDEPENDENT_AMBULATORY_CARE_PROVIDER_SITE_OTHER): Payer: Self-pay | Admitting: *Deleted

## 2020-11-07 DIAGNOSIS — E1143 Type 2 diabetes mellitus with diabetic autonomic (poly)neuropathy: Secondary | ICD-10-CM | POA: Diagnosis not present

## 2020-11-07 DIAGNOSIS — N189 Chronic kidney disease, unspecified: Secondary | ICD-10-CM | POA: Diagnosis not present

## 2020-11-07 DIAGNOSIS — I10 Essential (primary) hypertension: Secondary | ICD-10-CM | POA: Diagnosis not present

## 2020-11-07 DIAGNOSIS — E782 Mixed hyperlipidemia: Secondary | ICD-10-CM | POA: Diagnosis not present

## 2020-11-15 DIAGNOSIS — Z13820 Encounter for screening for osteoporosis: Secondary | ICD-10-CM | POA: Diagnosis not present

## 2020-11-15 DIAGNOSIS — M810 Age-related osteoporosis without current pathological fracture: Secondary | ICD-10-CM | POA: Diagnosis not present

## 2020-12-19 ENCOUNTER — Other Ambulatory Visit (INDEPENDENT_AMBULATORY_CARE_PROVIDER_SITE_OTHER): Payer: Self-pay

## 2020-12-19 ENCOUNTER — Telehealth (INDEPENDENT_AMBULATORY_CARE_PROVIDER_SITE_OTHER): Payer: Self-pay

## 2020-12-19 DIAGNOSIS — Z1211 Encounter for screening for malignant neoplasm of colon: Secondary | ICD-10-CM

## 2020-12-19 NOTE — Telephone Encounter (Signed)
Referring MD/PCP: Hasanaj  Procedure: Tcs  Reason/Indication:  Screening  Has patient had this procedure before?  Yes   If so, when, by whom and where?  [facility]  Is there a family history of colon cancer?  no  Who?  What age when diagnosed?    Is patient diabetic? If yes, Type 1 or Type 2   yes , Type 2      Does patient have prosthetic heart valve or mechanical valve?  no  Do you have a pacemaker/defibrillator?  no  Has patient ever had endocarditis/atrial fibrillation? no  Does patient use oxygen? no  Has patient had joint replacement within last 12 months?  no  Is patient constipated or do they take laxatives? no  Does patient have a history of alcohol/drug use?  no  Have you had a stroke/heart attack last 6 mths? no  Do you take medicine for weight loss?  no  For female patients,: do you still have your menstrual cycle? no  Is patient on blood thinner such as Coumadin, Plavix and/or Aspirin? no  Medications: Metformin 500 mg 2 tab bid, lisinopril 10 mg daily, chlorthalidon3 25 mg daily, potassium 20 meq daily, atorvastatin 80 mg daily, metoprolol 50 mg 1 1/2 tab bid  Allergies: nkda  Medication Adjustment per Dr Laural Golden Hold Metformin the evening prior an the morning of procedure  Procedure date & time: 01/02/21 at 12:15

## 2020-12-20 ENCOUNTER — Encounter (INDEPENDENT_AMBULATORY_CARE_PROVIDER_SITE_OTHER): Payer: Self-pay

## 2020-12-28 NOTE — Patient Instructions (Signed)
   Your procedure is scheduled on: 01/02/2021  Report to [facility] at   10:30  AM.  Call this number if you have problems the morning of surgery: [phone]   Remember:              Follow Directions on the letter you received from Your Physician's office regarding the Bowel Prep              No Smoking the day of Procedure :   Take these medicines the morning of surgery with A SIP OF WATER: Metoprolol   Do not wear jewelry, make-up or nail polish.    Do not bring valuables to the hospital.  Contacts, dentures or bridgework may not be worn into surgery.  .   Patients discharged the day of surgery will not be allowed to drive home.     Colonoscopy, Adult, Care After This sheet gives you information about how to care for yourself after your procedure. Your health care provider may also give you more specific instructions. If you have problems or questions, contact your health care provider. What can I expect after the procedure? After the procedure, it is common to have: A small amount of blood in your stool for 24 hours after the procedure. Some gas. Mild abdominal cramping or bloating.  Follow these instructions at home: General instructions  For the first 24 hours after the procedure: Do not drive or use machinery. Do not sign important documents. Do not drink alcohol. Do your regular daily activities at a slower pace than normal. Eat soft, easy-to-digest foods. Rest often. Take over-the-counter or prescription medicines only as told by your health care provider. It is up to you to get the results of your procedure. Ask your health care provider, or the department performing the procedure, when your results will be ready. Relieving cramping and bloating Try walking around when you have cramps or feel bloated. Apply heat to your abdomen as told by your health care provider. Use a heat source that your health care provider recommends, such as a moist heat pack or a  heating pad. Place a towel between your skin and the heat source. Leave the heat on for 20-30 minutes. Remove the heat if your skin turns bright red. This is especially important if you are unable to feel pain, heat, or cold. You may have a greater risk of getting burned. Eating and drinking Drink enough fluid to keep your urine clear or pale yellow. Resume your normal diet as instructed by your health care provider. Avoid heavy or fried foods that are hard to digest. Avoid drinking alcohol for as long as instructed by your health care provider. Contact a health care provider if: You have blood in your stool 2-3 days after the procedure. Get help right away if: You have more than a small spotting of blood in your stool. You pass large blood clots in your stool. Your abdomen is swollen. You have nausea or vomiting. You have a fever. You have increasing abdominal pain that is not relieved with medicine. This information is not intended to replace advice given to you by your health care provider. Make sure you discuss any questions you have with your health care provider. Document Released: 10/09/2003 Document Revised: 11/19/2015 Document Reviewed: 05/08/2015 Elsevier Interactive Patient Education  [employer].

## 2020-12-31 ENCOUNTER — Other Ambulatory Visit: Payer: Self-pay

## 2020-12-31 ENCOUNTER — Encounter (HOSPITAL_COMMUNITY): Payer: Self-pay

## 2020-12-31 ENCOUNTER — Encounter (HOSPITAL_COMMUNITY): Admission: RE | Admit: 2020-12-31 | Payer: Medicare Other | Source: Ambulatory Visit

## 2020-12-31 ENCOUNTER — Other Ambulatory Visit (INDEPENDENT_AMBULATORY_CARE_PROVIDER_SITE_OTHER): Payer: Self-pay

## 2020-12-31 ENCOUNTER — Encounter (HOSPITAL_COMMUNITY)
Admission: RE | Admit: 2020-12-31 | Discharge: 2020-12-31 | Disposition: A | Discharge status: Home / Self Care | Payer: Medicare Other | Source: Ambulatory Visit | Attending: Internal Medicine | Admitting: Internal Medicine

## 2020-12-31 DIAGNOSIS — Z01812 Encounter for preprocedural laboratory examination: Secondary | ICD-10-CM | POA: Insufficient documentation

## 2020-12-31 DIAGNOSIS — Z1211 Encounter for screening for malignant neoplasm of colon: Secondary | ICD-10-CM | POA: Insufficient documentation

## 2020-12-31 HISTORY — DX: Type 2 diabetes mellitus without complications: E11.9

## 2020-12-31 LAB — BASIC METABOLIC PANEL
Anion gap: 6 (ref 5–15)
BUN: 22 mg/dL (ref 8–23)
CO2: 30 mmol/L (ref 22–32)
Calcium: 10 mg/dL (ref 8.9–10.3)
Chloride: 101 mmol/L (ref 98–111)
Creatinine, Ser: 1.1 mg/dL — ABNORMAL HIGH (ref 0.44–1.00)
GFR, Estimated: 55 mL/min — ABNORMAL LOW (ref >60)
Glucose, Bld: 122 mg/dL — ABNORMAL HIGH (ref 70–99)
Potassium: 3.7 mmol/L (ref 3.5–5.1)
Sodium: 137 mmol/L (ref 135–145)

## 2021-01-02 ENCOUNTER — Encounter (HOSPITAL_COMMUNITY): Payer: Self-pay | Admitting: Internal Medicine

## 2021-01-02 ENCOUNTER — Ambulatory Visit (HOSPITAL_COMMUNITY): Payer: Medicare Other | Admitting: Anesthesiology

## 2021-01-02 ENCOUNTER — Ambulatory Visit (HOSPITAL_COMMUNITY)
Admission: RE | Admit: 2021-01-02 | Discharge: 2021-01-02 | Disposition: A | Discharge status: Home / Self Care | Payer: Medicare Other | Attending: Internal Medicine | Admitting: Internal Medicine

## 2021-01-02 ENCOUNTER — Encounter (HOSPITAL_COMMUNITY)
Admission: RE | Disposition: A | Discharge status: Home / Self Care | Payer: Self-pay | Source: Home / Self Care | Attending: Internal Medicine

## 2021-01-02 DIAGNOSIS — Z7984 Long term (current) use of oral hypoglycemic drugs: Secondary | ICD-10-CM | POA: Insufficient documentation

## 2021-01-02 DIAGNOSIS — Z1211 Encounter for screening for malignant neoplasm of colon: Secondary | ICD-10-CM | POA: Diagnosis not present

## 2021-01-02 DIAGNOSIS — Z79899 Other long term (current) drug therapy: Secondary | ICD-10-CM | POA: Diagnosis not present

## 2021-01-02 DIAGNOSIS — Z7982 Long term (current) use of aspirin: Secondary | ICD-10-CM | POA: Insufficient documentation

## 2021-01-02 DIAGNOSIS — K644 Residual hemorrhoidal skin tags: Secondary | ICD-10-CM | POA: Diagnosis not present

## 2021-01-02 DIAGNOSIS — D122 Benign neoplasm of ascending colon: Secondary | ICD-10-CM | POA: Insufficient documentation

## 2021-01-02 DIAGNOSIS — Z951 Presence of aortocoronary bypass graft: Secondary | ICD-10-CM | POA: Diagnosis not present

## 2021-01-02 DIAGNOSIS — Z87891 Personal history of nicotine dependence: Secondary | ICD-10-CM | POA: Diagnosis not present

## 2021-01-02 DIAGNOSIS — K5730 Diverticulosis of large intestine without perforation or abscess without bleeding: Secondary | ICD-10-CM | POA: Diagnosis not present

## 2021-01-02 DIAGNOSIS — K635 Polyp of colon: Secondary | ICD-10-CM | POA: Diagnosis not present

## 2021-01-02 DIAGNOSIS — I25110 Atherosclerotic heart disease of native coronary artery with unstable angina pectoris: Secondary | ICD-10-CM | POA: Diagnosis not present

## 2021-01-02 DIAGNOSIS — Z139 Encounter for screening, unspecified: Secondary | ICD-10-CM | POA: Diagnosis not present

## 2021-01-02 HISTORY — PX: POLYPECTOMY: SHX5525

## 2021-01-02 HISTORY — PX: COLONOSCOPY WITH PROPOFOL: SHX5780

## 2021-01-02 LAB — GLUCOSE, CAPILLARY: Glucose-Capillary: 141 mg/dL — ABNORMAL HIGH (ref 70–99)

## 2021-01-02 LAB — HM COLONOSCOPY

## 2021-01-02 SURGERY — COLONOSCOPY WITH PROPOFOL
Anesthesia: General

## 2021-01-02 MED ORDER — PROPOFOL 10 MG/ML IV BOLUS
INTRAVENOUS | Status: DC | PRN
  Administered 2021-01-02: 50 mg via INTRAVENOUS

## 2021-01-02 MED ORDER — PROPOFOL 500 MG/50ML IV EMUL
INTRAVENOUS | Status: DC | PRN
  Administered 2021-01-02: 150 ug/kg/min via INTRAVENOUS

## 2021-01-02 MED ORDER — PHENYLEPHRINE 40 MCG/ML (10ML) SYRINGE FOR IV PUSH (FOR BLOOD PRESSURE SUPPORT)
PREFILLED_SYRINGE | INTRAVENOUS | Status: AC
  Filled 2021-01-02: qty 10

## 2021-01-02 MED ORDER — LACTATED RINGERS IV SOLN: INTRAVENOUS | Status: DC

## 2021-01-02 MED ORDER — PHENYLEPHRINE HCL (PRESSORS) 10 MG/ML IV SOLN
INTRAVENOUS | Status: DC | PRN
  Administered 2021-01-02 (×3): 120 ug via INTRAVENOUS

## 2021-01-02 NOTE — Transfer of Care (Signed)
Immediate Anesthesia Transfer of Care Note  Patient: [name]  Procedure(s) Performed: COLONOSCOPY WITH PROPOFOL POLYPECTOMY  Patient Location: PACU  Anesthesia Type:General  Level of Consciousness: awake, alert , oriented and patient cooperative  Airway & Oxygen Therapy: Patient Spontanous Breathing  Post-op Assessment: Report given to RN, Post -op Vital signs reviewed and stable and Patient moving all extremities X 4  Post vital signs: Reviewed and stable  Last Vitals:  Vitals Value Taken Time  BP 95/70 01/02/21 1232  Temp 36.1 C 01/02/21 1232  Pulse 73 01/02/21 1232  Resp 18 01/02/21 1232  SpO2 98 % 01/02/21 1232    Last Pain:  Vitals:   01/02/21 1232  TempSrc: Axillary  PainSc: 0-No pain      Patients Stated Pain Goal: 7 (29/56/21 3086)  Complications: No notable events documented.

## 2021-01-02 NOTE — H&P (Signed)
[name] is an 70 y.o. female.   Chief Complaint: Patient is here for colonoscopy HPI: Patient is 70 year old African-American female who is here for screening colonoscopy.  Last exam was more than 10 years ago.  She denies abdominal pain change in bowel habits or rectal bleeding.  She is not having chest pain or shortness of breath.  She states she works 2 days a week 5 hours each time. Family history is negative for CRC. Last aspirin dose was yesterday.  Past Medical History:  Diagnosis Date   CAD, multiple vessel 11/27/2016   pRCA 60% &55%, dRCA 95% & 90% (very tortuous); ost LAD 65%, mLAD 90%, Ost D1 50% &60%, OM2 60%.  EF 55-65%, High LVEDP 32-35%, Mod (3+) MR   Chronic diastolic heart failure ([place]) 11/27/2016   Colon polyps    Diabetes mellitus without complication ([place])    Essential hypertension 11/27/2016   Hx of CABG 12/01/2016   LIMA-LAD, SVG-D1, SVG-OM2, SVG-rPDA   Hyperlipidemia with target low density lipoprotein (LDL) cholesterol less than 70 mg/dL 11/27/2016   Long-time heavy smoker    1 PPD x >40 yr   ST elevation myocardial infarction (STEMI) of inferior wall ([place]) 11/27/2016   - Aborted STEMI - EKG STE resolved after ASA & Heparin; MV CAD    Past Surgical History:  Procedure Laterality Date   ABDOMINAL HYSTERECTOMY     CORONARY ARTERY BYPASS GRAFT N/A 12/01/2016   Procedure: CORONARY ARTERY BYPASS GRAFTING (CABG) x four , using left internal mammary artery and right leg greater saphenous vein harvested endoscopically - LIMA to LAD, -SVG to DIAG1, SVG to OM2, SVG to PDA ;  Surgeon: Melrose Nakayama, MD;  Location: [facility];  Service: Open Heart Surgery;  Laterality: N/A;   INTRAOPERATIVE TRANSESOPHAGEAL ECHOCARDIOGRAM N/A 12/01/2016   Procedure: INTRAOPERATIVE TRANSESOPHAGEAL ECHOCARDIOGRAM;  Surgeon: Melrose Nakayama, MD;  Location: [facility] OR;  Service: Open Heart Surgery: EF 55-65%. No RWMA. MIld AI-- (post CABG, EF ~50%, Mod AI. MIld MR   LEFT HEART CATH  AND CORONARY ANGIOGRAPHY N/A 11/27/2016   Procedure: LEFT HEART CATH AND CORONARY ANGIOGRAPHY;  Surgeon: Leonie Man, MD;  Location: [facility] INVASIVE CV LAB: pRCA 60% &55%, dRCA 95% & 90% (very tortuous); ost LAD 65%, mLAD 90%, Ost D1 50% &60%, OM2 60%.  EF 55-65%, High LVEDP 32-35%, Mod (3+) MR   TRANSTHORACIC ECHOCARDIOGRAM  11/27/2016   EF 60-65% but no RWMA. Diastolic dysfunction noted but not calculated    Family History  Problem Relation Age of Onset   Heart disease Brother    Social History:  reports that she quit smoking about 3 years ago. Her smoking use included cigarettes. She has a 42.00 pack-year smoking history. She has never used smokeless tobacco. She reports that she does not drink alcohol and does not use drugs.  Allergies: No Known Allergies  Medications Prior to Admission  Medication Sig Dispense Refill   acetaminophen (TYLENOL) 325 MG tablet Take 650 mg by mouth every 6 (six) hours as needed for moderate pain.     aspirin EC 81 MG tablet Take 1 tablet (81 mg total) by mouth daily.     atorvastatin (LIPITOR) 80 MG tablet TAKE 1 TABLET (80 MG TOTAL) BY MOUTH DAILY AT 6 PM. 90 tablet 0   chlorthalidone (HYGROTON) 25 MG tablet Take 25 mg by mouth daily.     Cholecalciferol (VITAMIN D3 PO) Take 1 tablet by mouth daily.     Cyanocobalamin (VITAMIN B 12 PO)  Take 1 tablet by mouth daily.     Homeopathic Products [facility] RELIEF EX) Apply 1-2 sprays topically daily as needed (leg pain).     lisinopril (ZESTRIL) 10 MG tablet Take 10 mg by mouth daily.     MAGNESIUM PO Take 1 tablet by mouth daily.     metFORMIN (GLUCOPHAGE-XR) 500 MG 24 hr tablet Take 1,000 mg by mouth 2 (two) times daily.     metoprolol tartrate (LOPRESSOR) 25 MG tablet Take 1 tablet (25 mg total) by mouth 2 (two) times daily. (Patient taking differently: Take 25 mg by mouth daily.) 180 tablet 3   Omega-3 1000 MG CAPS Take 1,000 mg by mouth 2 (two) times daily.     potassium chloride SA (KLOR-CON) 20 MEQ  tablet Take 20 mEq by mouth daily.     tiZANidine (ZANAFLEX) 2 MG tablet Take 2 mg by mouth at bedtime as needed for muscle spasms.     lisinopril (ZESTRIL) 5 MG tablet Take 1 tablet (5 mg total) by mouth daily. (Patient not taking: No sig reported) 90 tablet 3    Results for orders placed or performed during the hospital encounter of 01/02/21 (from the past 48 hour(s))  Glucose, capillary     Status: Abnormal   Collection Time: 01/02/21 11:08 AM  Result Value Ref Range   Glucose-Capillary 141 (H) 70 - 99 mg/dL    Comment: Glucose reference range applies only to samples taken after fasting for at least 8 hours.   No results found.  Review of Systems  Blood pressure 103/81, pulse 81, temperature 98.1 F (36.7 C), temperature source Oral, resp. rate 19, SpO2 97 %. Physical Exam HENT:     Mouth/Throat:     Mouth: Mucous membranes are moist.     Pharynx: Oropharynx is clear.  Eyes:     General: No scleral icterus.    Conjunctiva/sclera: Conjunctivae normal.  Cardiovascular:     Rate and Rhythm: Normal rate and regular rhythm.     Pulses: Normal pulses.     Heart sounds: Normal heart sounds.     Comments: Midsternal scar Pulmonary:     Effort: Pulmonary effort is normal.     Breath sounds: Normal breath sounds.  Abdominal:     General: There is no distension.     Palpations: Abdomen is soft. There is no mass.     Tenderness: There is no abdominal tenderness.  Musculoskeletal:        General: No swelling.     Cervical back: Neck supple.  Lymphadenopathy:     Cervical: No cervical adenopathy.  Skin:    General: Skin is warm and dry.  Neurological:     Mental Status: She is alert.     Assessment/Plan  Average risk screening colonoscopy.  Hildred Laser, MD 01/02/2021, 12:01 PM

## 2021-01-02 NOTE — Anesthesia Preprocedure Evaluation (Signed)
Anesthesia Evaluation  Patient identified by MRN, date of birth, ID band Patient awake    Reviewed: Allergy & Precautions, NPO status , Patient's Chart, lab work & pertinent test results  Airway Mallampati: III  TM Distance: >3 FB Neck ROM: Full    Dental  (+) Dental Advisory Given, Loose, Edentulous Upper,  Lower front Very loose teeth x 4 :   Pulmonary former smoker,    Pulmonary exam normal breath sounds clear to auscultation       Cardiovascular Exercise Tolerance: Good hypertension, Pt. on medications + angina + CAD, + Past MI and + CABG  Normal cardiovascular exam Rhythm:Regular Rate:Normal     Neuro/Psych negative neurological ROS  negative psych ROS   GI/Hepatic negative GI ROS, Neg liver ROS,   Endo/Other  diabetes, Well Controlled, Type 2, Oral Hypoglycemic Agents  Renal/GU      Musculoskeletal negative musculoskeletal ROS (+)   Abdominal   Peds  Hematology negative hematology ROS (+)   Anesthesia Other Findings   Reproductive/Obstetrics negative OB ROS                             Anesthesia Physical Anesthesia Plan  ASA: 3  Anesthesia Plan: General   Post-op Pain Management:    Induction: Intravenous  PONV Risk Score and Plan: TIVA  Airway Management Planned: Nasal Cannula and Natural Airway  Additional Equipment:   Intra-op Plan:   Post-operative Plan:   Informed Consent: I have reviewed the patients History and Physical, chart, labs and discussed the procedure including the risks, benefits and alternatives for the proposed anesthesia with the patient or authorized representative who has indicated his/her understanding and acceptance.     Dental advisory given  Plan Discussed with: CRNA and Surgeon  Anesthesia Plan Comments:         Anesthesia Quick Evaluation

## 2021-01-02 NOTE — Op Note (Signed)
[facility] Patient Name: [name] Procedure Date: 01/02/2021 11:45 AM MRN: [id number] Date of Birth: Jul 25, 1950 Attending MD: Hildred Laser , MD CSN: [id number] Age: 70 Admit Type: Outpatient Procedure:                Colonoscopy Indications:              Screening for colorectal malignant neoplasm Providers:                Hildred Laser, MD, Lurline Del, RN, Casimer Bilis, Technician Referring MD:             Stoney Bang , MD Medicines:                Propofol per Anesthesia Complications:            No immediate complications. Estimated Blood Loss:     Estimated blood loss was minimal. Procedure:                Pre-Anesthesia Assessment:                           - Prior to the procedure, a History and Physical                            was performed, and patient medications and                            allergies were reviewed. The patient's tolerance of                            previous anesthesia was also reviewed. The risks                            and benefits of the procedure and the sedation                            options and risks were discussed with the patient.                            All questions were answered, and informed consent                            was obtained. Prior Anticoagulants: The patient has                            taken no previous anticoagulant or antiplatelet                            agents except for aspirin. ASA Grade Assessment:                            III - A patient with severe systemic disease. After  reviewing the risks and benefits, the patient was                            deemed in satisfactory condition to undergo the                            procedure.                           After obtaining informed consent, the colonoscope                            was passed under direct vision. Throughout the                            procedure, the  patient's blood pressure, pulse, and                            oxygen saturations were monitored continuously. The                            [id number]-HQ190L ([id number]) scope was introduced through                            the anus and advanced to the the cecum, identified                            by appendiceal orifice and ileocecal valve. The                            colonoscopy was performed without difficulty. The                            patient tolerated the procedure well. The quality                            of the bowel preparation was good. The ileocecal                            valve, appendiceal orifice, and rectum were                            photographed. Scope In: 12:08:22 PM Scope Out: 12:26:21 PM Scope Withdrawal Time: 0 hours 13 minutes 26 seconds  Total Procedure Duration: 0 hours 17 minutes 59 seconds  Findings:      The perianal and digital rectal examinations were normal.      A 7 mm polyp was found in the proximal ascending colon. The polyp was       semi-pedunculated. The polyp was removed with a cold snare. Resection       and retrieval were complete. To stop active bleeding, one hemostatic       clip was successfully placed (MR conditional). There was no bleeding at       the end of the procedure.      Small and large-mouthed diverticula  were found in the sigmoid colon and       hepatic flexure.      The exam was otherwise normal throughout the examined colon.      External hemorrhoids were found during retroflexion. The hemorrhoids       were small. Impression:               - One 7 mm polyp in the proximal ascending colon,                            removed with a cold snare. Resected and retrieved.                            Clip (MR conditional) was placed.                           - Diverticulosis in the sigmoid colon and at the                            hepatic flexure.                           - External hemorrhoids. Moderate Sedation:       Per Anesthesia Care Recommendation:           - Patient has a contact number available for                            emergencies. The signs and symptoms of potential                            delayed complications were discussed with the                            patient. Return to normal activities tomorrow.                            Written discharge instructions were provided to the                            patient.                           - High fiber diet and diabetic (ADA) diet today.                           - Continue present medications.                           - No aspirin, ibuprofen, naproxen, or other                            non-steroidal anti-inflammatory drugs for 1 day.                           - Await pathology results.                           -  Repeat colonoscopy is recommended. The                            colonoscopy date will be determined after pathology                            results from today's exam become available for                            review. Procedure Code(s):        --- Professional ---                           [phone], Colonoscopy, flexible; with removal of                            tumor(s), polyp(s), or other lesion(s) by snare                            technique Diagnosis Code(s):        --- Professional ---                           Z12.11, Encounter for screening for malignant                            neoplasm of colon                           K63.5, Polyp of colon                           K64.4, Residual hemorrhoidal skin tags                           K57.30, Diverticulosis of large intestine without                            perforation or abscess without bleeding CPT copyright 2019 [employer]. All rights reserved. The codes documented in this report are preliminary and upon coder review may  be revised to meet current compliance requirements. Hildred Laser, MD Hildred Laser, MD 01/02/2021  12:35:18 PM This report has been signed electronically. Number of Addenda: 0

## 2021-01-02 NOTE — Discharge Instructions (Addendum)
Resume aspirin on 01/03/2021 Resume other medications as before Modified carb high-fiber diet No driving for 24 hours Remember you cannot have an MRI until Hemoclip has passed Physician will call with biopsy results.

## 2021-01-02 NOTE — Anesthesia Postprocedure Evaluation (Signed)
Anesthesia Post Note  Patient: [name]  Procedure(s) Performed: COLONOSCOPY WITH PROPOFOL POLYPECTOMY  Patient location during evaluation: Phase II Anesthesia Type: General Level of consciousness: awake and alert and oriented Pain management: pain level controlled Vital Signs Assessment: post-procedure vital signs reviewed and stable Respiratory status: spontaneous breathing, nonlabored ventilation and respiratory function stable Cardiovascular status: blood pressure returned to baseline and stable Postop Assessment: no apparent nausea or vomiting Anesthetic complications: no   No notable events documented.   Last Vitals:  Vitals:   01/02/21 1102 01/02/21 1232  BP: 103/81 95/70  Pulse: 81 73  Resp: 19 18  Temp: 36.7 C (!) 36.1 C  SpO2: 97% 98%    Last Pain:  Vitals:   01/02/21 1232  TempSrc: Axillary  PainSc: 0-No pain                 Farrell Broerman C Adrean Heitz

## 2021-01-04 ENCOUNTER — Encounter (HOSPITAL_COMMUNITY): Payer: Self-pay | Admitting: Internal Medicine

## 2021-01-04 LAB — SURGICAL PATHOLOGY

## 2021-01-08 ENCOUNTER — Encounter (INDEPENDENT_AMBULATORY_CARE_PROVIDER_SITE_OTHER): Payer: Self-pay | Admitting: *Deleted

## 2021-02-06 DIAGNOSIS — E782 Mixed hyperlipidemia: Secondary | ICD-10-CM | POA: Diagnosis not present

## 2021-02-06 DIAGNOSIS — N189 Chronic kidney disease, unspecified: Secondary | ICD-10-CM | POA: Diagnosis not present

## 2021-02-06 DIAGNOSIS — I10 Essential (primary) hypertension: Secondary | ICD-10-CM | POA: Diagnosis not present

## 2021-02-06 DIAGNOSIS — E1143 Type 2 diabetes mellitus with diabetic autonomic (poly)neuropathy: Secondary | ICD-10-CM | POA: Diagnosis not present

## 2021-02-07 DIAGNOSIS — H5213 Myopia, bilateral: Secondary | ICD-10-CM | POA: Diagnosis not present

## 2021-02-07 DIAGNOSIS — H25813 Combined forms of age-related cataract, bilateral: Secondary | ICD-10-CM | POA: Diagnosis not present

## 2021-02-07 DIAGNOSIS — E119 Type 2 diabetes mellitus without complications: Secondary | ICD-10-CM | POA: Diagnosis not present

## 2021-02-07 DIAGNOSIS — H52223 Regular astigmatism, bilateral: Secondary | ICD-10-CM | POA: Diagnosis not present

## 2021-02-07 DIAGNOSIS — Z7984 Long term (current) use of oral hypoglycemic drugs: Secondary | ICD-10-CM | POA: Diagnosis not present

## 2021-02-07 DIAGNOSIS — H524 Presbyopia: Secondary | ICD-10-CM | POA: Diagnosis not present

## 2021-03-05 ENCOUNTER — Encounter: Payer: Self-pay | Admitting: *Deleted

## 2021-03-05 ENCOUNTER — Encounter: Payer: Self-pay | Admitting: Cardiology

## 2021-03-05 ENCOUNTER — Other Ambulatory Visit: Payer: Self-pay

## 2021-03-05 ENCOUNTER — Ambulatory Visit (INDEPENDENT_AMBULATORY_CARE_PROVIDER_SITE_OTHER): Payer: Medicare Other | Admitting: Cardiology

## 2021-03-05 VITALS — BP 114/70 | HR 107 | Ht 67.0 in | Wt 210.4 lb

## 2021-03-05 DIAGNOSIS — E782 Mixed hyperlipidemia: Secondary | ICD-10-CM

## 2021-03-05 DIAGNOSIS — I2510 Atherosclerotic heart disease of native coronary artery without angina pectoris: Secondary | ICD-10-CM

## 2021-03-05 DIAGNOSIS — R0602 Shortness of breath: Secondary | ICD-10-CM

## 2021-03-05 DIAGNOSIS — I10 Essential (primary) hypertension: Secondary | ICD-10-CM

## 2021-03-05 NOTE — Patient Instructions (Addendum)
Medication Instructions:  Continue all current medications.   Labwork: none  Testing/Procedures: none  Follow-Up: 6 months   Any Other Special Instructions Will Be Listed Below (If Applicable).   If you need a refill on your cardiac medications before your next appointment, please call your pharmacy.  

## 2021-03-05 NOTE — Progress Notes (Signed)
Clinical Summary [name] is a 70 y.o.female former patient of Dr Bronson Ing, this is our first visit together. Seen for the following medical problems.   1.CAD -history of inferior STEMI in 2018, ultimately had 4 vessel CABG LIMA to LAD, SVG to Diagonal, SVG to OM2, and SVG to PDA - echo at the time demonstrated normal left ventricular systolic function and regional wall motion, LVEF 60-65%, elevated filling pressures, and mild aortic and mitral regurgitation.  08/2020 echo LVEF 70-75%, no WMAs, indet diastolic, mild AI - compliant with meds    2.HTN - compliant with meds  3.Hyperlipidemia - compliant with meds - she is on atorvastatin    4.SOB - recent benign echo - history of tobacco. 2018 PFTs severe obstruction - some DOE with heavy lifting  Past Medical History:  Diagnosis Date   CAD, multiple vessel 11/27/2016   pRCA 60% &55%, dRCA 95% & 90% (very tortuous); ost LAD 65%, mLAD 90%, Ost D1 50% &60%, OM2 60%.  EF 55-65%, High LVEDP 32-35%, Mod (3+) MR   Chronic diastolic heart failure ([place]) 11/27/2016   Colon polyps    Diabetes mellitus without complication ([place])    Essential hypertension 11/27/2016   Hx of CABG 12/01/2016   LIMA-LAD, SVG-D1, SVG-OM2, SVG-rPDA   Hyperlipidemia with target low density lipoprotein (LDL) cholesterol less than 70 mg/dL 11/27/2016   Long-time heavy smoker    1 PPD x >40 yr   ST elevation myocardial infarction (STEMI) of inferior wall ([place]) 11/27/2016   - Aborted STEMI - EKG STE resolved after ASA & Heparin; MV CAD     No Known Allergies   Current Outpatient Medications  Medication Sig Dispense Refill   acetaminophen (TYLENOL) 325 MG tablet Take 650 mg by mouth every 6 (six) hours as needed for moderate pain.     aspirin EC 81 MG tablet Take 1 tablet (81 mg total) by mouth daily. 30 tablet 11   atorvastatin (LIPITOR) 80 MG tablet TAKE 1 TABLET (80 MG TOTAL) BY MOUTH DAILY AT 6 PM. 90 tablet 0   chlorthalidone (HYGROTON) 25  MG tablet Take 25 mg by mouth daily.     Cholecalciferol (VITAMIN D3 PO) Take 1 tablet by mouth daily.     Cyanocobalamin (VITAMIN B 12 PO) Take 1 tablet by mouth daily.     Homeopathic Products [facility] RELIEF EX) Apply 1-2 sprays topically daily as needed (leg pain).     lisinopril (ZESTRIL) 10 MG tablet Take 10 mg by mouth daily.     lisinopril (ZESTRIL) 5 MG tablet Take 1 tablet (5 mg total) by mouth daily. (Patient not taking: No sig reported) 90 tablet 3   MAGNESIUM PO Take 1 tablet by mouth daily.     metFORMIN (GLUCOPHAGE-XR) 500 MG 24 hr tablet Take 1,000 mg by mouth 2 (two) times daily.     metoprolol tartrate (LOPRESSOR) 25 MG tablet Take 1 tablet (25 mg total) by mouth 2 (two) times daily. (Patient taking differently: Take 25 mg by mouth daily.) 180 tablet 3   Omega-3 1000 MG CAPS Take 1,000 mg by mouth 2 (two) times daily.     potassium chloride SA (KLOR-CON) 20 MEQ tablet Take 20 mEq by mouth daily.     tiZANidine (ZANAFLEX) 2 MG tablet Take 2 mg by mouth at bedtime as needed for muscle spasms.     No current facility-administered medications for this visit.     Past Surgical History:  Procedure Laterality Date   ABDOMINAL  HYSTERECTOMY     COLONOSCOPY WITH PROPOFOL N/A 01/02/2021   Procedure: COLONOSCOPY WITH PROPOFOL;  Surgeon: Rogene Houston, MD;  [facility]: [facility];  Service: Endoscopy;  Laterality: N/A;  12:15   CORONARY ARTERY BYPASS GRAFT N/A 12/01/2016   Procedure: CORONARY ARTERY BYPASS GRAFTING (CABG) x four , using left internal mammary artery and right leg greater saphenous vein harvested endoscopically - LIMA to LAD, -SVG to DIAG1, SVG to OM2, SVG to PDA ;  Surgeon: Melrose Nakayama, MD;  Location: [place];  Service: Open Heart Surgery;  Laterality: N/A;   INTRAOPERATIVE TRANSESOPHAGEAL ECHOCARDIOGRAM N/A 12/01/2016   Procedure: INTRAOPERATIVE TRANSESOPHAGEAL ECHOCARDIOGRAM;  Surgeon: Melrose Nakayama, MD;  Location: [facility] OR;  Service: Open Heart  Surgery: EF 55-65%. No RWMA. MIld AI-- (post CABG, EF ~50%, Mod AI. MIld MR   LEFT HEART CATH AND CORONARY ANGIOGRAPHY N/A 11/27/2016   Procedure: LEFT HEART CATH AND CORONARY ANGIOGRAPHY;  Surgeon: Leonie Man, MD;  Location: [facility] IN[facility]: pRCA 60% &55%, dRCA 95% & 90% (very tortuous); ost LAD 65%, mLAD 90%, Ost D1 50% &60%, OM2 60%.  EF 55-65%, High LVEDP 32-35%, Mod (3+) MR   POLYPECTOMY  01/02/2021   Procedure: POLYPECTOMY;  Surgeon: Rogene Houston, MD;  Location: [facility];  Service: Endoscopy;;  ascending colon   TRANSTHORACIC ECHOCARDIOGRAM  11/27/2016   EF 60-65% but no RWMA. Diastolic dysfunction noted but not calculated     No Known Allergies    Family History  Problem Relation Age of Onset   Heart disease Brother      Social History Ms. Pyle reports that she quit smoking about 4 years ago. Her smoking use included cigarettes. She has a 42.00 pack-year smoking history. She has never used smokeless tobacco. Ms. Bartko reports no history of alcohol use.   Review of Systems CONSTITUTIONAL: No weight loss, fever, chills, weakness or fatigue.  HEENT: Eyes: No visual loss, blurred vision, double vision or yellow sclerae.No hearing loss, sneezing, congestion, runny nose or sore throat.  SKIN: No rash or itching.  CARDIOVASCULAR: per hpi RESPIRATORY: No shortness of breath, cough or sputum.  GASTROINTESTINAL: No anorexia, nausea, vomiting or diarrhea. No abdominal pain or blood.  GENITOURINARY: No burning on urination, no polyuria NEUROLOGICAL: No headache, dizziness, syncope, paralysis, ataxia, numbness or tingling in the extremities. No change in bowel or bladder control.  MUSCULOSKELETAL: No muscle, back pain, joint pain or stiffness.  LYMPHATICS: No enlarged nodes. No history of splenectomy.  PSYCHIATRIC: No history of depression or anxiety.  ENDOCRINOLOGIC: No reports of sweating, cold or heat intolerance. No polyuria or polydipsia.  Marland Kitchen   Physical  Examination Today's Vitals   03/05/21 1206  BP: 114/70  Pulse: (!) 107  SpO2: 98%  Weight: 210 lb 6.4 oz (95.4 kg)  Height: 5\' 7"  (1.702 m)   Body mass index is 32.95 kg/m.  Gen: resting comfortably, no acute distress HEENT: no scleral icterus, pupils equal round and reactive, no palptable cervical adenopathy,  CV: RRR, no m/rg no jvd Resp: Clear to auscultation bilaterally GI: abdomen is soft, non-tender, non-distended, normal bowel sounds, no hepatosplenomegaly MSK: extremities are warm, no edema.  Skin: warm, no rash Neuro:  no focal deficits Psych: appropriate affect   Diagnostic Studies  Cardiac Catheterization: 11/2016 Prox RCA-2 lesion, 60 %stenosed. Prox-mid RCA-1 lesion, 55 %stenosed. Dist RCA-2 lesion, 95 %stenosed. Dist RCA-1 lesion, 90 %stenosed. - This combination is likely culprit lesion, however with very tortuous RCA in a stable  patient did not feel it was a good idea to attempt PTCA at this time. Ost LAD lesion, 65 %stenosed. Mid LAD lesion, 90 %stenosed. Ost 1st Diag to 1st Diag lesion, 50 %stenosed. 1st Diag lesion, 60 %stenosed. Dist LAD lesion, 50 %stenosed. - This is beyond the 2nd Diag Suhana Wilner. 2nd Mrg lesion, 60 %stenosed. The left ventricular systolic function is normal. The left ventricular ejection fraction is 55-65% by visual estimate. LV end diastolic pressure is severely elevated. 32-35 mmhg There is moderate (3+) mitral regurgitation.   Very difficult situation with the patient has severe 2-3 vessel disease involving extensive disease in the distal RCA as well as ostial proximal and mid LAD. As she is now chest pain-free with resolution of her ST elevations, I did not feel like emergent attempted PCI on the RCA was a prudent choice of action. The tortuosity of the vessel and the eccentric nature of the lesions themselves make it a somewhat unfavorable vessel for PCI albeit possible if necessary.   The combination of the RCA in the extensive disease  in the LAD begins a question of whether she would potentially benefit from bypass surgery. I have contacted Dr. Roxan Hockey from [facility] and asked him to see the patient. I was informed that since the patient was not having active chest pain he can the patient will be seen tomorrow. There would be no OR date for tomorrow, so therefore can consider "cooling down over the weekend "with plan for next week.   With elevated LVEDP, concern for acute diastolic heart failure, I decided against balloon pump for afterload reduction in light of her likely not going for urgent surgery anytime soon.    Plan:  Admit to CCU. TR band removal.  Afterload/preload reduction with nitroglycerin drip  Will initiate Aggrastat infusion followed by heparin -- plan Aggrastat for at least 18 hours Reassess in the morning.  Check an echocardiogram based on concern for murmur on exam and MR on LV gram. Cannot exclude that this was catheter related however. I will start carvedilol and high-dose atorvastatin Check lipid panel and A1c Provided she stays stable overnight, I think we can avoid any further invasive procedures on her today. Pending CT consultation and review of films by interventional colleagues would determine whether we will proceed with multivessel PCI versus bypass surgery.    08/2020 echo 1. Left ventricular ejection fraction, by estimation, is 70 to 75%. The  left ventricle has hyperdynamic function. The left ventricle has no  regional wall motion abnormalities. Left ventricular diastolic parameters  are indeterminate.   2. Right ventricular systolic function is normal. The right ventricular  size is normal.   3. The mitral valve is normal in structure. No evidence of mitral valve  regurgitation. No evidence of mitral stenosis.   4. The aortic valve has an indeterminant number of cusps. There is mild  calcification of the aortic valve. There is mild thickening of the aortic  valve. Aortic valve  regurgitation is mild.      Assessment and Plan  1.CAD No recent symptoms, continue current meds  2. Hyperlipidemia - request pcp labs, continue atrovastatin  3. HTN - bp at goal, continue current meds  4. SOB - primarily with high levels of exertion, recent benign echo. I don't see strong reason to pursue ischemic testing.  - severe COPD by PFTs in 2018 not on therapy. We discussed pulmonary eval, she reports symptoms at this time not limiting and would like to defer at this  time.    Arnoldo Lenis, M.D.

## 2021-03-08 ENCOUNTER — Ambulatory Visit: Payer: Medicare Other | Admitting: Cardiology

## 2021-03-20 DIAGNOSIS — I10 Essential (primary) hypertension: Secondary | ICD-10-CM | POA: Diagnosis not present

## 2021-03-20 DIAGNOSIS — M179 Osteoarthritis of knee, unspecified: Secondary | ICD-10-CM | POA: Diagnosis not present

## 2021-03-20 DIAGNOSIS — Z0000 Encounter for general adult medical examination without abnormal findings: Secondary | ICD-10-CM | POA: Diagnosis not present

## 2021-03-20 DIAGNOSIS — N189 Chronic kidney disease, unspecified: Secondary | ICD-10-CM | POA: Diagnosis not present

## 2021-03-20 DIAGNOSIS — E782 Mixed hyperlipidemia: Secondary | ICD-10-CM | POA: Diagnosis not present

## 2021-03-20 DIAGNOSIS — E1143 Type 2 diabetes mellitus with diabetic autonomic (poly)neuropathy: Secondary | ICD-10-CM | POA: Diagnosis not present

## 2021-03-28 DIAGNOSIS — R948 Abnormal results of function studies of other organs and systems: Secondary | ICD-10-CM | POA: Diagnosis not present

## 2021-03-28 DIAGNOSIS — I739 Peripheral vascular disease, unspecified: Secondary | ICD-10-CM | POA: Diagnosis not present

## 2021-04-09 DIAGNOSIS — M179 Osteoarthritis of knee, unspecified: Secondary | ICD-10-CM | POA: Diagnosis not present

## 2021-04-09 DIAGNOSIS — I10 Essential (primary) hypertension: Secondary | ICD-10-CM | POA: Diagnosis not present

## 2021-04-09 DIAGNOSIS — E1143 Type 2 diabetes mellitus with diabetic autonomic (poly)neuropathy: Secondary | ICD-10-CM | POA: Diagnosis not present

## 2021-04-09 DIAGNOSIS — E782 Mixed hyperlipidemia: Secondary | ICD-10-CM | POA: Diagnosis not present

## 2021-04-10 ENCOUNTER — Ambulatory Visit (INDEPENDENT_AMBULATORY_CARE_PROVIDER_SITE_OTHER): Payer: Medicare Other | Admitting: Vascular Surgery

## 2021-04-10 ENCOUNTER — Encounter: Payer: Self-pay | Admitting: Vascular Surgery

## 2021-04-10 ENCOUNTER — Other Ambulatory Visit: Payer: Self-pay

## 2021-04-10 VITALS — BP 108/79 | HR 90 | Resp 24 | Ht 67.0 in | Wt 209.4 lb

## 2021-04-10 DIAGNOSIS — I739 Peripheral vascular disease, unspecified: Secondary | ICD-10-CM

## 2021-04-10 NOTE — Progress Notes (Signed)
[facility] [place]  Patient name: [name] MRN: [id number] DOB: [birth year] Sex: female  REASON FOR CONSULT: Discuss noninvasive studies showing lower extremity arterial insufficiency  HPI: [name] is a 71 y.o. female, who is here today for discussion of lower extremity symptoms and review of recent noninvasive studies.  She has a long history of cigarette smoking and stopped at the time of coronary bypass grafting in 2018.  She had endovascular vein harvest from her right thigh.  She has several different components of her lower extremity symptoms.  She reports a sensation of discomfort and achiness in her right posterior thigh that extends up into her right groin.  He also reports pain specifically in her right knee with walking.  She does have some numbness in her entire right leg and reports that at night sometimes she feels as though she cannot lift her right leg.  She does not have any specific symptoms in her left leg.  She has no history of lower extremity tissue loss.  She does not have any arterial claudication history.  She does have shortness of breath with exertion and this limits her walking.  Past Medical History:  Diagnosis Date   CAD, multiple vessel 11/27/2016   pRCA 60% &55%, dRCA 95% & 90% (very tortuous); ost LAD 65%, mLAD 90%, Ost D1 50% &60%, OM2 60%.  EF 55-65%, High LVEDP 32-35%, Mod (3+) MR   Chronic diastolic heart failure ([place]) 11/27/2016   Colon polyps    Diabetes mellitus without complication ([place])    Essential hypertension 11/27/2016   Hx of CABG 12/01/2016   LIMA-LAD, SVG-D1, SVG-OM2, SVG-rPDA   Hyperlipidemia with target low density lipoprotein (LDL) cholesterol less than 70 mg/dL 11/27/2016   Long-time heavy smoker    1 PPD x >40 yr   ST elevation myocardial infarction (STEMI) of inferior wall ([place]) 11/27/2016   - Aborted STEMI - EKG STE resolved after ASA & Heparin; MV CAD     Family History  Problem Relation Age of Onset   Heart disease Brother     SOCIAL HISTORY: Social History   Socioeconomic History   Marital status: Single    Spouse name: Not on file   Number of children: Not on file   Years of education: Not on file   Highest education level: Not on file  Occupational History   Not on file  Tobacco Use   Smoking status: Former    Packs/day: 1.00    Years: 42.00    Pack years: 42.00    Types: Cigarettes    Quit date: 01/07/2017    Years since quitting: 4.2   Smokeless tobacco: Never  Vaping Use   Vaping Use: Never used  Substance and Sexual Activity   Alcohol use: No   Drug use: No   Sexual activity: Not on file  Other Topics Concern   Not on file  Social History Narrative   Not on file   Social Determinants of Health   Financial Resource Strain: Not on file  Food Insecurity: Not on file  Transportation Needs: Not on file  Physical Activity: Not on file  Stress: Not on file  Social Connections: Not on file  Intimate Partner Violence: Not on file    No Known Allergies  Current Outpatient Medications  Medication Sig Dispense Refill   acetaminophen (TYLENOL) 325 MG tablet Take 650 mg by mouth every 6 (six) hours as needed for moderate pain.  aspirin EC 81 MG tablet Take 1 tablet (81 mg total) by mouth daily. 30 tablet 11   atorvastatin (LIPITOR) 80 MG tablet TAKE 1 TABLET (80 MG TOTAL) BY MOUTH DAILY AT 6 PM. 90 tablet 0   chlorthalidone (HYGROTON) 25 MG tablet Take 25 mg by mouth daily.     Cholecalciferol (VITAMIN D3 PO) Take 1 tablet by mouth daily.     Cyanocobalamin (VITAMIN B 12 PO) Take 1 tablet by mouth daily.     Homeopathic Products [facility] RELIEF EX) Apply 1-2 sprays topically daily as needed (leg pain).     lisinopril (ZESTRIL) 5 MG tablet Take 1 tablet (5 mg total) by mouth daily. (Patient not taking: No sig reported) 90 tablet 3   MAGNESIUM PO Take 1 tablet by mouth daily.     metFORMIN (GLUCOPHAGE-XR)  500 MG 24 hr tablet Take 1,000 mg by mouth 2 (two) times daily.     metoprolol tartrate (LOPRESSOR) 25 MG tablet Take 1 tablet (25 mg total) by mouth 2 (two) times daily. (Patient taking differently: Take 25 mg by mouth daily.) 180 tablet 3   Omega-3 1000 MG CAPS Take 1,000 mg by mouth 2 (two) times daily.     potassium chloride SA (KLOR-CON) 20 MEQ tablet Take 20 mEq by mouth daily.     tiZANidine (ZANAFLEX) 2 MG tablet Take 2 mg by mouth at bedtime as needed for muscle spasms.     No current facility-administered medications for this visit.    REVIEW OF SYSTEMS:  [X]  denotes positive finding, [ ]  denotes negative finding Cardiac  Comments:  Chest pain or chest pressure:    Shortness of breath upon exertion: x   Short of breath when lying flat:    Irregular heart rhythm:        Vascular    Pain in calf, thigh, or hip brought on by ambulation: x   Pain in feet at night that wakes you up from your sleep:  x   Blood clot in your veins:    Leg swelling:         Pulmonary    Oxygen at home:    Productive cough:     Wheezing:         Neurologic    Sudden weakness in arms or legs:     Sudden numbness in arms or legs:     Sudden onset of difficulty speaking or slurred speech:    Temporary loss of vision in one eye:     Problems with dizziness:         Gastrointestinal    Blood in stool:     Vomited blood:         Genitourinary    Burning when urinating:     Blood in urine:        Psychiatric    Major depression:         Hematologic    Bleeding problems:    Problems with blood clotting too easily:        Skin    Rashes or ulcers:        Constitutional    Fever or chills:      PHYSICAL EXAM: Vitals:   04/10/21 1428  BP: 108/79  Pulse: 90  Resp: (!) 24  SpO2: 97%  Weight: 209 lb 6.4 oz (95 kg)  Height: 5\' 7"  (1.702 m)    GENERAL: The patient is a well-nourished female, in no acute distress. The vital signs are documented  above. CARDIOVASCULAR: 2+ radial and  2+ femoral pulses bilaterally.  I do not palpate popliteal pulses.  She has a 1-2+ left dorsalis pedis pulse I do not palpate a right pedal pulse PULMONARY: There is good air exchange  MUSCULOSKELETAL: There are no major deformities or cyanosis. NEUROLOGIC: No focal weakness or paresthesias are detected. SKIN: There are no ulcers or rashes noted. PSYCHIATRIC: The patient has a normal affect.  DATA:  Noninvasive studies from 03/28/2021 were reviewed with her.  This reveals a 0.78 ankle arm index on the right with biphasic waveforms.  On the left her ankle arm index is 0.72 with a biphasic to triphasic waveforms.  MEDICAL ISSUES: I discussed the significance of this with the patient.  She has mild lower extremity arterial insufficiency based on noninvasive studies.  Her right leg symptoms are not consistent with claudication or rest pain.  These do appear to be more neurogenic.  I reassured her that she is at no risk for tissue loss and would recommend continued walking program is much as she can.  She will see [place] again on an as-needed basis   Rosetta Posner, MD [facility] Tel [phone] Pager [phone]  Note: Portions of this report may have been transcribed using voice recognition software.  Every effort has been made to ensure accuracy; however, inadvertent computerized transcription errors may still be present.

## 2021-05-23 ENCOUNTER — Other Ambulatory Visit: Payer: Self-pay | Admitting: *Deleted

## 2021-05-23 MED ORDER — METOPROLOL TARTRATE 25 MG PO TABS: 25.0000 mg | ORAL_TABLET | Freq: Two times a day (BID) | ORAL | 3 refills | Status: DC

## 2021-06-18 DIAGNOSIS — I10 Essential (primary) hypertension: Secondary | ICD-10-CM | POA: Diagnosis not present

## 2021-06-18 DIAGNOSIS — E1143 Type 2 diabetes mellitus with diabetic autonomic (poly)neuropathy: Secondary | ICD-10-CM | POA: Diagnosis not present

## 2021-06-18 DIAGNOSIS — E782 Mixed hyperlipidemia: Secondary | ICD-10-CM | POA: Diagnosis not present

## 2021-06-18 DIAGNOSIS — Z0000 Encounter for general adult medical examination without abnormal findings: Secondary | ICD-10-CM | POA: Diagnosis not present

## 2021-06-18 DIAGNOSIS — M5431 Sciatica, right side: Secondary | ICD-10-CM | POA: Diagnosis not present

## 2021-06-18 DIAGNOSIS — N189 Chronic kidney disease, unspecified: Secondary | ICD-10-CM | POA: Diagnosis not present

## 2021-06-18 DIAGNOSIS — M179 Osteoarthritis of knee, unspecified: Secondary | ICD-10-CM | POA: Diagnosis not present

## 2021-06-24 DIAGNOSIS — M5431 Sciatica, right side: Secondary | ICD-10-CM | POA: Diagnosis not present

## 2021-06-24 DIAGNOSIS — M6281 Muscle weakness (generalized): Secondary | ICD-10-CM | POA: Diagnosis not present

## 2021-06-24 DIAGNOSIS — M545 Low back pain, unspecified: Secondary | ICD-10-CM | POA: Diagnosis not present

## 2021-06-26 DIAGNOSIS — M6281 Muscle weakness (generalized): Secondary | ICD-10-CM | POA: Diagnosis not present

## 2021-06-26 DIAGNOSIS — M5431 Sciatica, right side: Secondary | ICD-10-CM | POA: Diagnosis not present

## 2021-06-26 DIAGNOSIS — M545 Low back pain, unspecified: Secondary | ICD-10-CM | POA: Diagnosis not present

## 2021-06-28 DIAGNOSIS — M5431 Sciatica, right side: Secondary | ICD-10-CM | POA: Diagnosis not present

## 2021-06-28 DIAGNOSIS — M6281 Muscle weakness (generalized): Secondary | ICD-10-CM | POA: Diagnosis not present

## 2021-06-28 DIAGNOSIS — M545 Low back pain, unspecified: Secondary | ICD-10-CM | POA: Diagnosis not present

## 2021-07-04 DIAGNOSIS — M6281 Muscle weakness (generalized): Secondary | ICD-10-CM | POA: Diagnosis not present

## 2021-07-04 DIAGNOSIS — M5431 Sciatica, right side: Secondary | ICD-10-CM | POA: Diagnosis not present

## 2021-07-04 DIAGNOSIS — M545 Low back pain, unspecified: Secondary | ICD-10-CM | POA: Diagnosis not present

## 2021-07-09 DIAGNOSIS — M545 Low back pain, unspecified: Secondary | ICD-10-CM | POA: Diagnosis not present

## 2021-07-09 DIAGNOSIS — M6281 Muscle weakness (generalized): Secondary | ICD-10-CM | POA: Diagnosis not present

## 2021-07-09 DIAGNOSIS — M5431 Sciatica, right side: Secondary | ICD-10-CM | POA: Diagnosis not present

## 2021-07-25 ENCOUNTER — Encounter: Payer: Self-pay | Admitting: Cardiology

## 2021-07-31 DIAGNOSIS — M10072 Idiopathic gout, left ankle and foot: Secondary | ICD-10-CM | POA: Diagnosis not present

## 2021-09-05 ENCOUNTER — Ambulatory Visit (INDEPENDENT_AMBULATORY_CARE_PROVIDER_SITE_OTHER): Payer: Medicare Other | Admitting: Cardiology

## 2021-09-05 ENCOUNTER — Encounter: Payer: Self-pay | Admitting: *Deleted

## 2021-09-05 ENCOUNTER — Encounter: Payer: Self-pay | Admitting: Cardiology

## 2021-09-05 VITALS — BP 114/70 | HR 90 | Ht 67.0 in | Wt 206.2 lb

## 2021-09-05 DIAGNOSIS — I739 Peripheral vascular disease, unspecified: Secondary | ICD-10-CM | POA: Diagnosis not present

## 2021-09-05 DIAGNOSIS — I10 Essential (primary) hypertension: Secondary | ICD-10-CM

## 2021-09-05 DIAGNOSIS — I2510 Atherosclerotic heart disease of native coronary artery without angina pectoris: Secondary | ICD-10-CM

## 2021-09-05 DIAGNOSIS — E782 Mixed hyperlipidemia: Secondary | ICD-10-CM | POA: Diagnosis not present

## 2021-09-05 MED ORDER — METOPROLOL SUCCINATE ER 25 MG PO TB24: 25.0000 mg | ORAL_TABLET | Freq: Every day | ORAL | 3 refills | Status: AC

## 2021-09-05 NOTE — Patient Instructions (Signed)
Medication Instructions:  Stop Lopressor (Metoprolol Tart) Begin Toprol XL '25mg'$  daily  Continue all other medications.     Labwork: none  Testing/Procedures: none  Follow-Up: 6 months   Any Other Special Instructions Will Be Listed Below (If Applicable).   If you need a refill on your cardiac medications before your next appointment, please call your pharmacy.

## 2021-09-05 NOTE — Addendum Note (Signed)
Addended by: Laurine Blazer on: 09/05/2021 02:36 PM   Modules accepted: Orders

## 2021-09-05 NOTE — Progress Notes (Signed)
Clinical Summary Ms. Gullikson is a 71 y.o.female seen today for follow up of the following medical problems.   1.CAD -history of inferior STEMI in 2018, ultimately had 4 vessel CABG LIMA to LAD, SVG to Diagonal, SVG to OM2, and SVG to PDA - echo at the time demonstrated normal left ventricular systolic function and regional wall motion, LVEF 60-65%, elevated filling pressures, and mild aortic and mitral regurgitation.   08/2020 echo LVEF 70-75%, no WMAs, indet diastolic, mild AI - compliant with meds     - no chest pains. No SOB/DOE - compliant with meds. Difficult taking lopressor bid.     2.HTN - compliant with meds   3.Hyperlipidemia - labs followed by pcp - she is on atorvastatin '80mg'$  daily.       4.Tobacco abuse - history of tobacco. 2018 PFTs severe obstruction   5. PAD - abnormal ABIs ordered by pcp, Jan 2023 right 0.78 left 0.72 - referred to Dr Early vascular, plan to monitor and f/u just as needed - she is on aspirin, statin       Past Medical History:  Diagnosis Date   CAD, multiple vessel 11/27/2016   pRCA 60% &55%, dRCA 95% & 90% (very tortuous); ost LAD 65%, mLAD 90%, Ost D1 50% &60%, OM2 60%.  EF 55-65%, High LVEDP 32-35%, Mod (3+) MR   Chronic diastolic heart failure ([place]) 11/27/2016   Colon polyps    Diabetes mellitus without complication ([place])    Essential hypertension 11/27/2016   Hx of CABG 12/01/2016   LIMA-LAD, SVG-D1, SVG-OM2, SVG-rPDA   Hyperlipidemia with target low density lipoprotein (LDL) cholesterol less than 70 mg/dL 11/27/2016   Long-time heavy smoker    1 PPD x >40 yr   ST elevation myocardial infarction (STEMI) of inferior wall ([place]) 11/27/2016   - Aborted STEMI - EKG STE resolved after ASA & Heparin; MV CAD     No Known Allergies   Current Outpatient Medications  Medication Sig Dispense Refill   acetaminophen (TYLENOL) 325 MG tablet Take 650 mg by mouth every 6 (six) hours as needed for moderate pain.     aspirin  EC 81 MG tablet Take 1 tablet (81 mg total) by mouth daily. 30 tablet 11   atorvastatin (LIPITOR) 80 MG tablet TAKE 1 TABLET (80 MG TOTAL) BY MOUTH DAILY AT 6 PM. 90 tablet 0   Cholecalciferol (VITAMIN D3 PO) Take 1 tablet by mouth daily.     Cyanocobalamin (VITAMIN B 12 PO) Take 1 tablet by mouth daily.     Homeopathic Products [facility] RELIEF EX) Apply 1-2 sprays topically daily as needed (leg pain).     MAGNESIUM PO Take 1 tablet by mouth daily.     metFORMIN (GLUCOPHAGE-XR) 500 MG 24 hr tablet Take 1,000 mg by mouth 2 (two) times daily.     metoprolol tartrate (LOPRESSOR) 25 MG tablet Take 1 tablet (25 mg total) by mouth 2 (two) times daily. 180 tablet 3   Omega-3 1000 MG CAPS Take 1,000 mg by mouth 2 (two) times daily.     potassium chloride SA (KLOR-CON) 20 MEQ tablet Take 20 mEq by mouth daily.     chlorthalidone (HYGROTON) 25 MG tablet Take 25 mg by mouth daily.     JARDIANCE 10 MG TABS tablet Take 10 mg by mouth every morning.     lisinopril (ZESTRIL) 20 MG tablet Take 20 mg by mouth daily.     No current facility-administered medications for this visit.  Past Surgical History:  Procedure Laterality Date   ABDOMINAL HYSTERECTOMY     COLONOSCOPY WITH PROPOFOL N/A 01/02/2021   Procedure: COLONOSCOPY WITH PROPOFOL;  Surgeon: Rogene Houston, MD;  [facility];  Service: Endoscopy;  Laterality: N/A;  12:15   CORONARY ARTERY BYPASS GRAFT N/A 12/01/2016   Procedure: CORONARY ARTERY BYPASS GRAFTING (CABG) x four , using left internal mammary artery and right leg greater saphenous vein harvested endoscopically - LIMA to LAD, -SVG to DIAG1, SVG to OM2, SVG to PDA ;  Surgeon: Melrose Nakayama, MD;  Location: [place];  Service: Open Heart Surgery;  Laterality: N/A;   INTRAOPERATIVE TRANSESOPHAGEAL ECHOCARDIOGRAM N/A 12/01/2016   Procedure: INTRAOPERATIVE TRANSESOPHAGEAL ECHOCARDIOGRAM;  Surgeon: Melrose Nakayama, MD;  Location: [facility] OR;  Service: Open Heart Surgery: EF  55-65%. No RWMA. MIld AI-- (post CABG, EF ~50%, Mod AI. MIld MR   LEFT HEART CATH AND CORONARY ANGIOGRAPHY N/A 11/27/2016   Procedure: LEFT HEART CATH AND CORONARY ANGIOGRAPHY;  Surgeon: Leonie Man, MD;  [facility]: pRCA 60% &55%, dRCA 95% & 90% (very tortuous); ost LAD 65%, mLAD 90%, Ost D1 50% &60%, OM2 60%.  EF 55-65%, High LVEDP 32-35%, Mod (3+) MR   POLYPECTOMY  01/02/2021   Procedure: POLYPECTOMY;  Surgeon: Rogene Houston, MD;  [facility];  Service: Endoscopy;;  ascending colon   TRANSTHORACIC ECHOCARDIOGRAM  11/27/2016   EF 60-65% but no RWMA. Diastolic dysfunction noted but not calculated     No Known Allergies    Family History  Problem Relation Age of Onset   Heart disease Brother      Social History Ms. Sprankle reports that she quit smoking about 4 years ago. Her smoking use included cigarettes. She has a 42.00 pack-year smoking history. She has never used smokeless tobacco. Ms. Rehmann reports no history of alcohol use.   Review of Systems CONSTITUTIONAL: No weight loss, fever, chills, weakness or fatigue.  HEENT: Eyes: No visual loss, blurred vision, double vision or yellow sclerae.No hearing loss, sneezing, congestion, runny nose or sore throat.  SKIN: No rash or itching.  CARDIOVASCULAR: per hpi RESPIRATORY: No shortness of breath, cough or sputum.  GASTROINTESTINAL: No anorexia, nausea, vomiting or diarrhea. No abdominal pain or blood.  GENITOURINARY: No burning on urination, no polyuria NEUROLOGICAL: No headache, dizziness, syncope, paralysis, ataxia, numbness or tingling in the extremities. No change in bowel or bladder control.  MUSCULOSKELETAL: No muscle, back pain, joint pain or stiffness.  LYMPHATICS: No enlarged nodes. No history of splenectomy.  PSYCHIATRIC: No history of depression or anxiety.  ENDOCRINOLOGIC: No reports of sweating, cold or heat intolerance. No polyuria or polydipsia.  Marland Kitchen   Physical  Examination There were no vitals filed for this visit. Filed Weights   09/05/21 1403  Weight: 206 lb 3.2 oz (93.5 kg)    Gen: resting comfortably, no acute distress HEENT: no scleral icterus, pupils equal round and reactive, no palptable cervical adenopathy,  CV: RRR, no m/r/ gno jvd Resp: Clear to auscultation bilaterally GI: abdomen is soft, non-tender, non-distended, normal bowel sounds, no hepatosplenomegaly MSK: extremities are warm, no edema.  Skin: warm, no rash Neuro:  no focal deficits Psych: appropriate affect   Diagnostic Studies  Cardiac Catheterization: 11/2016 Prox RCA-2 lesion, 60 %stenosed. Prox-mid RCA-1 lesion, 55 %stenosed. Dist RCA-2 lesion, 95 %stenosed. Dist RCA-1 lesion, 90 %stenosed. - This combination is likely culprit lesion, however with very tortuous RCA in a stable patient did not feel  it was a good idea to attempt PTCA at this time. Ost LAD lesion, 65 %stenosed. Mid LAD lesion, 90 %stenosed. Ost 1st Diag to 1st Diag lesion, 50 %stenosed. 1st Diag lesion, 60 %stenosed. Dist LAD lesion, 50 %stenosed. - This is beyond the 2nd Diag Inocencia Murtaugh. 2nd Mrg lesion, 60 %stenosed. The left ventricular systolic function is normal. The left ventricular ejection fraction is 55-65% by visual estimate. LV end diastolic pressure is severely elevated. 32-35 mmhg There is moderate (3+) mitral regurgitation.   Very difficult situation with the patient has severe 2-3 vessel disease involving extensive disease in the distal RCA as well as ostial proximal and mid LAD. As she is now chest pain-free with resolution of her ST elevations, I did not feel like emergent attempted PCI on the RCA was a prudent choice of action. The tortuosity of the vessel and the eccentric nature of the lesions themselves make it a somewhat unfavorable vessel for PCI albeit possible if necessary.   The combination of the RCA in the extensive disease in the LAD begins a question of whether she would  potentially benefit from bypass surgery. I have contacted Dr. Roxan Hockey from [place] [facility] and asked him to see the patient. I was informed that since the patient was not having active chest pain he can the patient will be seen tomorrow. There would be no OR date for tomorrow, so therefore can consider "cooling down over the weekend "with plan for next week.   With elevated LVEDP, concern for acute diastolic heart failure, I decided against balloon pump for afterload reduction in light of her likely not going for urgent surgery anytime soon.    Plan:  Admit to CCU. TR band removal.  Afterload/preload reduction with nitroglycerin drip  Will initiate Aggrastat infusion followed by heparin -- plan Aggrastat for at least 18 hours Reassess in the morning.  Check an echocardiogram based on concern for murmur on exam and MR on LV gram. Cannot exclude that this was catheter related however. I will start carvedilol and high-dose atorvastatin Check lipid panel and A1c Provided she stays stable overnight, I think we can avoid any further invasive procedures on her today. Pending CT consultation and review of films by interventional colleagues would determine whether we will proceed with multivessel PCI versus bypass surgery.    08/2020 echo 1. Left ventricular ejection fraction, by estimation, is 70 to 75%. The  left ventricle has hyperdynamic function. The left ventricle has no  regional wall motion abnormalities. Left ventricular diastolic parameters  are indeterminate.   2. Right ventricular systolic function is normal. The right ventricular  size is normal.   3. The mitral valve is normal in structure. No evidence of mitral valve  regurgitation. No evidence of mitral stenosis.   4. The aortic valve has an indeterminant number of cusps. There is mild  calcification of the aortic valve. There is mild thickening of the aortic  valve. Aortic valve regurgitation is mild.        Assessment and Plan    1.CAD - no symptoms, continue current meds   2. Hyperlipidemia - continue atorvastatin, request pcp labs   3. HTN - she is at goal,continue current meds   4. PAD - continue ASA, statin     Arnoldo Lenis, M.D.

## 2021-09-06 ENCOUNTER — Ambulatory Visit: Payer: Medicare Other | Admitting: Cardiology

## 2021-09-17 DIAGNOSIS — I10 Essential (primary) hypertension: Secondary | ICD-10-CM | POA: Diagnosis not present

## 2021-09-17 DIAGNOSIS — N189 Chronic kidney disease, unspecified: Secondary | ICD-10-CM | POA: Diagnosis not present

## 2021-09-17 DIAGNOSIS — E782 Mixed hyperlipidemia: Secondary | ICD-10-CM | POA: Diagnosis not present

## 2021-09-17 DIAGNOSIS — E1143 Type 2 diabetes mellitus with diabetic autonomic (poly)neuropathy: Secondary | ICD-10-CM | POA: Diagnosis not present

## 2021-09-17 DIAGNOSIS — M10072 Idiopathic gout, left ankle and foot: Secondary | ICD-10-CM | POA: Diagnosis not present

## 2021-12-31 DIAGNOSIS — E1143 Type 2 diabetes mellitus with diabetic autonomic (poly)neuropathy: Secondary | ICD-10-CM | POA: Diagnosis not present

## 2021-12-31 DIAGNOSIS — N189 Chronic kidney disease, unspecified: Secondary | ICD-10-CM | POA: Diagnosis not present

## 2021-12-31 DIAGNOSIS — E782 Mixed hyperlipidemia: Secondary | ICD-10-CM | POA: Diagnosis not present

## 2021-12-31 DIAGNOSIS — M10072 Idiopathic gout, left ankle and foot: Secondary | ICD-10-CM | POA: Diagnosis not present

## 2021-12-31 DIAGNOSIS — I10 Essential (primary) hypertension: Secondary | ICD-10-CM | POA: Diagnosis not present

## 2022-01-01 DIAGNOSIS — E782 Mixed hyperlipidemia: Secondary | ICD-10-CM | POA: Diagnosis not present

## 2022-01-01 DIAGNOSIS — I10 Essential (primary) hypertension: Secondary | ICD-10-CM | POA: Diagnosis not present

## 2022-01-01 DIAGNOSIS — N189 Chronic kidney disease, unspecified: Secondary | ICD-10-CM | POA: Diagnosis not present

## 2022-01-01 DIAGNOSIS — E1143 Type 2 diabetes mellitus with diabetic autonomic (poly)neuropathy: Secondary | ICD-10-CM | POA: Diagnosis not present

## 2022-01-01 DIAGNOSIS — R0602 Shortness of breath: Secondary | ICD-10-CM | POA: Diagnosis not present

## 2022-01-14 DIAGNOSIS — R931 Abnormal findings on diagnostic imaging of heart and coronary circulation: Secondary | ICD-10-CM | POA: Diagnosis not present

## 2022-01-14 DIAGNOSIS — R0602 Shortness of breath: Secondary | ICD-10-CM | POA: Diagnosis not present

## 2022-02-27 ENCOUNTER — Encounter: Payer: Self-pay | Admitting: Cardiology

## 2022-02-27 ENCOUNTER — Ambulatory Visit: Payer: Medicare Other | Attending: Cardiology | Admitting: Cardiology

## 2022-02-27 VITALS — BP 110/62 | HR 100 | Ht 67.0 in | Wt 200.2 lb

## 2022-02-27 DIAGNOSIS — I2510 Atherosclerotic heart disease of native coronary artery without angina pectoris: Secondary | ICD-10-CM | POA: Diagnosis not present

## 2022-02-27 DIAGNOSIS — E782 Mixed hyperlipidemia: Secondary | ICD-10-CM

## 2022-02-27 DIAGNOSIS — I739 Peripheral vascular disease, unspecified: Secondary | ICD-10-CM | POA: Diagnosis not present

## 2022-02-27 DIAGNOSIS — I10 Essential (primary) hypertension: Secondary | ICD-10-CM | POA: Diagnosis not present

## 2022-02-27 NOTE — Patient Instructions (Signed)
Medication Instructions:  Continue all current medications.   Labwork: none  Testing/Procedures: none  Follow-Up: 6 months   Any Other Special Instructions Will Be Listed Below (If Applicable).   If you need a refill on your cardiac medications before your next appointment, please call your pharmacy.  

## 2022-02-27 NOTE — Progress Notes (Signed)
Clinical Summary Ms. Hitson is a 71 y.o.female seen today for follow up of the following medical problems.    1.CAD -history of inferior STEMI in 2018, ultimately had 4 vessel CABG LIMA to LAD, SVG to Diagonal, SVG to OM2, and SVG to PDA - echo at the time demonstrated normal left ventricular systolic function and regional wall motion, LVEF 60-65%, elevated filling pressures, and mild aortic and mitral regurgitation.   08/2020 echo LVEF 70-75%, no WMAs, indet diastolic, mild AI  - no chest pains. Some SOB at times with carrying heavy objects and walking - 01/2022 nuclear stress: Small, mild fixed defect in the apex compatible with infarction  involving the LAD. No evidence of reversible ischemia.  - no recent chest pains    2.HTN - compliant with meds   3.Hyperlipidemia - labs followed by pcp - she is on atorvastatin '80mg'$  daily.     06/2021 TC 195 LDL 105 HDL 55 - upcoming labs with pcp   4.Tobacco abuse - history of tobacco. 2018 PFTs severe obstruction     5. PAD - abnormal ABIs ordered by pcp, Jan 2023 right 0.78 left 0.72 - referred to Dr Early vascular, plan to monitor and f/u just as needed - she is on aspirin, statin  - no claudication pain     Past Medical History:  Diagnosis Date   CAD, multiple vessel 11/27/2016   pRCA 60% &55%, dRCA 95% & 90% (very tortuous); ost LAD 65%, mLAD 90%, Ost D1 50% &60%, OM2 60%.  EF 55-65%, High LVEDP 32-35%, Mod (3+) MR   Chronic diastolic heart failure ([place]) 11/27/2016   Colon polyps    Diabetes mellitus without complication ([place])    Essential hypertension 11/27/2016   Hx of CABG 12/01/2016   LIMA-LAD, SVG-D1, SVG-OM2, SVG-rPDA   Hyperlipidemia with target low density lipoprotein (LDL) cholesterol less than 70 mg/dL 11/27/2016   Long-time heavy smoker    1 PPD x >40 yr   ST elevation myocardial infarction (STEMI) of inferior wall ([place]) 11/27/2016   - Aborted STEMI - EKG STE resolved after ASA & Heparin; MV CAD      No Known Allergies   Current Outpatient Medications  Medication Sig Dispense Refill   acetaminophen (TYLENOL) 325 MG tablet Take 650 mg by mouth every 6 (six) hours as needed for moderate pain.     aspirin EC 81 MG tablet Take 1 tablet (81 mg total) by mouth daily. 30 tablet 11   atorvastatin (LIPITOR) 80 MG tablet TAKE 1 TABLET (80 MG TOTAL) BY MOUTH DAILY AT 6 PM. 90 tablet 0   chlorthalidone (HYGROTON) 25 MG tablet Take 25 mg by mouth daily. (Patient not taking: Reported on 09/05/2021)     Cholecalciferol (VITAMIN D3 PO) Take 1 tablet by mouth daily. (Patient not taking: Reported on 09/05/2021)     Cyanocobalamin (VITAMIN B 12 PO) Take 1 tablet by mouth daily.     Homeopathic Products [facility] RELIEF EX) Apply 1-2 sprays topically daily as needed (leg pain).     JARDIANCE 10 MG TABS tablet Take 10 mg by mouth every morning.     lisinopril (ZESTRIL) 20 MG tablet Take 20 mg by mouth daily.     MAGNESIUM PO Take 1 tablet by mouth daily.     metFORMIN (GLUCOPHAGE-XR) 500 MG 24 hr tablet Take 1,000 mg by mouth 2 (two) times daily.     metoprolol succinate (TOPROL XL) 25 MG 24 hr tablet Take 1 tablet (25  mg total) by mouth daily. 90 tablet 3   Omega-3 1000 MG CAPS Take 1,000 mg by mouth 2 (two) times daily.     potassium chloride SA (KLOR-CON) 20 MEQ tablet Take 20 mEq by mouth daily.     No current facility-administered medications for this visit.     Past Surgical History:  Procedure Laterality Date   ABDOMINAL HYSTERECTOMY     COLONOSCOPY WITH PROPOFOL N/A 01/02/2021   Procedure: COLONOSCOPY WITH PROPOFOL;  Surgeon: Rogene Houston, MD;  [facility];  Service: Endoscopy;  Laterality: N/A;  12:15   CORONARY ARTERY BYPASS GRAFT N/A 12/01/2016   Procedure: CORONARY ARTERY BYPASS GRAFTING (CABG) x four , using left internal mammary artery and right leg greater saphenous vein harvested endoscopically - LIMA to LAD, -SVG to DIAG1, SVG to OM2, SVG to PDA ;  Surgeon:  Melrose Nakayama, MD;  Location: [place];  Service: Open Heart Surgery;  Laterality: N/A;   INTRAOPERATIVE TRANSESOPHAGEAL ECHOCARDIOGRAM N/A 12/01/2016   Procedure: INTRAOPERATIVE TRANSESOPHAGEAL ECHOCARDIOGRAM;  Surgeon: Melrose Nakayama, MD;  Location: [facility] OR;  Service: Open Heart Surgery: EF 55-65%. No RWMA. MIld AI-- (post CABG, EF ~50%, Mod AI. MIld MR   LEFT HEART CATH AND CORONARY ANGIOGRAPHY N/A 11/27/2016   Procedure: LEFT HEART CATH AND CORONARY ANGIOGRAPHY;  Surgeon: Leonie Man, MD;  [facility]: pRCA 60% &55%, dRCA 95% & 90% (very tortuous); ost LAD 65%, mLAD 90%, Ost D1 50% &60%, OM2 60%.  EF 55-65%, High LVEDP 32-35%, Mod (3+) MR   POLYPECTOMY  01/02/2021   Procedure: POLYPECTOMY;  Surgeon: Rogene Houston, MD;  [facility];  Service: Endoscopy;;  ascending colon   TRANSTHORACIC ECHOCARDIOGRAM  11/27/2016   EF 60-65% but no RWMA. Diastolic dysfunction noted but not calculated     No Known Allergies    Family History  Problem Relation Age of Onset   Heart disease Brother      Social History Ms. Norment reports that she quit smoking about 5 years ago. Her smoking use included cigarettes. She has a 42.00 pack-year smoking history. She has never used smokeless tobacco. Ms. Watanabe reports no history of alcohol use.   Review of Systems CONSTITUTIONAL: No weight loss, fever, chills, weakness or fatigue.  HEENT: Eyes: No visual loss, blurred vision, double vision or yellow sclerae.No hearing loss, sneezing, congestion, runny nose or sore throat.  SKIN: No rash or itching.  CARDIOVASCULAR: per hpi RESPIRATORY: No shortness of breath, cough or sputum.  GASTROINTESTINAL: No anorexia, nausea, vomiting or diarrhea. No abdominal pain or blood.  GENITOURINARY: No burning on urination, no polyuria NEUROLOGICAL: No headache, dizziness, syncope, paralysis, ataxia, numbness or tingling in the extremities. No change in bowel or bladder  control.  MUSCULOSKELETAL: No muscle, back pain, joint pain or stiffness.  LYMPHATICS: No enlarged nodes. No history of splenectomy.  PSYCHIATRIC: No history of depression or anxiety.  ENDOCRINOLOGIC: No reports of sweating, cold or heat intolerance. No polyuria or polydipsia.  Marland Kitchen   Physical Examination Today's Vitals   02/27/22 1542  BP: 110/62  Pulse: 100  SpO2: 95%  Weight: 200 lb 3.2 oz (90.8 kg)  Height: '5\' 7"'$  (1.702 m)   Body mass index is 31.36 kg/m.  Gen: resting comfortably, no acute distress HEENT: no scleral icterus, pupils equal round and reactive, no palptable cervical adenopathy,  CV: RRR, no mrg, no jvd Resp: Clear to auscultation bilaterally GI: abdomen is soft, non-tender, non-distended, normal bowel sounds,  no hepatosplenomegaly MSK: extremities are warm, no edema.  Skin: warm, no rash Neuro:  no focal deficits Psych: appropriate affect   Diagnostic Studies  Cardiac Catheterization: 11/2016 Prox RCA-2 lesion, 60 %stenosed. Prox-mid RCA-1 lesion, 55 %stenosed. Dist RCA-2 lesion, 95 %stenosed. Dist RCA-1 lesion, 90 %stenosed. - This combination is likely culprit lesion, however with very tortuous RCA in a stable patient did not feel it was a good idea to attempt PTCA at this time. Ost LAD lesion, 65 %stenosed. Mid LAD lesion, 90 %stenosed. Ost 1st Diag to 1st Diag lesion, 50 %stenosed. 1st Diag lesion, 60 %stenosed. Dist LAD lesion, 50 %stenosed. - This is beyond the 2nd Diag Charish Schroepfer. 2nd Mrg lesion, 60 %stenosed. The left ventricular systolic function is normal. The left ventricular ejection fraction is 55-65% by visual estimate. LV end diastolic pressure is severely elevated. 32-35 mmhg There is moderate (3+) mitral regurgitation.   Very difficult situation with the patient has severe 2-3 vessel disease involving extensive disease in the distal RCA as well as ostial proximal and mid LAD. As she is now chest pain-free with resolution of her ST elevations, I  did not feel like emergent attempted PCI on the RCA was a prudent choice of action. The tortuosity of the vessel and the eccentric nature of the lesions themselves make it a somewhat unfavorable vessel for PCI albeit possible if necessary.   The combination of the RCA in the extensive disease in the LAD begins a question of whether she would potentially benefit from bypass surgery. I have contacted Dr. Roxan Hockey from [place] [facility] and asked him to see the patient. I was informed that since the patient was not having active chest pain he can the patient will be seen tomorrow. There would be no OR date for tomorrow, so therefore can consider "cooling down over the weekend "with plan for next week.   With elevated LVEDP, concern for acute diastolic heart failure, I decided against balloon pump for afterload reduction in light of her likely not going for urgent surgery anytime soon.    Plan:  Admit to CCU. TR band removal.  Afterload/preload reduction with nitroglycerin drip  Will initiate Aggrastat infusion followed by heparin -- plan Aggrastat for at least 18 hours Reassess in the morning.  Check an echocardiogram based on concern for murmur on exam and MR on LV gram. Cannot exclude that this was catheter related however. I will start carvedilol and high-dose atorvastatin Check lipid panel and A1c Provided she stays stable overnight, I think we can avoid any further invasive procedures on her today. Pending CT consultation and review of films by interventional colleagues would determine whether we will proceed with multivessel PCI versus bypass surgery.    08/2020 echo 1. Left ventricular ejection fraction, by estimation, is 70 to 75%. The  left ventricle has hyperdynamic function. The left ventricle has no  regional wall motion abnormalities. Left ventricular diastolic parameters  are indeterminate.   2. Right ventricular systolic function is normal. The right ventricular  size is normal.   3.  The mitral valve is normal in structure. No evidence of mitral valve  regurgitation. No evidence of mitral stenosis.   4. The aortic valve has an indeterminant number of cusps. There is mild  calcification of the aortic valve. There is mild thickening of the aortic  valve. Aortic valve regurgitation is mild.        Assessment and Plan  1.CAD - no recent symptoms, continue current meds   2. Hyperlipidemia -  continue current meds, f/u pending labs.    3. HTN - at goal, continue current meds   4. PAD - no recent symptoms, continue current meds      Arnoldo Lenis, M.D.,

## 2022-04-07 DIAGNOSIS — H2513 Age-related nuclear cataract, bilateral: Secondary | ICD-10-CM | POA: Diagnosis not present

## 2022-04-07 DIAGNOSIS — E119 Type 2 diabetes mellitus without complications: Secondary | ICD-10-CM | POA: Diagnosis not present

## 2022-04-07 DIAGNOSIS — Z7984 Long term (current) use of oral hypoglycemic drugs: Secondary | ICD-10-CM | POA: Diagnosis not present

## 2022-04-08 DIAGNOSIS — E1143 Type 2 diabetes mellitus with diabetic autonomic (poly)neuropathy: Secondary | ICD-10-CM | POA: Diagnosis not present

## 2022-04-08 DIAGNOSIS — E782 Mixed hyperlipidemia: Secondary | ICD-10-CM | POA: Diagnosis not present

## 2022-04-08 DIAGNOSIS — I10 Essential (primary) hypertension: Secondary | ICD-10-CM | POA: Diagnosis not present

## 2022-04-08 DIAGNOSIS — N1831 Chronic kidney disease, stage 3a: Secondary | ICD-10-CM | POA: Diagnosis not present

## 2022-04-08 DIAGNOSIS — M10072 Idiopathic gout, left ankle and foot: Secondary | ICD-10-CM | POA: Diagnosis not present

## 2022-07-08 DIAGNOSIS — I10 Essential (primary) hypertension: Secondary | ICD-10-CM | POA: Diagnosis not present

## 2022-07-08 DIAGNOSIS — N1831 Chronic kidney disease, stage 3a: Secondary | ICD-10-CM | POA: Diagnosis not present

## 2022-07-08 DIAGNOSIS — E1143 Type 2 diabetes mellitus with diabetic autonomic (poly)neuropathy: Secondary | ICD-10-CM | POA: Diagnosis not present

## 2022-07-08 DIAGNOSIS — M10072 Idiopathic gout, left ankle and foot: Secondary | ICD-10-CM | POA: Diagnosis not present

## 2022-07-08 DIAGNOSIS — Z0000 Encounter for general adult medical examination without abnormal findings: Secondary | ICD-10-CM | POA: Diagnosis not present

## 2022-07-08 DIAGNOSIS — E782 Mixed hyperlipidemia: Secondary | ICD-10-CM | POA: Diagnosis not present

## 2022-07-11 DIAGNOSIS — Z0000 Encounter for general adult medical examination without abnormal findings: Secondary | ICD-10-CM | POA: Diagnosis not present

## 2022-07-11 DIAGNOSIS — E782 Mixed hyperlipidemia: Secondary | ICD-10-CM | POA: Diagnosis not present

## 2022-07-11 DIAGNOSIS — N1831 Chronic kidney disease, stage 3a: Secondary | ICD-10-CM | POA: Diagnosis not present

## 2022-07-11 DIAGNOSIS — M10072 Idiopathic gout, left ankle and foot: Secondary | ICD-10-CM | POA: Diagnosis not present

## 2022-07-11 DIAGNOSIS — E1143 Type 2 diabetes mellitus with diabetic autonomic (poly)neuropathy: Secondary | ICD-10-CM | POA: Diagnosis not present

## 2022-07-15 DIAGNOSIS — R1013 Epigastric pain: Secondary | ICD-10-CM | POA: Diagnosis not present

## 2022-07-18 DIAGNOSIS — R911 Solitary pulmonary nodule: Secondary | ICD-10-CM | POA: Diagnosis not present

## 2022-07-18 DIAGNOSIS — R0602 Shortness of breath: Secondary | ICD-10-CM | POA: Diagnosis not present

## 2022-07-18 DIAGNOSIS — R079 Chest pain, unspecified: Secondary | ICD-10-CM | POA: Diagnosis not present

## 2022-07-24 DIAGNOSIS — Z1231 Encounter for screening mammogram for malignant neoplasm of breast: Secondary | ICD-10-CM | POA: Diagnosis not present

## 2022-10-15 DIAGNOSIS — N1831 Chronic kidney disease, stage 3a: Secondary | ICD-10-CM | POA: Diagnosis not present

## 2022-10-15 DIAGNOSIS — E782 Mixed hyperlipidemia: Secondary | ICD-10-CM | POA: Diagnosis not present

## 2022-10-15 DIAGNOSIS — I10 Essential (primary) hypertension: Secondary | ICD-10-CM | POA: Diagnosis not present

## 2022-10-15 DIAGNOSIS — E1143 Type 2 diabetes mellitus with diabetic autonomic (poly)neuropathy: Secondary | ICD-10-CM | POA: Diagnosis not present

## 2022-10-15 DIAGNOSIS — Z0000 Encounter for general adult medical examination without abnormal findings: Secondary | ICD-10-CM | POA: Diagnosis not present

## 2022-10-15 DIAGNOSIS — M10072 Idiopathic gout, left ankle and foot: Secondary | ICD-10-CM | POA: Diagnosis not present

## 2022-10-21 ENCOUNTER — Ambulatory Visit: Payer: 59 | Attending: Nurse Practitioner | Admitting: Nurse Practitioner

## 2022-10-21 ENCOUNTER — Encounter: Payer: Self-pay | Admitting: Nurse Practitioner

## 2022-10-21 VITALS — BP 114/72 | HR 107 | Ht 67.0 in | Wt 185.8 lb

## 2022-10-21 DIAGNOSIS — E785 Hyperlipidemia, unspecified: Secondary | ICD-10-CM | POA: Diagnosis not present

## 2022-10-21 DIAGNOSIS — R29898 Other symptoms and signs involving the musculoskeletal system: Secondary | ICD-10-CM | POA: Diagnosis not present

## 2022-10-21 DIAGNOSIS — I5032 Chronic diastolic (congestive) heart failure: Secondary | ICD-10-CM

## 2022-10-21 DIAGNOSIS — I2510 Atherosclerotic heart disease of native coronary artery without angina pectoris: Secondary | ICD-10-CM

## 2022-10-21 DIAGNOSIS — I739 Peripheral vascular disease, unspecified: Secondary | ICD-10-CM | POA: Diagnosis not present

## 2022-10-21 DIAGNOSIS — N183 Chronic kidney disease, stage 3 unspecified: Secondary | ICD-10-CM | POA: Diagnosis not present

## 2022-10-21 MED ORDER — EZETIMIBE 10 MG PO TABS: 10.0000 mg | ORAL_TABLET | Freq: Every day | ORAL | 6 refills | Status: AC

## 2022-10-21 MED ORDER — JARDIANCE 10 MG PO TABS: 10.0000 mg | ORAL_TABLET | Freq: Every day | ORAL | 6 refills | Status: DC

## 2022-10-21 NOTE — Progress Notes (Unsigned)
Cardiology Office Note:  .   Date:  10/21/2022 ID:  [name], DOB 1950-11-12, MRN [id number] PCP: Toma Deiters, MD  [facility] Providers Cardiologist:  Dina Rich, MD    History of Present Illness: .   [name] is a 72 y.o. female with a PMH of CAD, s/p inferior STEMI in 2018 and requiring CABG x 4, PAD, hx of tobacco abuse, HLD, T2DM, chronic diastolic CHF, PAD, and CKD, who presents today for 6 month follow-up.   Last seen by Dr. Dina Rich on February 27, 2022. She was doing well at the time.   Today she presents for follow-up. She states she is doing well. Denies any chest pain, shortness of breath, palpitations, syncope, presyncope, dizziness, orthopnea, PND, swelling or significant weight changes, acute bleeding, or claudication. Does admit to leg weakness that appears to be chronic, previous ABI's 03/2021 with [facility] revealed moderate PAD (results reviewed under Care Everywhere). Weakness more noted along right leg than left leg. Tolerating her medications well.   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  Studies Reviewed: .    Echo 08/2020:  1. Left ventricular ejection fraction, by estimation, is 70 to 75%. The  left ventricle has hyperdynamic function. The left ventricle has no  regional wall motion abnormalities. Left ventricular diastolic parameters  are indeterminate.   2. Right ventricular systolic function is normal. The right ventricular  size is normal.   3. The mitral valve is normal in structure. No evidence of mitral valve  regurgitation. No evidence of mitral stenosis.   4. The aortic valve has an indeterminant number of cusps. There is mild  calcification of the aortic valve. There is mild thickening of the aortic  valve. Aortic valve regurgitation is mild.   Comparison(s): TEE done 12/01/16 showed an EF of 55-65%.  LHC 11/2016:  Prox RCA-2 lesion, 60 %stenosed. Prox-mid RCA-1 lesion, 55  %stenosed. Dist RCA-2 lesion, 95 %stenosed. Dist RCA-1 lesion, 90 %stenosed. - This combination is likely culprit lesion, however with very tortuous RCA in a stable patient did not feel it was a good idea to attempt PTCA at this time. Ost LAD lesion, 65 %stenosed. Mid LAD lesion, 90 %stenosed. Ost 1st Diag to 1st Diag lesion, 50 %stenosed. 1st Diag lesion, 60 %stenosed. Dist LAD lesion, 50 %stenosed. - This is beyond the 2nd Diag branch. 2nd Mrg lesion, 60 %stenosed. The left ventricular systolic function is normal. The left ventricular ejection fraction is 55-65% by visual estimate. LV end diastolic pressure is severely elevated. 32-35 mmhg There is moderate (3+) mitral regurgitation.   Very difficult situation with the patient has severe 2-3 vessel disease involving extensive disease in the distal RCA as well as ostial proximal and mid LAD. As she is now chest pain-free with resolution of her ST elevations, I did not feel like emergent attempted PCI on the RCA was a prudent choice of action. The tortuosity of the vessel and the eccentric nature of the lesions themselves make it a somewhat unfavorable vessel for PCI albeit possible if necessary.   The combination of the RCA in the extensive disease in the LAD begins a question of whether she would potentially benefit from bypass surgery. I have contacted Dr. Dorris Fetch from CT surgery and asked him to see the patient. I was informed that since the patient was not having active chest pain he can the patient will be seen tomorrow. There would be no OR date  for tomorrow, so therefore can consider "cooling down over the weekend "with plan for next week.   With elevated LVEDP, concern for acute diastolic heart failure, I decided against balloon pump for afterload reduction in light of her likely not going for urgent surgery anytime soon.    Plan:  Admit to CCU. TR band removal.  Afterload/preload reduction with nitroglycerin drip  Will initiate  Aggrastat infusion followed by heparin -- plan Aggrastat for at least 18 hours Reassess in the morning.  Check an echocardiogram based on concern for murmur on exam and MR on LV gram. Cannot exclude that this was catheter related however. I will start carvedilol and high-dose atorvastatin Check lipid panel and A1c Provided she stays stable overnight, I think we can avoid any further invasive procedures on her today. Pending CT consultation and review of films by interventional colleagues would determine whether we will proceed with multivessel PCI versus bypass surgery.  Physical Exam:   VS:  BP 114/72   Pulse (!) 107   Ht 5\' 7"  (1.702 m)   Wt 185 lb 12.8 oz (84.3 kg)   SpO2 95%   BMI 29.10 kg/m    Wt Readings from Last 3 Encounters:  10/21/22 185 lb 12.8 oz (84.3 kg)  02/27/22 200 lb 3.2 oz (90.8 kg)  09/05/21 206 lb 3.2 oz (93.5 kg)    GEN: Well nourished, well developed in no acute distress NECK: No JVD; No carotid bruits CARDIAC: S1/S2, fast rate and regular rhythm, no murmurs, rubs, gallops RESPIRATORY:  Clear to auscultation without rales, wheezing or rhonchi  ABDOMEN: Soft, non-tender, non-distended EXTREMITIES:  No edema; No deformity   ASSESSMENT AND PLAN: .    CAD, s/p CABG x 4 Stable with no anginal symptoms. No indication for ischemic evaluation.  Continue aspirin, atorvastatin, lisinopril, and Toprol XL. Heart healthy diet and regular cardiovascular exercise encouraged.   2. PAD, HLD, leg weakness Does admit to bilateral leg weakness, more in right than left lower extremity with evidence of moderate PAD along lower extremities. Will update ABI's and if abnormal, will arrange reflex arterial duplex of lower extremities. LDL 91 07/2022. Not at goal. LDL goal < 55. Will add Zetia 10 mg daily and she defers labs to her PCP who will check them in 2-3 months. Heart healthy diet and regular cardiovascular exercise encouraged.   3. Chronic diastolic CHF Stage C, NYHA class I  symptoms. EF 70-75%. Euvolemic and well compensated on exam.  No medication changes at this time. Low sodium diet, fluid restriction <2L, and daily weights encouraged. Educated to contact our office for weight gain of 2 lbs overnight or 5 lbs in one week. Will refill Jardiance today per her request.   4. CKD stage 3 Most recent labs show overall stable kidney disease. Avoid nephrotoxic agents. Encouraged adequate hydration. No medication changes. Continue to follow with PCP.  Dispo: Follow-up with Dr. Dina Rich or APP in 6 months or sooner if anything changes.   Signed, Sharlene Dory, NP

## 2022-10-21 NOTE — Patient Instructions (Signed)
Medication Instructions:   Begin Zetia 10mg  daily  Jardiance refilled today  Continue all other medications.     Labwork:  none  Testing/Procedures:  Your physician has requested that you have an ankle brachial index (ABI). During this test an ultrasound and blood pressure cuff are used to evaluate the arteries that supply the arms and legs with blood. Allow thirty minutes for this exam. There are no restrictions or special instructions.  Office will contact with results via phone, letter or mychart.     Follow-Up:  6 months   Any Other Special Instructions Will Be Listed Below (If Applicable).   If you need a refill on your cardiac medications before your next appointment, please call your pharmacy.

## 2022-11-06 ENCOUNTER — Ambulatory Visit: Payer: 59 | Attending: Nurse Practitioner

## 2022-11-06 DIAGNOSIS — I739 Peripheral vascular disease, unspecified: Secondary | ICD-10-CM

## 2022-11-06 DIAGNOSIS — R29898 Other symptoms and signs involving the musculoskeletal system: Secondary | ICD-10-CM | POA: Diagnosis not present

## 2022-11-06 LAB — VAS US ABI WITH/WO TBI
Left ABI: 0.74
Right ABI: 0.77

## 2022-11-11 ENCOUNTER — Encounter: Payer: Self-pay | Admitting: Nurse Practitioner

## 2022-11-12 ENCOUNTER — Telehealth: Payer: Self-pay | Admitting: Cardiology

## 2022-11-12 NOTE — Telephone Encounter (Signed)
Patient calling in about her results. Please advise

## 2022-11-12 NOTE — Telephone Encounter (Signed)
Patient aware of results [place] VAS ABI

## 2023-01-14 DIAGNOSIS — Z0000 Encounter for general adult medical examination without abnormal findings: Secondary | ICD-10-CM | POA: Diagnosis not present

## 2023-01-14 DIAGNOSIS — M10072 Idiopathic gout, left ankle and foot: Secondary | ICD-10-CM | POA: Diagnosis not present

## 2023-01-14 DIAGNOSIS — E782 Mixed hyperlipidemia: Secondary | ICD-10-CM | POA: Diagnosis not present

## 2023-01-14 DIAGNOSIS — N1831 Chronic kidney disease, stage 3a: Secondary | ICD-10-CM | POA: Diagnosis not present

## 2023-01-14 DIAGNOSIS — D6869 Other thrombophilia: Secondary | ICD-10-CM | POA: Diagnosis not present

## 2023-01-14 DIAGNOSIS — E1143 Type 2 diabetes mellitus with diabetic autonomic (poly)neuropathy: Secondary | ICD-10-CM | POA: Diagnosis not present

## 2023-01-14 DIAGNOSIS — I10 Essential (primary) hypertension: Secondary | ICD-10-CM | POA: Diagnosis not present

## 2023-02-02 DIAGNOSIS — R932 Abnormal findings on diagnostic imaging of liver and biliary tract: Secondary | ICD-10-CM | POA: Diagnosis not present

## 2023-02-02 DIAGNOSIS — R634 Abnormal weight loss: Secondary | ICD-10-CM | POA: Diagnosis not present

## 2023-02-02 DIAGNOSIS — R109 Unspecified abdominal pain: Secondary | ICD-10-CM | POA: Diagnosis not present

## 2023-02-02 DIAGNOSIS — N281 Cyst of kidney, acquired: Secondary | ICD-10-CM | POA: Diagnosis not present

## 2023-02-20 DIAGNOSIS — R634 Abnormal weight loss: Secondary | ICD-10-CM | POA: Diagnosis not present

## 2023-02-20 DIAGNOSIS — R109 Unspecified abdominal pain: Secondary | ICD-10-CM | POA: Diagnosis not present

## 2023-02-20 DIAGNOSIS — K5730 Diverticulosis of large intestine without perforation or abscess without bleeding: Secondary | ICD-10-CM | POA: Diagnosis not present

## 2023-02-20 DIAGNOSIS — N281 Cyst of kidney, acquired: Secondary | ICD-10-CM | POA: Diagnosis not present

## 2023-02-20 DIAGNOSIS — Z130 Encounter for screening for diseases of the blood and blood-forming organs and certain disorders involving the immune mechanism: Secondary | ICD-10-CM | POA: Diagnosis not present

## 2023-04-22 DIAGNOSIS — I700 Atherosclerosis of aorta: Secondary | ICD-10-CM | POA: Diagnosis not present

## 2023-04-22 DIAGNOSIS — E1122 Type 2 diabetes mellitus with diabetic chronic kidney disease: Secondary | ICD-10-CM | POA: Diagnosis not present

## 2023-04-22 DIAGNOSIS — N1831 Chronic kidney disease, stage 3a: Secondary | ICD-10-CM | POA: Diagnosis not present

## 2023-04-22 DIAGNOSIS — M10072 Idiopathic gout, left ankle and foot: Secondary | ICD-10-CM | POA: Diagnosis not present

## 2023-04-22 DIAGNOSIS — Z0000 Encounter for general adult medical examination without abnormal findings: Secondary | ICD-10-CM | POA: Diagnosis not present

## 2023-04-22 DIAGNOSIS — I10 Essential (primary) hypertension: Secondary | ICD-10-CM | POA: Diagnosis not present

## 2023-04-22 DIAGNOSIS — E782 Mixed hyperlipidemia: Secondary | ICD-10-CM | POA: Diagnosis not present

## 2023-05-04 ENCOUNTER — Ambulatory Visit: Payer: 59 | Attending: Cardiology | Admitting: Cardiology

## 2023-05-04 NOTE — Progress Notes (Deleted)
 Clinical Summary Ms. Allers is a 73 y.o.female  seen today for follow up of the following medical problems.    1.CAD -history of inferior STEMI in 2018, ultimately had 4 vessel CABG LIMA to LAD, SVG to Diagonal, SVG to OM2, and SVG to PDA - echo at the time demonstrated normal left ventricular systolic function and regional wall motion, LVEF 60-65%, elevated filling pressures, and mild aortic and mitral regurgitation.   08/2020 echo LVEF 70-75%, no WMAs, indet diastolic, mild AI   - no chest pains. Some SOB at times with carrying heavy objects and walking - 01/2022 nuclear stress: Small, mild fixed defect in the apex compatible with infarction  involving the LAD. No evidence of reversible ischemia.  - no recent chest pains     2.HTN - compliant with meds   3.Hyperlipidemia - labs followed by pcp - she is on atorvastatin 80mg  daily.     06/2021 TC 195 LDL 105 HDL 55 - upcoming labs with pcp   4.Tobacco abuse - history of tobacco. 2018 PFTs severe obstruction     5. PAD - abnormal ABIs ordered by pcp, Jan 2023 right 0.78 left 0.72 - referred to Dr Early vascular, plan to monitor and f/u just as needed - she is on aspirin, statin   - no claudication pain  10/2022 ABI: right 0.0.77, left 0.74   Past Medical History:  Diagnosis Date   CAD, multiple vessel 11/27/2016   pRCA 60% &55%, dRCA 95% & 90% (very tortuous); ost LAD 65%, mLAD 90%, Ost D1 50% &60%, OM2 60%.  EF 55-65%, High LVEDP 32-35%, Mod (3+) MR   Chronic diastolic heart failure (HCC) 11/27/2016   Colon polyps    Diabetes mellitus without complication (HCC)    Essential hypertension 11/27/2016   Hx of CABG 12/01/2016   LIMA-LAD, SVG-D1, SVG-OM2, SVG-rPDA   Hyperlipidemia with target low density lipoprotein (LDL) cholesterol less than 70 mg/dL 16/12/9602   Long-time heavy smoker    1 PPD x >40 yr   ST elevation myocardial infarction (STEMI) of inferior wall (HCC) 11/27/2016   - Aborted STEMI - EKG STE  resolved after ASA & Heparin; MV CAD     No Known Allergies   Current Outpatient Medications  Medication Sig Dispense Refill   acetaminophen (TYLENOL) 325 MG tablet Take 650 mg by mouth every 6 (six) hours as needed for moderate pain.     aspirin EC 81 MG tablet Take 1 tablet (81 mg total) by mouth daily. 30 tablet 11   atorvastatin (LIPITOR) 80 MG tablet TAKE 1 TABLET (80 MG TOTAL) BY MOUTH DAILY AT 6 PM. 90 tablet 0   chlorthalidone (HYGROTON) 25 MG tablet Take 25 mg by mouth daily.     Cholecalciferol (VITAMIN D3 PO) Take 1 tablet by mouth daily.     Cyanocobalamin (VITAMIN B 12 PO) Take 1 tablet by mouth daily.     ezetimibe (ZETIA) 10 MG tablet Take 1 tablet (10 mg total) by mouth daily. 30 tablet 6   Homeopathic Products (THERAWORX RELIEF EX) Apply 1-2 sprays topically daily as needed (leg pain).     JARDIANCE 10 MG TABS tablet Take 1 tablet (10 mg total) by mouth daily. 30 tablet 6   lisinopril (ZESTRIL) 20 MG tablet Take 20 mg by mouth daily.     MAGNESIUM PO Take 1 tablet by mouth daily. (Patient not taking: Reported on 02/27/2022)     metFORMIN (GLUCOPHAGE-XR) 500 MG 24 hr tablet Take  1,000 mg by mouth 2 (two) times daily.     metoprolol succinate (TOPROL XL) 25 MG 24 hr tablet Take 1 tablet (25 mg total) by mouth daily. 90 tablet 3   Omega-3 1000 MG CAPS Take 1,000 mg by mouth 2 (two) times daily.     oxybutynin (DITROPAN-XL) 10 MG 24 hr tablet Take 10 mg by mouth daily.     potassium chloride SA (KLOR-CON) 20 MEQ tablet Take 20 mEq by mouth daily.     No current facility-administered medications for this visit.     Past Surgical History:  Procedure Laterality Date   ABDOMINAL HYSTERECTOMY     COLONOSCOPY WITH PROPOFOL N/A 01/02/2021   Procedure: COLONOSCOPY WITH PROPOFOL;  Surgeon: Malissa Hippo, MD;  Location: [facility];  Service: Endoscopy;  Laterality: N/A;  12:15   CORONARY ARTERY BYPASS GRAFT N/A 12/01/2016   Procedure: CORONARY ARTERY BYPASS GRAFTING (CABG)  x four , using left internal mammary artery and right leg greater saphenous vein harvested endoscopically - LIMA to LAD, -SVG to DIAG1, SVG to OM2, SVG to PDA ;  Surgeon: Loreli Slot, MD;  Location: [facility] OR;  Service: Open Heart Surgery;  Laterality: N/A;   INTRAOPERATIVE TRANSESOPHAGEAL ECHOCARDIOGRAM N/A 12/01/2016   Procedure: INTRAOPERATIVE TRANSESOPHAGEAL ECHOCARDIOGRAM;  Surgeon: Loreli Slot, MD;  Location: [facility] OR;  Service: Open Heart Surgery: EF 55-65%. No RWMA. MIld AI-- (post CABG, EF ~50%, Mod AI. MIld MR   LEFT HEART CATH AND CORONARY ANGIOGRAPHY N/A 11/27/2016   Procedure: LEFT HEART CATH AND CORONARY ANGIOGRAPHY;  Surgeon: Marykay Lex, MD;  Location: [facility]: pRCA 60% &55%, dRCA 95% & 90% (very tortuous); ost LAD 65%, mLAD 90%, Ost D1 50% &60%, OM2 60%.  EF 55-65%, High LVEDP 32-35%, Mod (3+) MR   POLYPECTOMY  01/02/2021   Procedure: POLYPECTOMY;  Surgeon: Malissa Hippo, MD;  Location: [facility];  Service: Endoscopy;;  ascending colon   TRANSTHORACIC ECHOCARDIOGRAM  11/27/2016   EF 60-65% but no RWMA. Diastolic dysfunction noted but not calculated     No Known Allergies    Family History  Problem Relation Age of Onset   Heart disease Brother      Social History Ms. Saathoff reports that she quit smoking about 6 years ago. Her smoking use included cigarettes. She started smoking about 48 years ago. She has a 42 pack-year smoking history. She has never used smokeless tobacco. Ms. Bickhart reports no history of alcohol use.   Review of Systems CONSTITUTIONAL: No weight loss, fever, chills, weakness or fatigue.  HEENT: Eyes: No visual loss, blurred vision, double vision or yellow sclerae.No hearing loss, sneezing, congestion, runny nose or sore throat.  SKIN: No rash or itching.  CARDIOVASCULAR:  RESPIRATORY: No shortness of breath, cough or sputum.  GASTROINTESTINAL: No anorexia, nausea, vomiting or diarrhea. No abdominal pain or  blood.  GENITOURINARY: No burning on urination, no polyuria NEUROLOGICAL: No headache, dizziness, syncope, paralysis, ataxia, numbness or tingling in the extremities. No change in bowel or bladder control.  MUSCULOSKELETAL: No muscle, back pain, joint pain or stiffness.  LYMPHATICS: No enlarged nodes. No history of splenectomy.  PSYCHIATRIC: No history of depression or anxiety.  ENDOCRINOLOGIC: No reports of sweating, cold or heat intolerance. No polyuria or polydipsia.  Marland Kitchen   Physical Examination There were no vitals filed for this visit. There were no vitals filed for this visit.  Gen: resting comfortably, no acute distress HEENT: no scleral icterus, pupils equal round  and reactive, no palptable cervical adenopathy,  CV Resp: Clear to auscultation bilaterally GI: abdomen is soft, non-tender, non-distended, normal bowel sounds, no hepatosplenomegaly MSK: extremities are warm, no edema.  Skin: warm, no rash Neuro:  no focal deficits Psych: appropriate affect   Diagnostic Studies  Cardiac Catheterization: 11/2016 Prox RCA-2 lesion, 60 %stenosed. Prox-mid RCA-1 lesion, 55 %stenosed. Dist RCA-2 lesion, 95 %stenosed. Dist RCA-1 lesion, 90 %stenosed. - This combination is likely culprit lesion, however with very tortuous RCA in a stable patient did not feel it was a good idea to attempt PTCA at this time. Ost LAD lesion, 65 %stenosed. Mid LAD lesion, 90 %stenosed. Ost 1st Diag to 1st Diag lesion, 50 %stenosed. 1st Diag lesion, 60 %stenosed. Dist LAD lesion, 50 %stenosed. - This is beyond the 2nd Diag Ephram Kornegay. 2nd Mrg lesion, 60 %stenosed. The left ventricular systolic function is normal. The left ventricular ejection fraction is 55-65% by visual estimate. LV end diastolic pressure is severely elevated. 32-35 mmhg There is moderate (3+) mitral regurgitation.   Very difficult situation with the patient has severe 2-3 vessel disease involving extensive disease in the distal RCA as well as  ostial proximal and mid LAD. As she is now chest pain-free with resolution of her ST elevations, I did not feel like emergent attempted PCI on the RCA was a prudent choice of action. The tortuosity of the vessel and the eccentric nature of the lesions themselves make it a somewhat unfavorable vessel for PCI albeit possible if necessary.   The combination of the RCA in the extensive disease in the LAD begins a question of whether she would potentially benefit from bypass surgery. I have contacted Dr. Dorris Fetch from CT surgery and asked him to see the patient. I was informed that since the patient was not having active chest pain he can the patient will be seen tomorrow. There would be no OR date for tomorrow, so therefore can consider "cooling down over the weekend "with plan for next week.   With elevated LVEDP, concern for acute diastolic heart failure, I decided against balloon pump for afterload reduction in light of her likely not going for urgent surgery anytime soon.    Plan:  Admit to CCU. TR band removal.  Afterload/preload reduction with nitroglycerin drip  Will initiate Aggrastat infusion followed by heparin -- plan Aggrastat for at least 18 hours Reassess in the morning.  Check an echocardiogram based on concern for murmur on exam and MR on LV gram. Cannot exclude that this was catheter related however. I will start carvedilol and high-dose atorvastatin Check lipid panel and A1c Provided she stays stable overnight, I think we can avoid any further invasive procedures on her today. Pending CT consultation and review of films by interventional colleagues would determine whether we will proceed with multivessel PCI versus bypass surgery.    08/2020 echo 1. Left ventricular ejection fraction, by estimation, is 70 to 75%. The  left ventricle has hyperdynamic function. The left ventricle has no  regional wall motion abnormalities. Left ventricular diastolic parameters  are indeterminate.    2. Right ventricular systolic function is normal. The right ventricular  size is normal.   3. The mitral valve is normal in structure. No evidence of mitral valve  regurgitation. No evidence of mitral stenosis.   4. The aortic valve has an indeterminant number of cusps. There is mild  calcification of the aortic valve. There is mild thickening of the aortic  valve. Aortic valve regurgitation is mild.  Assessment and Plan  1.CAD - no recent symptoms, continue current meds   2. Hyperlipidemia -continue current meds, f/u pending labs.    3. HTN - at goal, continue current meds   4. PAD - no recent symptoms, continue current meds      Antoine Poche, M.D., F.A.C.C.

## 2023-07-29 DIAGNOSIS — E782 Mixed hyperlipidemia: Secondary | ICD-10-CM | POA: Diagnosis not present

## 2023-07-29 DIAGNOSIS — M10072 Idiopathic gout, left ankle and foot: Secondary | ICD-10-CM | POA: Diagnosis not present

## 2023-07-29 DIAGNOSIS — N1831 Chronic kidney disease, stage 3a: Secondary | ICD-10-CM | POA: Diagnosis not present

## 2023-07-29 DIAGNOSIS — I700 Atherosclerosis of aorta: Secondary | ICD-10-CM | POA: Diagnosis not present

## 2023-07-29 DIAGNOSIS — I10 Essential (primary) hypertension: Secondary | ICD-10-CM | POA: Diagnosis not present

## 2023-07-29 DIAGNOSIS — E1122 Type 2 diabetes mellitus with diabetic chronic kidney disease: Secondary | ICD-10-CM | POA: Diagnosis not present

## 2023-07-29 DIAGNOSIS — Z0000 Encounter for general adult medical examination without abnormal findings: Secondary | ICD-10-CM | POA: Diagnosis not present

## 2023-07-30 ENCOUNTER — Other Ambulatory Visit (HOSPITAL_COMMUNITY): Payer: Self-pay | Admitting: Internal Medicine

## 2023-07-30 DIAGNOSIS — M810 Age-related osteoporosis without current pathological fracture: Secondary | ICD-10-CM

## 2023-08-25 ENCOUNTER — Other Ambulatory Visit: Payer: Self-pay | Admitting: Nurse Practitioner

## 2023-09-01 NOTE — Telephone Encounter (Signed)
Pharmacy calling to check the status of refill

## 2023-10-29 DIAGNOSIS — I700 Atherosclerosis of aorta: Secondary | ICD-10-CM | POA: Diagnosis not present

## 2023-10-29 DIAGNOSIS — Z0000 Encounter for general adult medical examination without abnormal findings: Secondary | ICD-10-CM | POA: Diagnosis not present

## 2023-10-29 DIAGNOSIS — M10072 Idiopathic gout, left ankle and foot: Secondary | ICD-10-CM | POA: Diagnosis not present

## 2023-10-29 DIAGNOSIS — I10 Essential (primary) hypertension: Secondary | ICD-10-CM | POA: Diagnosis not present

## 2023-10-29 DIAGNOSIS — E1122 Type 2 diabetes mellitus with diabetic chronic kidney disease: Secondary | ICD-10-CM | POA: Diagnosis not present

## 2023-10-29 DIAGNOSIS — E782 Mixed hyperlipidemia: Secondary | ICD-10-CM | POA: Diagnosis not present

## 2023-10-29 DIAGNOSIS — N1831 Chronic kidney disease, stage 3a: Secondary | ICD-10-CM | POA: Diagnosis not present

## 2023-11-28 ENCOUNTER — Other Ambulatory Visit: Payer: Self-pay | Admitting: Cardiology

## 2023-12-30 DIAGNOSIS — N1831 Chronic kidney disease, stage 3a: Secondary | ICD-10-CM | POA: Diagnosis not present
